# Patient Record
Sex: Female | Born: 1945 | Race: White | Hispanic: No | Smoking: Never smoker
Health system: Southern US, Community
[De-identification: ages and names within clinical notes are randomized; demographics above are authoritative.]

## PROBLEM LIST (undated history)

## (undated) DIAGNOSIS — I4891 Unspecified atrial fibrillation: Principal | ICD-10-CM

## (undated) DIAGNOSIS — G43909 Migraine, unspecified, not intractable, without status migrainosus: Secondary | ICD-10-CM

## (undated) DIAGNOSIS — E785 Hyperlipidemia, unspecified: Secondary | ICD-10-CM

## (undated) DIAGNOSIS — G40909 Epilepsy, unspecified, not intractable, without status epilepticus: Secondary | ICD-10-CM

## (undated) DIAGNOSIS — Z853 Personal history of malignant neoplasm of breast: Secondary | ICD-10-CM

## (undated) DIAGNOSIS — E039 Hypothyroidism, unspecified: Secondary | ICD-10-CM

## (undated) DIAGNOSIS — R32 Unspecified urinary incontinence: Secondary | ICD-10-CM

## (undated) DIAGNOSIS — E876 Hypokalemia: Secondary | ICD-10-CM

## (undated) DIAGNOSIS — G4700 Insomnia, unspecified: Secondary | ICD-10-CM

## (undated) DIAGNOSIS — R000 Tachycardia, unspecified: Secondary | ICD-10-CM

## (undated) DIAGNOSIS — F411 Generalized anxiety disorder: Secondary | ICD-10-CM

## (undated) DIAGNOSIS — C50919 Malignant neoplasm of unspecified site of unspecified female breast: Secondary | ICD-10-CM

## (undated) DIAGNOSIS — C73 Malignant neoplasm of thyroid gland: Secondary | ICD-10-CM

## (undated) DIAGNOSIS — C55 Malignant neoplasm of uterus, part unspecified: Secondary | ICD-10-CM

## (undated) DIAGNOSIS — K219 Gastro-esophageal reflux disease without esophagitis: Secondary | ICD-10-CM

## (undated) DIAGNOSIS — E209 Hypoparathyroidism, unspecified: Secondary | ICD-10-CM

## (undated) DIAGNOSIS — F0150 Vascular dementia without behavioral disturbance: Secondary | ICD-10-CM

## (undated) DIAGNOSIS — I340 Nonrheumatic mitral (valve) insufficiency: Secondary | ICD-10-CM

## (undated) DIAGNOSIS — I456 Pre-excitation syndrome: Secondary | ICD-10-CM

## (undated) HISTORY — DX: Hyperlipidemia, unspecified: E78.5

## (undated) HISTORY — DX: Personal history of malignant neoplasm of breast: Z85.3

## (undated) HISTORY — DX: Epilepsy, unspecified, not intractable, without status epilepticus: G40.909

## (undated) HISTORY — DX: Hypothyroidism, unspecified: E03.9

## (undated) HISTORY — DX: Vascular dementia, unspecified severity, without behavioral disturbance, psychotic disturbance, mood disturbance, and anxiety: F01.50

## (undated) HISTORY — DX: Pre-excitation syndrome: I45.6

## (undated) HISTORY — DX: Gastro-esophageal reflux disease without esophagitis: K21.9

## (undated) HISTORY — DX: Migraine, unspecified, not intractable, without status migrainosus: G43.909

## (undated) HISTORY — DX: Hypoparathyroidism, unspecified: E20.9

## (undated) HISTORY — PX: NECK SURGERY: SHX720

## (undated) HISTORY — DX: Generalized anxiety disorder: F41.1

## (undated) HISTORY — DX: Malignant neoplasm of thyroid gland: C73

## (undated) HISTORY — DX: Insomnia, unspecified: G47.00

## (undated) HISTORY — DX: Tachycardia, unspecified: R00.0

## (undated) HISTORY — DX: Unspecified urinary incontinence: R32

## (undated) HISTORY — DX: Nonrheumatic mitral (valve) insufficiency: I34.0

## (undated) HISTORY — PX: ABDOMINAL HYSTERECTOMY: SHX81

## (undated) HISTORY — DX: Hypokalemia: E87.6

---

## 2011-10-26 DIAGNOSIS — Z853 Personal history of malignant neoplasm of breast: Secondary | ICD-10-CM | POA: Diagnosis not present

## 2012-02-08 DIAGNOSIS — E209 Hypoparathyroidism, unspecified: Secondary | ICD-10-CM | POA: Diagnosis not present

## 2012-02-08 DIAGNOSIS — R7989 Other specified abnormal findings of blood chemistry: Secondary | ICD-10-CM | POA: Diagnosis not present

## 2012-02-08 DIAGNOSIS — D649 Anemia, unspecified: Secondary | ICD-10-CM | POA: Diagnosis not present

## 2012-02-09 DIAGNOSIS — D649 Anemia, unspecified: Secondary | ICD-10-CM | POA: Diagnosis not present

## 2012-03-19 DIAGNOSIS — M1710 Unilateral primary osteoarthritis, unspecified knee: Secondary | ICD-10-CM | POA: Diagnosis not present

## 2012-04-05 DIAGNOSIS — M1710 Unilateral primary osteoarthritis, unspecified knee: Secondary | ICD-10-CM | POA: Diagnosis not present

## 2012-04-15 DIAGNOSIS — R5383 Other fatigue: Secondary | ICD-10-CM | POA: Diagnosis not present

## 2012-04-15 DIAGNOSIS — R32 Unspecified urinary incontinence: Secondary | ICD-10-CM | POA: Diagnosis not present

## 2012-04-15 DIAGNOSIS — R5381 Other malaise: Secondary | ICD-10-CM | POA: Diagnosis not present

## 2012-04-15 DIAGNOSIS — I456 Pre-excitation syndrome: Secondary | ICD-10-CM | POA: Diagnosis not present

## 2012-04-15 DIAGNOSIS — F319 Bipolar disorder, unspecified: Secondary | ICD-10-CM | POA: Diagnosis not present

## 2012-04-15 DIAGNOSIS — I10 Essential (primary) hypertension: Secondary | ICD-10-CM | POA: Diagnosis not present

## 2012-04-18 DIAGNOSIS — M1710 Unilateral primary osteoarthritis, unspecified knee: Secondary | ICD-10-CM | POA: Diagnosis not present

## 2012-04-25 DIAGNOSIS — E039 Hypothyroidism, unspecified: Secondary | ICD-10-CM | POA: Diagnosis not present

## 2012-04-25 DIAGNOSIS — I10 Essential (primary) hypertension: Secondary | ICD-10-CM | POA: Diagnosis not present

## 2012-04-25 DIAGNOSIS — Z01810 Encounter for preprocedural cardiovascular examination: Secondary | ICD-10-CM | POA: Diagnosis not present

## 2012-05-12 DIAGNOSIS — T50901A Poisoning by unspecified drugs, medicaments and biological substances, accidental (unintentional), initial encounter: Secondary | ICD-10-CM | POA: Diagnosis not present

## 2012-05-12 DIAGNOSIS — R4182 Altered mental status, unspecified: Secondary | ICD-10-CM | POA: Diagnosis not present

## 2012-05-12 DIAGNOSIS — Z8719 Personal history of other diseases of the digestive system: Secondary | ICD-10-CM | POA: Diagnosis not present

## 2012-05-12 DIAGNOSIS — E039 Hypothyroidism, unspecified: Secondary | ICD-10-CM | POA: Diagnosis not present

## 2012-05-12 DIAGNOSIS — F329 Major depressive disorder, single episode, unspecified: Secondary | ICD-10-CM | POA: Diagnosis not present

## 2012-05-12 DIAGNOSIS — I10 Essential (primary) hypertension: Secondary | ICD-10-CM | POA: Diagnosis not present

## 2012-05-12 DIAGNOSIS — K219 Gastro-esophageal reflux disease without esophagitis: Secondary | ICD-10-CM | POA: Diagnosis not present

## 2012-05-12 DIAGNOSIS — R079 Chest pain, unspecified: Secondary | ICD-10-CM | POA: Diagnosis not present

## 2012-05-12 DIAGNOSIS — Z87891 Personal history of nicotine dependence: Secondary | ICD-10-CM | POA: Diagnosis not present

## 2012-05-12 DIAGNOSIS — F410 Panic disorder [episodic paroxysmal anxiety] without agoraphobia: Secondary | ICD-10-CM | POA: Diagnosis not present

## 2012-05-12 DIAGNOSIS — F29 Unspecified psychosis not due to a substance or known physiological condition: Secondary | ICD-10-CM | POA: Diagnosis not present

## 2012-05-25 DIAGNOSIS — R5383 Other fatigue: Secondary | ICD-10-CM | POA: Diagnosis not present

## 2012-05-25 DIAGNOSIS — Z8719 Personal history of other diseases of the digestive system: Secondary | ICD-10-CM | POA: Diagnosis not present

## 2012-05-25 DIAGNOSIS — F329 Major depressive disorder, single episode, unspecified: Secondary | ICD-10-CM | POA: Diagnosis not present

## 2012-05-25 DIAGNOSIS — R5381 Other malaise: Secondary | ICD-10-CM | POA: Diagnosis not present

## 2012-05-25 DIAGNOSIS — F410 Panic disorder [episodic paroxysmal anxiety] without agoraphobia: Secondary | ICD-10-CM | POA: Diagnosis not present

## 2012-05-25 DIAGNOSIS — I10 Essential (primary) hypertension: Secondary | ICD-10-CM | POA: Diagnosis not present

## 2012-05-25 DIAGNOSIS — E039 Hypothyroidism, unspecified: Secondary | ICD-10-CM | POA: Diagnosis not present

## 2012-05-25 DIAGNOSIS — K219 Gastro-esophageal reflux disease without esophagitis: Secondary | ICD-10-CM | POA: Diagnosis not present

## 2012-05-25 DIAGNOSIS — R569 Unspecified convulsions: Secondary | ICD-10-CM | POA: Diagnosis not present

## 2012-05-28 DIAGNOSIS — I69919 Unspecified symptoms and signs involving cognitive functions following unspecified cerebrovascular disease: Secondary | ICD-10-CM | POA: Diagnosis not present

## 2012-06-19 DIAGNOSIS — M1710 Unilateral primary osteoarthritis, unspecified knee: Secondary | ICD-10-CM | POA: Diagnosis not present

## 2012-06-19 DIAGNOSIS — F0150 Vascular dementia without behavioral disturbance: Secondary | ICD-10-CM | POA: Diagnosis not present

## 2012-06-19 DIAGNOSIS — E209 Hypoparathyroidism, unspecified: Secondary | ICD-10-CM | POA: Diagnosis not present

## 2012-06-19 DIAGNOSIS — G40309 Generalized idiopathic epilepsy and epileptic syndromes, not intractable, without status epilepticus: Secondary | ICD-10-CM | POA: Diagnosis not present

## 2012-06-19 DIAGNOSIS — Z23 Encounter for immunization: Secondary | ICD-10-CM | POA: Diagnosis not present

## 2012-06-20 DIAGNOSIS — Z23 Encounter for immunization: Secondary | ICD-10-CM | POA: Diagnosis not present

## 2012-07-02 DIAGNOSIS — E039 Hypothyroidism, unspecified: Secondary | ICD-10-CM | POA: Diagnosis not present

## 2012-07-02 DIAGNOSIS — I10 Essential (primary) hypertension: Secondary | ICD-10-CM | POA: Diagnosis not present

## 2012-07-02 DIAGNOSIS — Z87891 Personal history of nicotine dependence: Secondary | ICD-10-CM | POA: Diagnosis not present

## 2012-07-02 DIAGNOSIS — F329 Major depressive disorder, single episode, unspecified: Secondary | ICD-10-CM | POA: Diagnosis not present

## 2012-07-02 DIAGNOSIS — R569 Unspecified convulsions: Secondary | ICD-10-CM | POA: Diagnosis not present

## 2012-07-02 DIAGNOSIS — G43909 Migraine, unspecified, not intractable, without status migrainosus: Secondary | ICD-10-CM | POA: Diagnosis not present

## 2012-07-02 DIAGNOSIS — F410 Panic disorder [episodic paroxysmal anxiety] without agoraphobia: Secondary | ICD-10-CM | POA: Diagnosis not present

## 2012-07-02 DIAGNOSIS — Z8719 Personal history of other diseases of the digestive system: Secondary | ICD-10-CM | POA: Diagnosis not present

## 2012-07-02 DIAGNOSIS — K219 Gastro-esophageal reflux disease without esophagitis: Secondary | ICD-10-CM | POA: Diagnosis not present

## 2012-08-15 DIAGNOSIS — K219 Gastro-esophageal reflux disease without esophagitis: Secondary | ICD-10-CM | POA: Diagnosis not present

## 2012-08-15 DIAGNOSIS — R000 Tachycardia, unspecified: Secondary | ICD-10-CM | POA: Diagnosis not present

## 2012-08-15 DIAGNOSIS — E209 Hypoparathyroidism, unspecified: Secondary | ICD-10-CM | POA: Diagnosis not present

## 2012-08-15 DIAGNOSIS — Z853 Personal history of malignant neoplasm of breast: Secondary | ICD-10-CM | POA: Diagnosis not present

## 2012-08-15 DIAGNOSIS — E039 Hypothyroidism, unspecified: Secondary | ICD-10-CM | POA: Diagnosis not present

## 2012-08-15 DIAGNOSIS — C73 Malignant neoplasm of thyroid gland: Secondary | ICD-10-CM | POA: Diagnosis not present

## 2012-08-15 DIAGNOSIS — G40909 Epilepsy, unspecified, not intractable, without status epilepticus: Secondary | ICD-10-CM | POA: Diagnosis not present

## 2012-08-15 DIAGNOSIS — F0150 Vascular dementia without behavioral disturbance: Secondary | ICD-10-CM | POA: Diagnosis not present

## 2012-09-04 ENCOUNTER — Encounter: Payer: Self-pay | Admitting: Cardiology

## 2012-09-09 DIAGNOSIS — G40909 Epilepsy, unspecified, not intractable, without status epilepticus: Secondary | ICD-10-CM | POA: Diagnosis not present

## 2012-09-09 DIAGNOSIS — E559 Vitamin D deficiency, unspecified: Secondary | ICD-10-CM | POA: Diagnosis not present

## 2012-09-09 DIAGNOSIS — E039 Hypothyroidism, unspecified: Secondary | ICD-10-CM | POA: Diagnosis not present

## 2012-09-09 DIAGNOSIS — C73 Malignant neoplasm of thyroid gland: Secondary | ICD-10-CM | POA: Diagnosis not present

## 2012-09-09 DIAGNOSIS — E876 Hypokalemia: Secondary | ICD-10-CM | POA: Diagnosis not present

## 2012-09-09 DIAGNOSIS — E538 Deficiency of other specified B group vitamins: Secondary | ICD-10-CM | POA: Diagnosis not present

## 2012-09-09 DIAGNOSIS — F0150 Vascular dementia without behavioral disturbance: Secondary | ICD-10-CM | POA: Diagnosis not present

## 2012-09-09 DIAGNOSIS — R000 Tachycardia, unspecified: Secondary | ICD-10-CM | POA: Diagnosis not present

## 2012-09-16 DIAGNOSIS — F0150 Vascular dementia without behavioral disturbance: Secondary | ICD-10-CM | POA: Diagnosis not present

## 2012-09-16 DIAGNOSIS — E209 Hypoparathyroidism, unspecified: Secondary | ICD-10-CM | POA: Diagnosis not present

## 2012-09-16 DIAGNOSIS — G40909 Epilepsy, unspecified, not intractable, without status epilepticus: Secondary | ICD-10-CM | POA: Diagnosis not present

## 2012-09-16 DIAGNOSIS — K219 Gastro-esophageal reflux disease without esophagitis: Secondary | ICD-10-CM | POA: Diagnosis not present

## 2012-09-16 DIAGNOSIS — Z853 Personal history of malignant neoplasm of breast: Secondary | ICD-10-CM | POA: Diagnosis not present

## 2012-09-16 DIAGNOSIS — E039 Hypothyroidism, unspecified: Secondary | ICD-10-CM | POA: Diagnosis not present

## 2012-09-16 DIAGNOSIS — R000 Tachycardia, unspecified: Secondary | ICD-10-CM | POA: Diagnosis not present

## 2012-09-16 DIAGNOSIS — C73 Malignant neoplasm of thyroid gland: Secondary | ICD-10-CM | POA: Diagnosis not present

## 2012-09-19 ENCOUNTER — Encounter: Payer: Self-pay | Admitting: Cardiology

## 2012-09-19 ENCOUNTER — Encounter: Payer: Self-pay | Admitting: *Deleted

## 2012-09-19 ENCOUNTER — Ambulatory Visit (INDEPENDENT_AMBULATORY_CARE_PROVIDER_SITE_OTHER): Payer: Medicare Other | Admitting: Cardiology

## 2012-09-19 VITALS — BP 134/74 | HR 87 | Ht 61.25 in | Wt 170.4 lb

## 2012-09-19 DIAGNOSIS — R0989 Other specified symptoms and signs involving the circulatory and respiratory systems: Secondary | ICD-10-CM | POA: Diagnosis not present

## 2012-09-19 DIAGNOSIS — R0609 Other forms of dyspnea: Secondary | ICD-10-CM

## 2012-09-19 DIAGNOSIS — R079 Chest pain, unspecified: Secondary | ICD-10-CM | POA: Insufficient documentation

## 2012-09-19 DIAGNOSIS — R0600 Dyspnea, unspecified: Secondary | ICD-10-CM

## 2012-09-19 DIAGNOSIS — I456 Pre-excitation syndrome: Secondary | ICD-10-CM

## 2012-09-19 NOTE — Patient Instructions (Addendum)
Your physician recommends that you schedule a follow-up appointment in: 3 months. Your physician has recommended you make the following change in your medication: Start aspirin 81 mg daily. All other medications will remain the same. Your physician has requested that you have an echocardiogram. Echocardiography is a painless test that uses sound waves to create images of your heart. It provides your doctor with information about the size and shape of your heart and how well your heart's chambers and valves are working. This procedure takes approximately one hour. There are no restrictions for this procedure. Your physician has requested that you have a lexiscan myoview at the [facility]. office. For further information please visit [email or web address] Please follow instruction sheet, as given.

## 2012-09-19 NOTE — Progress Notes (Signed)
Patient ID: Samantha Odonnell, female   DOB: [birth year], 66 y.o.   MRN: [id number] PCP: Dr. Leandrew Koyanagi  66 yo with history of depression and chest pain presents for cardiology evaluation.  Patient has a questionable cardiac history.  She used to live near [place], [place] and saw a cardiologist there.  She says that she has had 4 "cardiac arrests."  These actually sound like seizures and her daughter says they were seizures.  She has moved to [place] to be near her daughter and is living at [facility]. Main cardiac complaint today is chest pressure.  She gets chest pressure periodically, 1-2 times a week.  It is central and will last for a few minutes until she takes NTG.  Chest pain resolves with NTG.  There is no trigger for the chest pain, it is not exertional.  She is short of breath walking up steps or walking a "long way" on flat ground.  She may need to have her right knee replaced due to OA.  She thinks she has had a cath in the past, sounds like several years ago.  She thinks it was normal.   ECG: NSR, normal  Labs (8/13): K 4.4, creatinine 0.74, HCT 29.6  PMH: 1. Depression 2. Panic disorder 3. GERD 4. Thyroid cancer s/p thyroidectomy, now hypothyroid.  5. H/o pancreatitis 6. Seizure disorder 7. TAH 8. Laminectomy 9. Anemia 10. Osteoarthritis, especially affecting knees.   SH: Lives in [place] at [facility].  Daughter lives in [place] as well.  Nonsmoker.    FH: Father with CABG at 22  ROS: All systems reviewed and negative except as per HPI.   Current Outpatient Prescriptions  Medication Sig Dispense Refill  . acetaminophen (TYLENOL) 500 MG tablet Take 500 mg by mouth 3 (three) times daily as needed.      Marland Kitchen aspirin EC 81 MG tablet Take 81 mg by mouth daily.      . calcium-vitamin D (OSCAL WITH D) 500-200 MG-UNIT per tablet Take 1 tablet by mouth daily.      . clonazePAM (KLONOPIN) 0.5 MG tablet Take 0.5 mg by mouth 3 (three) times daily.      . DULoxetine (CYMBALTA) 60 MG capsule Take 60  mg by mouth 2 (two) times daily.      . ferrous sulfate 325 (65 FE) MG tablet Take 325 mg by mouth 2 (two) times daily.      Marland Kitchen levothyroxine (SYNTHROID, LEVOTHROID) 150 MCG tablet Take 150 mcg by mouth daily.      Marland Kitchen MAGNESIUM CHLORIDE ER PO Take by mouth.      . nitroGLYCERIN (NITROSTAT) 0.4 MG SL tablet Place 0.4 mg under the tongue every 5 (five) minutes as needed.      . nitroGLYCERIN 2.5 MG CR capsule Take 2.5 mg by mouth 2 (two) times daily.      Marland Kitchen PHENobarbital (LUMINAL) 32.4 MG tablet Take 32.4 mg by mouth 3 (three) times daily.      . potassium chloride (K-DUR) 10 MEQ tablet Take 10 mEq by mouth daily.      . promethazine (PHENERGAN) 25 MG tablet Take 25 mg by mouth every 6 (six) hours as needed.      . risperiDONE (RISPERDAL) 2 MG tablet Take 2 mg by mouth 2 (two) times daily.      . SUMAtriptan (IMITREX) 100 MG tablet Take 100 mg by mouth every 2 (two) hours as needed.      . tolterodine (DETROL LA) 4 MG  24 hr capsule Take 4 mg by mouth daily.      Marland Kitchen zolpidem (AMBIEN CR) 6.25 MG CR tablet Take 6.25 mg by mouth at bedtime.       . Vitamin D, Ergocalciferol, (DRISDOL) 50000 UNITS CAPS Take 50,000 Units by mouth.        BP 134/74  Pulse 87  Ht 5' 1.25" (1.556 m)  Wt 170 lb 6.4 oz (77.293 kg)  BMI 31.93 kg/m2 General: NAD Neck: No JVD, no thyromegaly or thyroid nodule.  Lungs: Clear to auscultation bilaterally with normal respiratory effort. CV: Nondisplaced PMI.  Heart regular S1/S2, no S3/S4, no murmur.  No peripheral edema.  No carotid bruit.  Normal pedal pulses.  Abdomen: Soft, nontender, no hepatosplenomegaly, no distention.  Skin: Intact without lesions or rashes.  Neurologic: Alert and oriented x 3.  Psych: Normal affect. Extremities: No clubbing or cyanosis.  HEENT: Normal.   Assessment/Plan: Chest pain: Atypical but relieved by NTG.  It sounds like she has had some form of cardiac workup several years ago, maybe even with a cath.  She is on no cardiac meds.  She  insists she has had 4 cardiac arrests but her daughter says they were seizures.  She is on phenobarbital.  - Echo given dyspnea and history of ?cardiac arrest.  - Lexiscan myoview to assess for ischemia given chest pain relieved by NTG.  - She should take ASA 81 mg daily.   Marca Ancona 09/19/2012

## 2012-09-20 ENCOUNTER — Other Ambulatory Visit: Payer: Self-pay | Admitting: Cardiology

## 2012-09-20 MED ORDER — ASPIRIN EC 81 MG PO TBEC: 81.0000 mg | DELAYED_RELEASE_TABLET | Freq: Every day | ORAL | Status: DC

## 2012-09-25 ENCOUNTER — Other Ambulatory Visit (INDEPENDENT_AMBULATORY_CARE_PROVIDER_SITE_OTHER): Payer: Medicare Other

## 2012-09-25 ENCOUNTER — Other Ambulatory Visit: Payer: Self-pay

## 2012-09-25 DIAGNOSIS — R072 Precordial pain: Secondary | ICD-10-CM | POA: Diagnosis not present

## 2012-09-25 DIAGNOSIS — R079 Chest pain, unspecified: Secondary | ICD-10-CM

## 2012-09-26 ENCOUNTER — Ambulatory Visit (HOSPITAL_COMMUNITY): Payer: Medicare Other | Attending: Cardiology | Admitting: Radiology

## 2012-09-26 VITALS — BP 134/69 | Ht 61.0 in | Wt 170.0 lb

## 2012-09-26 DIAGNOSIS — Z9221 Personal history of antineoplastic chemotherapy: Secondary | ICD-10-CM | POA: Insufficient documentation

## 2012-09-26 DIAGNOSIS — R079 Chest pain, unspecified: Secondary | ICD-10-CM | POA: Diagnosis not present

## 2012-09-26 DIAGNOSIS — R0989 Other specified symptoms and signs involving the circulatory and respiratory systems: Secondary | ICD-10-CM | POA: Insufficient documentation

## 2012-09-26 DIAGNOSIS — R0609 Other forms of dyspnea: Secondary | ICD-10-CM | POA: Insufficient documentation

## 2012-09-26 DIAGNOSIS — R0789 Other chest pain: Secondary | ICD-10-CM | POA: Diagnosis not present

## 2012-09-26 DIAGNOSIS — R0600 Dyspnea, unspecified: Secondary | ICD-10-CM

## 2012-09-26 MED ORDER — AMINOPHYLLINE 25 MG/ML IV SOLN
75.0000 mg | Freq: Once | INTRAVENOUS | Status: AC
  Administered 2012-09-26: 75 mg via INTRAVENOUS

## 2012-09-26 MED ORDER — TECHNETIUM TC 99M SESTAMIBI GENERIC - CARDIOLITE
10.0000 | Freq: Once | INTRAVENOUS | Status: AC | PRN
  Administered 2012-09-26: 10 via INTRAVENOUS

## 2012-09-26 MED ORDER — TECHNETIUM TC 99M SESTAMIBI GENERIC - CARDIOLITE
33.0000 | Freq: Once | INTRAVENOUS | Status: AC | PRN
  Administered 2012-09-26: 33 via INTRAVENOUS

## 2012-09-26 MED ORDER — REGADENOSON 0.4 MG/5ML IV SOLN
0.4000 mg | Freq: Once | INTRAVENOUS | Status: AC
  Administered 2012-09-26: 0.4 mg via INTRAVENOUS

## 2012-09-26 NOTE — Progress Notes (Addendum)
[facility] SITE 3 NUCLEAR MED [address] [place], [place] [address] [phone]    Cardiology Nuclear Med Study  Samantha Odonnell is a 66 y.o. female     MRN : [id number]     DOB: 10/08/1946  Procedure Date: 09/26/2012  Nuclear Med Background Indication for Stress Test:  Evaluation for Ischemia History:  10-15 yrs ago GXT: Nl per pt, 2006: H/O Chemo, 2012: Nl in [place] 12/13 ECHO: [facility]  Cardiac Risk Factors: Family History - CAD  Symptoms:  Chest Pain, Chest Pressure.  (last date of chest discomfort ) and DOE   Nuclear Pre-Procedure Caffeine/Decaff Intake:  None NPO After: 5:30 pm   Lungs:  clear O2 Sat: 96% on room air. IV 0.9% NS with Angio Cath:  24g  IV Site: L Wrist  IV Started by:  Doyne Keel, CNMT  Chest Size (in):  38  Cup Size: B  Height: 5\' 1"  (1.549 m)  Weight:  170 lb (77.111 kg)  BMI:  Body mass index is 32.12 kg/(m^2). Tech Comments: N/A    Nuclear Med Study 1 or 2 day study: 1 day  Stress Test Type:  Lexiscan  Reading MD: Olga Millers, MD  Order Authorizing Provider:  Marca Ancona, MD  Resting Radionuclide: Technetium 76m Sestamibi  Resting Radionuclide Dose: 10.9 mCi   Stress Radionuclide:  Technetium 14m Sestamibi  Stress Radionuclide Dose: 33.0 mCi           Stress Protocol Rest HR: 76 Stress HR: 94  Rest BP: 134/69 Stress BP: 134/69  Exercise Time (min): n/a METS: n/a   Predicted Max HR: 152 bpm % Max HR: 61.84 bpm Rate Pressure Product: 21308    Dose of Adenosine (mg):  n/a Dose of Lexiscan: 0.4 mg  Dose of Atropine (mg): n/a Dose of Dobutamine: n/a mcg/kg/min (at max HR)  Stress Test Technologist: Milana Na, EMT-P  Nuclear Technologist:  Domenic Polite, CNMT     Rest Procedure:  Myocardial perfusion imaging was performed at rest 45 minutes following the intravenous administration of Technetium 60m Sestamibi. Rest ECG: NSR - Normal EKG  Stress Procedure:  The patient received IV Lexiscan 0.4 mg over 15-seconds.   Technetium 57m Sestamibi injected at 30-seconds.  Quantitative spect images were obtained after a 45 minute delay. This patient was very symptomatic with sob, chest heaviness, fatigue, and abdominal discomfort. Stress ECG: No significant ST segment change suggestive of ischemia.  QPS Raw Data Images:  Acquisition technically good; normal left ventricular size. Stress Images:  There is decreased uptake in the anterior wall and apex. Rest Images:  There is decreased uptake in the anterior wall, less prominent compared to the stress images. Subtraction (SDS):  These findings are consistent with soft tissue attenuation and mild ischemia. Transient Ischemic Dilatation (Normal <1.22):  1.11 Lung/Heart Ratio (Normal <0.45):  0.28  Quantitative Gated Spect Images QGS EDV:  78 ml QGS ESV:  25 ml  Impression Exercise Capacity:  Lexiscan with no exercise. BP Response:  Normal blood pressure response. Clinical Symptoms:  There is chest pain and dyspnea. ECG Impression:  No significant ST segment change suggestive of ischemia. Comparison with Prior Nuclear Study: No images to compare  Overall Impression:  Low risk stress nuclear study with a small, medium intensity, partially reversible anterior/apical defect consistent with soft tissue attenuation and mild ischemia.  LV Ejection Fraction: 68%.  LV Wall Motion:  NL LV Function; NL Wall Motion   Olga Millers  Possible mild anterior ischemia.  Needs to be seen back in the office to discuss.   Marca Ancona 09/27/2012 9:37 AM

## 2012-09-27 ENCOUNTER — Telehealth: Payer: Self-pay | Admitting: *Deleted

## 2012-09-27 NOTE — Telephone Encounter (Signed)
Notes Recorded by Lesle Chris, LPN on 16/07/9603 at 11:08 AM Daughter Victorino Dike) notified.

## 2012-09-27 NOTE — Telephone Encounter (Signed)
Message copied by Lesle Chris on Fri Sep 27, 2012 11:08 AM ------      Message from: Laurey Morale      Created: Fri Sep 27, 2012  8:30 AM       Normal EF, moderate eccentric MR, mild pulmonary hypertension

## 2012-10-15 ENCOUNTER — Ambulatory Visit: Payer: Medicare Other | Admitting: Internal Medicine

## 2012-10-21 ENCOUNTER — Encounter: Payer: Self-pay | Admitting: Internal Medicine

## 2012-10-21 ENCOUNTER — Ambulatory Visit (INDEPENDENT_AMBULATORY_CARE_PROVIDER_SITE_OTHER): Payer: Medicare Other | Admitting: Internal Medicine

## 2012-10-21 VITALS — BP 114/74 | HR 74 | Wt 177.0 lb

## 2012-10-21 DIAGNOSIS — R635 Abnormal weight gain: Secondary | ICD-10-CM

## 2012-10-21 DIAGNOSIS — E209 Hypoparathyroidism, unspecified: Secondary | ICD-10-CM | POA: Diagnosis not present

## 2012-10-21 DIAGNOSIS — C73 Malignant neoplasm of thyroid gland: Secondary | ICD-10-CM

## 2012-10-21 DIAGNOSIS — R358 Other polyuria: Secondary | ICD-10-CM

## 2012-10-21 DIAGNOSIS — E892 Postprocedural hypoparathyroidism: Secondary | ICD-10-CM

## 2012-10-21 DIAGNOSIS — S92912A Unspecified fracture of left toe(s), initial encounter for closed fracture: Secondary | ICD-10-CM

## 2012-10-21 DIAGNOSIS — Z13820 Encounter for screening for osteoporosis: Secondary | ICD-10-CM

## 2012-10-21 DIAGNOSIS — S92919A Unspecified fracture of unspecified toe(s), initial encounter for closed fracture: Secondary | ICD-10-CM

## 2012-10-21 LAB — TSH: TSH: 0.09 u[IU]/mL — ABNORMAL LOW (ref 0.35–5.50)

## 2012-10-21 LAB — BASIC METABOLIC PANEL
CO2: 21 mEq/L (ref 19–32)
Calcium: 7.5 mg/dL — ABNORMAL LOW (ref 8.4–10.5)
Creatinine, Ser: 0.9 mg/dL (ref 0.4–1.2)

## 2012-10-21 LAB — MAGNESIUM: Magnesium: 1.5 mg/dL (ref 1.5–2.5)

## 2012-10-21 NOTE — Progress Notes (Signed)
Subjective:     Patient ID: Samantha Odonnell, female   DOB: [birth year], 67 y.o.   MRN: [id number]  HPI  Samantha Odonnell is a pleasant 67 year old woman referred from her primary care physician, Dr. 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 Her daughter accompanies her today and offers part of the history.  History of thyroid cancer: Pt with diagnosed with thyroid cancer when she was in the hospital for tonsillitis, found to have a thyroid nodule. 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 She had radioactive iodine I131 treatment at that time. She was followed by Dr. Shirline Frees and remembers that her disease was well controlled, with normal thyroglobulin levels and ultrasounds. She had several WBS's after that. However, at the [facility] retired in 1986 or 87, and the patient has not been seen since. She was initially taking desiccated thyroid, then Synthroid generic. 150 - dose not being changed in a long time. She feels no lumps or bumps in neck. Lateness thyroid function tests showed a TSH of 1.86 and a free T4 of 1.41.  The patient's parathyroid glands were surgically affected or removed (I do not have the surgical report,  latest PTH level was 8 for a calcium of 9.3), and the patient was severely hypocalcemic after the surgery and she required intravenous calcium administration for a long time. She was admitted several times in the past with either hypo- or hypercalcemia. She has been recently controlled on 2000 mg by mouth calcium with the recent calcium level of 9.3, obtained in the 09/09/2012 by PCP. She mentions that the reason why her calcium became well controlled recently is the addition of vitamin D. She has been taking 5000 units 3 times a day along with calcium  plus vitamin D for some time, before a vitamin D level checked by PCP a month ago was 297. At that time she was told to discontinue any vitamin D by mouth. She believes that he has she was on 2000 units 4 times a day and worked well for her. She cannot remember why and how her dose was changed. From her records, I see that she was on 50,000 units of ergocalciferol 3 times a week in the past.  She does mention that in the last 2 days she has had none in her hands and tingling in her face similar to when she had low calcium before. She can feel her face twitching and feels "bugs under the skin" when calcium is low. She has that sensation every once in a while - also her hand and feet get clawed. She she mentions she cheese daily and drinks milk and eggnog. She eats a lettuce salad every other day, but not green leafy vegetables. She lives in [facility] in [place].   She remembers that she had a fractured wrist in 1962. She had fracture left toe about 6 years ago. She had hysterectomy at 67 y/o for "uterine problems". She had a DEXA scan long time ago, does not remember being told she has osteoporosis. She will have teeth out (mandibular), but no reconstructive surgery planned. She has hiatal hernia and chest discomfort from this. She has had steroid injections in the past in her knees and shoulder 3 years ago. Last was 8  mo ago (thumb).  The patient and her diet are concerned about the patient's weight gain, of 25 pounds in the last 3 months. She is thirsty and chains sweetened tee. She urinates more than before.   The patient also has a history of low potassium and low Mg, but now on Kdur 10 mEq. No recent hypokalemia. She has been on magnesium supplements in the past, not anymore. Her most recent potassium was 4.7 (3.5-5.2) and magnesium was 1.4 (1.6-2.6) on 09/09/2012.  I reviewed the records from Dr. Cato Mulligan office. The patient has a complex, extensive, medical history including  Wolff-Parkinson-White syndrome (for which she sees cardiology), a history of seizure disorders with last seizure in November 2013, history of migraine headaches (which apparently trigger the seizures), history of breast cancer diagnosed in 2005 status post lumpectomy and radiation therapy, anxiety and depression, hiatal hernia, GERD, iron deficiency anemia, chronic chest pain (on nitroglycerin when necessary), urinary incontinence (on Detrol LA), vascular dementia, bipolar ds., insomnia, fibromyalgia, restless leg syndrome. She has been admitted to the psychiatric unit in the past however, her daughter, she is doing much better now that she is off narcotics. Shows to have a history of laminectomy, history of kidney stones.  Review imaging test reports: CT abdomen and pelvis (05/2011) was normal except for a large hiatal hernia. Specifically there were no adrenal masses. She had several chest x-rays from that same month that were normal. 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 She also her had an x-ray of the right foot that show diffuse osteopenia. Lumbar spine CT (11/2009) showed changes from previous laminectomy but no lytic or sclerotic lesions. An MRI of the brain (01/2005) was negative for metastases. A bone scan from the same month showed a large amount of tissue activity over the right breast, and an increased focus of activity over the right knee and humerus, which was not seen on an x-ray of the right humerus one day later. However the radiologist commended that the bone scan is more sensitive. A mammogram (03/2010) with stable, benign, consistent with 2009.  Past Medical History  Diagnosis Date  . Vascular dementia, uncomplicated   . Unspecified epilepsy without mention of intractable epilepsy   . Tachycardia, unspecified   . Hypoparathyroidism   . Malignant neoplasm of thyroid gland   . Personal history of malignant neoplasm of breast   . Unspecified hypothyroidism     . Esophageal reflux   . Unspecified urinary incontinence   . Insomnia, unspecified   . Migraine   . Anxiety state, unspecified   . Hypopotassemia   . Wolff-Parkinson-White (WPW) syndrome    Past surgical history-see history of present illness  History   Social History  . Marital Status: Widowed    Spouse Name: N/A    Number of Children: 4  . Years of Education: N/A   Occupational History  . retired   Social History Main Topics  . Smoking status: Never Smoker   . Smokeless tobacco: No  . Alcohol Use: No  . Drug Use: No   Social History Narrative  . Lives in [place]   Current Outpatient Prescriptions on File Prior to Visit  Medication Sig Dispense Refill  . acetaminophen (TYLENOL) 500 MG tablet Take 500 mg by mouth 3 (three) times daily as needed.      Marland Kitchen aspirin EC 81 MG tablet Take 1 tablet (81 mg total) by mouth daily.  30 tablet  6  . calcium-vitamin  D (OSCAL WITH D) 500-200 MG-UNIT per tablet Take 1 tablet by mouth daily.      . clonazePAM (KLONOPIN) 0.5 MG tablet Take 0.5 mg by mouth 3 (three) times daily.      . DULoxetine (CYMBALTA) 60 MG capsule Take 60 mg by mouth 2 (two) times daily.      . ferrous sulfate 325 (65 FE) MG tablet Take 325 mg by mouth 2 (two) times daily.      Marland Kitchen levothyroxine (SYNTHROID, LEVOTHROID) 150 MCG tablet Take 150 mcg by mouth daily.      Marland Kitchen MAGNESIUM CHLORIDE ER PO Take by mouth.  NOT TAKING    . nitroGLYCERIN (NITROSTAT) 0.4 MG SL tablet Place 0.4 mg under the tongue every 5 (five) minutes as needed.      . nitroGLYCERIN 2.5 MG CR capsule Take 2.5 mg by mouth 2 (two) times daily.      Marland Kitchen PHENobarbital (LUMINAL) 32.4 MG tablet Take 32.4 mg by mouth 3 (three) times daily.      . potassium chloride (K-DUR) 10 MEQ tablet Take 10 mEq by mouth daily.      . promethazine (PHENERGAN) 25 MG tablet Take 25 mg by mouth every 6 (six) hours as needed.      . risperiDONE (RISPERDAL) 2 MG tablet Take 2 mg by mouth 2 (two) times daily.      .  SUMAtriptan (IMITREX) 100 MG tablet Take 100 mg by mouth every 2 (two) hours as needed.      . tolterodine (DETROL LA) 4 MG 24 hr capsule Take 4 mg by mouth daily.      . Vitamin D, Ergocalciferol, (DRISDOL) 50000 UNITS CAPS Take 50,000 Units by mouth.      . zolpidem (AMBIEN CR) 6.25 MG CR tablet Take 6.25 mg by mouth at bedtime.         Allergies  Allergen Reactions  . Adhesive (Tape)   . Cyclobenzaprine   . Imdur (Isosorbide)     Headache, and rash   . Iodine   . Pentazocine   . Propoxyphene And Methadone   . Septra (Sulfamethoxazole W-Trimethoprim)   . Sulfa Antibiotics   . Tegretol (Carbamazepine)   . Tetracyclines & Related    Family History  Problem Relation Age of Onset  . Cancer Mother   . Hyperlipidemia Father   . Cancer Father    Review of Systems Constitutional: + weight gain: 25 lbs in last 3 mo., +  Fatigue, + hot flushes, + poor sleep, + nocturia, + fever/chills Eyes: + blurry vision, no xerophthalmia ENT: + sore throat, no nodules palpated in throat, dysphagia- not new, + hoarseness Cardiovascular:  + CP/+ SOB/+ palpitations/leg swelling Respiratory: no cough/SOB Gastrointestinal: +N/+V/+D/no C Musculoskeletal: + muscle/+ joint aches Skin: no rashes, easy bruising Neurological: + tremor (mouth)/numbness/tingling/dizziness Psychiatric: + depression/+ anxiety    Objective:   Physical Exam BP 114/74  Pulse 74  Wt 177 lb (80.287 kg)  SpO2 96% Wt Readings from Last 3 Encounters:  10/21/12 177 lb (80.287 kg)  09/26/12 170 lb (77.111 kg)  09/19/12 170 lb 6.4 oz (77.293 kg)  Constitutional: overweight, in NAD. She appears forgetful but otherwise oriented and pleasant.  Eyes: PERRLA, EOMI, no exophthalmos ENT: moist mucous membranes, no thyromegaly,  no nodules palpated, no cervical lymphadenopathy. Anterior neck extensive scarring. Asymmetric aspect of neck after her surgery  Cardiovascular: RRR, No MRG Respiratory: CTA B Gastrointestinal: abdomen soft,  NT, ND, BS+ Musculoskeletal: no deformities, strength intact in all 4.  Chvostek sign negative. Skin: moist, warm, no rashes; very thin skin Neurological: no tremor with outstretched hands,  mild tremor in her upper lip. DTR normal in all 4  Assessment:     1. History of thyroid cancer - No records available  - Thyroidectomy at 14, complicated by hypoparathyroid - No Ultrasounds or thyroglobulin levels since 1980's - Thyroid tests normal   2. Postsurgical hypoparathyroidism - Calcium controlled lately on 2000 mg of calcium daily - Vitamin D recently very high at almost 300 >> patient advised to stop all vitamin D supplements approximately a month ago   3. Hypokalemia - Associated hypomagnesemia, patient not magnesium supplements - Taking potassium supplements (10 mEq daily) - CT abdomen from a year and a half ago was negative for adrenal mass   4. Weight gain    Plan:     1. History of thyroid cancer  - Diagnosed in the patient was 14, status post radical thyroidectomy then, also with reported metastases to lungs status post one I-131 treatment in the 1960's. She has been followed at [facility] and we will try to obtain the records. - For now, I would not change the dose of her levothyroxine since her thyroid function tests are normal. She is too far out from her cancer, to aim for a suppressed TSH. - Will check a thyroglobulin level along with thyroglobulin antibodies - I do not feel concerning masses in her neck, however the architecture is distorted due to her surgery. I will order an ultrasound of her neck for now  2. Hypoparathyroidism - The patient has an extensive history of hypocalcemia, with hospitalizations for both hypo-and hypercalcemia. She appears to be on a good regimen of daily calcium, however she was taking way too much vitamin D. - We'll recheck a calcium and a vitamin D today and I expect that we need to restart her on vitamin D supplementation. Depending on  level, we can probably start ergocalciferol 50,000 units weekly. - I gave her as if with dietary content of calcium and vitamin D and advised her to keep track of how much calcium and vitamin D she gets from food - She does not have a Chvostek sign today but will check her calcium level  - Due to her long history of hypocalcemia, her toe fracture 10 years ago, had decreased bone density on her most recent x-ray of the foot, her frequent steroid injections, and her history of early menopause, I suspect that she has osteoporosis and the patient and her daughter agree with the DEXA scan. I will order it.  3. Hypokalemia - If possible and if it's associated with her low magnesium and and I suggested that she start on magnesium supplements at 400 or 500 mg daily. She does mention that she usually has more runny stools, and I advised her to cut the tablet in half if she cannot tolerate them. - We decided to continue to keep an eye on her potassium, while she continues to take her potassium supplement, and intervene if her potassium drops again  4. Weight gain - Patient has a weight gain of 25 pounds in the last 3 months, along with drinking more sweet beverages, being more thirsty and urinating more. Her latest glucose was 90 last month and I would like to order a hemoglobin A1c however this is not covered by her insurance with any of the associated diagnoses that I tried. I suggested to the daughter to ask Dr. Leandrew Koyanagi to obtain one  at the next visit.  I would see the patient back in 2 months and will recheck her labs then.  Office Visit on 10/21/2012  Component Date Value Range Status  . TSH 10/21/2012 0.09* 0.35 - 5.50 uIU/mL Final  . Thyroglobulin 10/21/2012 <0.2  0.0 - 55.0 ng/mL Final   Comment:                            Thyroglobulin Autoantibodies may interfere with assays for                          Thyroglobulin and may cause underestimation of the Thyroglobulin                           level.  If Thyroglobulin is ordered alone, please interpret with                          caution.  . Magnesium 10/21/2012 1.5  1.5 - 2.5 mg/dL Final  . Sodium 16/07/9603 134* 135 - 145 mEq/L Final  . Potassium 10/21/2012 4.2  3.5 - 5.1 mEq/L Final  . Chloride 10/21/2012 103  96 - 112 mEq/L Final  . CO2 10/21/2012 21  19 - 32 mEq/L Final  . Glucose, Bld 10/21/2012 86  70 - 99 mg/dL Final  . BUN 54/06/8118 17  6 - 23 mg/dL Final  . Creatinine, Ser 10/21/2012 0.9  0.4 - 1.2 mg/dL Final  . Calcium 14/78/2956 7.5* 8.4 - 10.5 mg/dL Final  . GFR 21/30/8657 65.15  >60.00 mL/min Final  . Vit D, 25-Hydroxy 10/21/2012 93* 30 - 89 ng/mL Final   Comment: This assay accurately quantifies Vitamin D, which is the sum of the                          25-Hydroxy forms of Vitamin D2 and D3.  Studies have shown that the                          optimum concentration of 25-Hydroxy Vitamin D is 30 ng/mL or higher.                           Concentrations of Vitamin D between 20 and 29 ng/mL are considered to                          be insufficient and concentrations less than 20 ng/mL are considered                          to be deficient for Vitamin D.   Called pt and daughter and advised them to increase Calcium to 2500 mg daily and decrease Synthroid (using generic, will start brand name) from 150 to 125 mcg daily. I sent this to her pharmacy. We will start Ergocalciferol 50,000 units once weekly in 1 week. I sent this to her pharmacy, too. If calcium cont. to be low, I will consider Calcitriol, but since recently her Calcium was controlled on calcium and vit D, will try this regimen first. Thyroglobulin undetectable, which shows no ThyCa recurrence.   ATA still pending.

## 2012-10-21 NOTE — Patient Instructions (Signed)
Please return in 2 months for a visit and labs. Continue to take 2000 mg calcium daily. I will call you with the results of your labs and further plan regarding the vitamin D. Please take a 400 (or 500) mg magnesium tablet daily. We will call you with scheduling the DEXA scan and the thyroid ultrasound - if you are not called in maximum a week, please let us know.  Carlus Pavlov, MD PhD [facility] [address] [place], [place] [address] Tel. [phone] Fax [phone]    How Can I Prevent Falls? Men and women with osteoporosis need to take care not to fall down. Falls can break bones. Some reasons people fall are: 1. Poor vision  2. Poor balance  3. Certain diseases that affect how you walk  4. Some types of medicine, such as sleeping pills.  Some tips to help prevent falls outdoors are:   Use a cane or walker    Wear rubber-soled shoes so you don't slip    Walk on grass when sidewalks are slippery    In winter, put salt or kitty litter on icy sidewalks.  Some ways to help prevent falls indoors are:   Keep rooms free of clutter, especially on floors    Use plastic or carpet runners on slippery floors    Wear low-heeled shoes that provide good support    Do not walk in socks, stockings, or slippers    Be sure carpets and area rugs have skid-proof backs or are tacked to the floor    Be sure stairs are well lit and have rails on both sides    Put grab bars on bathroom walls near tub, shower, and toilet    Use a rubber bath mat in the shower or tub    Keep a flashlight next to your bed    Use a sturdy step stool with a handrail and wide steps    Add more lights in rooms (and night lights)   Buy a cordless phone to keep with you so that you don't have to rush to the phone when it rings and so that you can call for help if you fall.  (adapted from http://www.niams.HostessTraining.at)  Dietary sources of calcium and  vitamin D:  Calcium content (mg) - http://www.niams.https://www.gonzalez.org/  Fortified oatmeal, 1 packet 350  Sardines, canned in oil, with edible bones, 3 oz. 324  Cheddar cheese, 1 oz. shredded 306  Milk, nonfat, 1 cup 302  Milkshake, 1 cup 300  Yogurt, plain, low-fat, 1 cup 300  Soybeans, cooked, 1 cup 261  Tofu, firm, with calcium,  cup 204  Orange juice, fortified with calcium, 6 oz. 200-260 (varies)  Salmon, canned, with edible bones, 3 oz. 181  Pudding, instant, made with 2% milk,  cup 153  Baked beans, 1 cup 142  Cottage cheese, 1% milk fat, 1 cup 138  Spaghetti, lasagna, 1 cup 125  Frozen yogurt, vanilla, soft-serve,  cup 103  Ready-to-eat cereal, fortified with calcium, 1 cup 100-1,000 (varies)  Cheese pizza, 1 slice 100  Fortified waffles, 2 100  Turnip greens, boiled,  cup 99  Broccoli, raw, 1 cup 90  Ice cream, vanilla,  cup 85  Soy or rice milk, fortified with calcium, 1 cup 80-500 (varies)   Vitamin D content (International Units, IU) - https://www.ars.usda.gov Cod liver oil, 1 tablespoon 1,360  Swordfish, cooked, 3 oz 566  Salmon (sockeye), cooked, 3 oz 447  Tuna fish, canned in water, drained, 3 oz  154  Orange juice fortified with vitamin D, 1 cup (check product labels, as amount of added vitamin D varies) 137  Milk, nonfat, reduced fat, and whole, vitamin D-fortified, 1 cup 115-124  Yogurt, fortified with 20% of the daily value for vitamin D, 6 oz 80  Margarine, fortified, 1 tablespoon 60  Sardines, canned in oil, drained, 2 sardines 46  Liver, beef, cooked, 3 oz 42  Egg, 1 large (vitamin D is found in yolk) 41  Ready-to-eat cereal, fortified with 10% of the daily value for vitamin D, 0.75-1 cup  40  Cheese, Swiss, 1 oz 6

## 2012-10-22 LAB — THYROGLOBULIN LEVEL: Thyroglobulin: <0.2 ng/mL (ref 0.0–55.0)

## 2012-10-22 LAB — VITAMIN D 25 HYDROXY (VIT D DEFICIENCY, FRACTURES): Vit D, 25-Hydroxy: 93 ng/mL — ABNORMAL HIGH (ref 30–89)

## 2012-10-23 ENCOUNTER — Telehealth: Payer: Self-pay | Admitting: *Deleted

## 2012-10-23 NOTE — Telephone Encounter (Signed)
Patient daughter notified of incorrect name on HCPOA  Form for [facility] to release records to Dr. Elvera Lennox.  Daughter will call [facility] and talk with Heloise Ochoa there to try to get this corrected.

## 2012-10-24 ENCOUNTER — Other Ambulatory Visit: Payer: Self-pay | Admitting: Internal Medicine

## 2012-10-24 DIAGNOSIS — Z85850 Personal history of malignant neoplasm of thyroid: Secondary | ICD-10-CM | POA: Insufficient documentation

## 2012-10-24 DIAGNOSIS — E890 Postprocedural hypothyroidism: Secondary | ICD-10-CM

## 2012-10-24 DIAGNOSIS — E892 Postprocedural hypoparathyroidism: Secondary | ICD-10-CM

## 2012-10-24 MED ORDER — LEVOTHYROXINE SODIUM 125 MCG PO TABS: 125.0000 ug | ORAL_TABLET | Freq: Every day | ORAL | Status: DC

## 2012-10-24 MED ORDER — SYNTHROID 125 MCG PO TABS: 125.0000 ug | ORAL_TABLET | Freq: Every day | ORAL | Status: DC

## 2012-10-24 MED ORDER — VITAMIN D (ERGOCALCIFEROL) 1.25 MG (50000 UNIT) PO CAPS: 50000.0000 [IU] | ORAL_CAPSULE | ORAL | Status: DC

## 2012-10-28 ENCOUNTER — Encounter: Payer: Self-pay | Admitting: Internal Medicine

## 2012-10-29 ENCOUNTER — Other Ambulatory Visit: Payer: Medicare Other

## 2012-10-31 ENCOUNTER — Telehealth: Payer: Self-pay | Admitting: *Deleted

## 2012-10-31 NOTE — Telephone Encounter (Signed)
Faxed consent form to Engineer, building services at [facility]. Medical Records for release of information for medical  records for Dr. Elvera Lennox.

## 2012-11-06 ENCOUNTER — Ambulatory Visit (HOSPITAL_COMMUNITY): Payer: Medicare Other

## 2012-12-12 DIAGNOSIS — M1710 Unilateral primary osteoarthritis, unspecified knee: Secondary | ICD-10-CM | POA: Diagnosis not present

## 2012-12-15 DIAGNOSIS — R112 Nausea with vomiting, unspecified: Secondary | ICD-10-CM | POA: Diagnosis not present

## 2012-12-15 DIAGNOSIS — F0390 Unspecified dementia without behavioral disturbance: Secondary | ICD-10-CM | POA: Diagnosis not present

## 2012-12-15 DIAGNOSIS — R6889 Other general symptoms and signs: Secondary | ICD-10-CM | POA: Diagnosis not present

## 2012-12-15 DIAGNOSIS — G43909 Migraine, unspecified, not intractable, without status migrainosus: Secondary | ICD-10-CM | POA: Diagnosis not present

## 2012-12-15 DIAGNOSIS — R197 Diarrhea, unspecified: Secondary | ICD-10-CM | POA: Diagnosis not present

## 2012-12-15 DIAGNOSIS — M25569 Pain in unspecified knee: Secondary | ICD-10-CM | POA: Diagnosis not present

## 2012-12-15 DIAGNOSIS — M545 Low back pain, unspecified: Secondary | ICD-10-CM | POA: Diagnosis not present

## 2012-12-15 DIAGNOSIS — Z79899 Other long term (current) drug therapy: Secondary | ICD-10-CM | POA: Diagnosis not present

## 2012-12-16 DIAGNOSIS — M25579 Pain in unspecified ankle and joints of unspecified foot: Secondary | ICD-10-CM | POA: Diagnosis not present

## 2012-12-16 DIAGNOSIS — S99929A Unspecified injury of unspecified foot, initial encounter: Secondary | ICD-10-CM | POA: Diagnosis not present

## 2012-12-16 DIAGNOSIS — R1110 Vomiting, unspecified: Secondary | ICD-10-CM | POA: Diagnosis not present

## 2012-12-16 DIAGNOSIS — R112 Nausea with vomiting, unspecified: Secondary | ICD-10-CM | POA: Diagnosis not present

## 2012-12-16 DIAGNOSIS — S99919A Unspecified injury of unspecified ankle, initial encounter: Secondary | ICD-10-CM | POA: Diagnosis not present

## 2012-12-16 DIAGNOSIS — R197 Diarrhea, unspecified: Secondary | ICD-10-CM | POA: Diagnosis not present

## 2012-12-17 ENCOUNTER — Ambulatory Visit: Payer: Medicare Other | Admitting: Cardiology

## 2012-12-19 ENCOUNTER — Encounter: Payer: Medicare Other | Admitting: Internal Medicine

## 2012-12-30 DIAGNOSIS — F0150 Vascular dementia without behavioral disturbance: Secondary | ICD-10-CM | POA: Diagnosis not present

## 2012-12-30 DIAGNOSIS — E559 Vitamin D deficiency, unspecified: Secondary | ICD-10-CM | POA: Diagnosis not present

## 2012-12-30 DIAGNOSIS — E876 Hypokalemia: Secondary | ICD-10-CM | POA: Diagnosis not present

## 2012-12-30 DIAGNOSIS — K259 Gastric ulcer, unspecified as acute or chronic, without hemorrhage or perforation: Secondary | ICD-10-CM | POA: Diagnosis not present

## 2012-12-30 DIAGNOSIS — K920 Hematemesis: Secondary | ICD-10-CM | POA: Diagnosis not present

## 2012-12-30 DIAGNOSIS — R7309 Other abnormal glucose: Secondary | ICD-10-CM | POA: Diagnosis not present

## 2012-12-30 DIAGNOSIS — E209 Hypoparathyroidism, unspecified: Secondary | ICD-10-CM | POA: Diagnosis not present

## 2012-12-30 DIAGNOSIS — C73 Malignant neoplasm of thyroid gland: Secondary | ICD-10-CM | POA: Diagnosis not present

## 2012-12-30 DIAGNOSIS — R000 Tachycardia, unspecified: Secondary | ICD-10-CM | POA: Diagnosis not present

## 2012-12-30 DIAGNOSIS — K219 Gastro-esophageal reflux disease without esophagitis: Secondary | ICD-10-CM | POA: Diagnosis not present

## 2012-12-30 DIAGNOSIS — R32 Unspecified urinary incontinence: Secondary | ICD-10-CM | POA: Diagnosis not present

## 2012-12-30 DIAGNOSIS — E039 Hypothyroidism, unspecified: Secondary | ICD-10-CM | POA: Diagnosis not present

## 2013-01-18 DIAGNOSIS — R05 Cough: Secondary | ICD-10-CM | POA: Diagnosis not present

## 2013-01-18 DIAGNOSIS — J40 Bronchitis, not specified as acute or chronic: Secondary | ICD-10-CM | POA: Diagnosis not present

## 2013-01-31 ENCOUNTER — Ambulatory Visit: Payer: Medicare Other | Admitting: Cardiology

## 2013-02-12 DIAGNOSIS — B372 Candidiasis of skin and nail: Secondary | ICD-10-CM | POA: Diagnosis not present

## 2013-02-12 DIAGNOSIS — K920 Hematemesis: Secondary | ICD-10-CM | POA: Diagnosis not present

## 2013-02-12 DIAGNOSIS — R000 Tachycardia, unspecified: Secondary | ICD-10-CM | POA: Diagnosis not present

## 2013-02-12 DIAGNOSIS — K259 Gastric ulcer, unspecified as acute or chronic, without hemorrhage or perforation: Secondary | ICD-10-CM | POA: Diagnosis not present

## 2013-02-12 DIAGNOSIS — F0150 Vascular dementia without behavioral disturbance: Secondary | ICD-10-CM | POA: Diagnosis not present

## 2013-02-12 DIAGNOSIS — G40909 Epilepsy, unspecified, not intractable, without status epilepticus: Secondary | ICD-10-CM | POA: Diagnosis not present

## 2013-02-12 DIAGNOSIS — E209 Hypoparathyroidism, unspecified: Secondary | ICD-10-CM | POA: Diagnosis not present

## 2013-02-12 DIAGNOSIS — Z8674 Personal history of sudden cardiac arrest: Secondary | ICD-10-CM | POA: Diagnosis not present

## 2013-03-07 DIAGNOSIS — R112 Nausea with vomiting, unspecified: Secondary | ICD-10-CM | POA: Diagnosis not present

## 2013-03-07 DIAGNOSIS — R195 Other fecal abnormalities: Secondary | ICD-10-CM | POA: Diagnosis not present

## 2013-03-08 NOTE — Progress Notes (Signed)
This encounter was created in error - please disregard.

## 2013-03-11 DIAGNOSIS — Z853 Personal history of malignant neoplasm of breast: Secondary | ICD-10-CM | POA: Diagnosis not present

## 2013-03-11 DIAGNOSIS — K228 Other specified diseases of esophagus: Secondary | ICD-10-CM | POA: Diagnosis not present

## 2013-03-11 DIAGNOSIS — I672 Cerebral atherosclerosis: Secondary | ICD-10-CM | POA: Diagnosis not present

## 2013-03-11 DIAGNOSIS — G40909 Epilepsy, unspecified, not intractable, without status epilepticus: Secondary | ICD-10-CM | POA: Diagnosis not present

## 2013-03-11 DIAGNOSIS — Z85850 Personal history of malignant neoplasm of thyroid: Secondary | ICD-10-CM | POA: Diagnosis not present

## 2013-03-11 DIAGNOSIS — K2210 Ulcer of esophagus without bleeding: Secondary | ICD-10-CM | POA: Diagnosis not present

## 2013-03-11 DIAGNOSIS — Z883 Allergy status to other anti-infective agents status: Secondary | ICD-10-CM | POA: Diagnosis not present

## 2013-03-11 DIAGNOSIS — R112 Nausea with vomiting, unspecified: Secondary | ICD-10-CM | POA: Diagnosis not present

## 2013-03-11 DIAGNOSIS — Z79899 Other long term (current) drug therapy: Secondary | ICD-10-CM | POA: Diagnosis not present

## 2013-03-11 DIAGNOSIS — Z803 Family history of malignant neoplasm of breast: Secondary | ICD-10-CM | POA: Diagnosis not present

## 2013-03-11 DIAGNOSIS — Z882 Allergy status to sulfonamides status: Secondary | ICD-10-CM | POA: Diagnosis not present

## 2013-03-11 DIAGNOSIS — R51 Headache: Secondary | ICD-10-CM | POA: Diagnosis not present

## 2013-03-11 DIAGNOSIS — Z7982 Long term (current) use of aspirin: Secondary | ICD-10-CM | POA: Diagnosis not present

## 2013-03-11 DIAGNOSIS — K219 Gastro-esophageal reflux disease without esophagitis: Secondary | ICD-10-CM | POA: Diagnosis not present

## 2013-03-11 DIAGNOSIS — I456 Pre-excitation syndrome: Secondary | ICD-10-CM | POA: Diagnosis not present

## 2013-03-11 DIAGNOSIS — K449 Diaphragmatic hernia without obstruction or gangrene: Secondary | ICD-10-CM | POA: Diagnosis not present

## 2013-03-11 DIAGNOSIS — G4700 Insomnia, unspecified: Secondary | ICD-10-CM | POA: Diagnosis not present

## 2013-03-11 DIAGNOSIS — F0150 Vascular dementia without behavioral disturbance: Secondary | ICD-10-CM | POA: Diagnosis not present

## 2013-03-11 DIAGNOSIS — F411 Generalized anxiety disorder: Secondary | ICD-10-CM | POA: Diagnosis not present

## 2013-03-11 DIAGNOSIS — R195 Other fecal abnormalities: Secondary | ICD-10-CM | POA: Diagnosis not present

## 2013-03-11 DIAGNOSIS — K2970 Gastritis, unspecified, without bleeding: Secondary | ICD-10-CM | POA: Diagnosis not present

## 2013-03-11 DIAGNOSIS — Z9109 Other allergy status, other than to drugs and biological substances: Secondary | ICD-10-CM | POA: Diagnosis not present

## 2013-03-11 DIAGNOSIS — K2990 Gastroduodenitis, unspecified, without bleeding: Secondary | ICD-10-CM | POA: Diagnosis not present

## 2013-03-11 DIAGNOSIS — K2289 Other specified disease of esophagus: Secondary | ICD-10-CM | POA: Diagnosis not present

## 2013-03-11 DIAGNOSIS — Z808 Family history of malignant neoplasm of other organs or systems: Secondary | ICD-10-CM | POA: Diagnosis not present

## 2013-03-11 DIAGNOSIS — K2960 Other gastritis without bleeding: Secondary | ICD-10-CM | POA: Diagnosis not present

## 2013-03-11 DIAGNOSIS — E039 Hypothyroidism, unspecified: Secondary | ICD-10-CM | POA: Diagnosis not present

## 2013-03-11 DIAGNOSIS — K209 Esophagitis, unspecified without bleeding: Secondary | ICD-10-CM | POA: Diagnosis not present

## 2013-03-12 ENCOUNTER — Encounter: Payer: Self-pay | Admitting: Physician Assistant

## 2013-03-12 ENCOUNTER — Ambulatory Visit (INDEPENDENT_AMBULATORY_CARE_PROVIDER_SITE_OTHER): Payer: Medicare Other | Admitting: Physician Assistant

## 2013-03-12 VITALS — BP 88/59 | HR 96 | Ht 61.0 in | Wt 190.8 lb

## 2013-03-12 DIAGNOSIS — R079 Chest pain, unspecified: Secondary | ICD-10-CM | POA: Diagnosis not present

## 2013-03-12 DIAGNOSIS — E785 Hyperlipidemia, unspecified: Secondary | ICD-10-CM | POA: Diagnosis not present

## 2013-03-12 DIAGNOSIS — I340 Nonrheumatic mitral (valve) insufficiency: Secondary | ICD-10-CM | POA: Insufficient documentation

## 2013-03-12 DIAGNOSIS — I059 Rheumatic mitral valve disease, unspecified: Secondary | ICD-10-CM

## 2013-03-12 MED ORDER — NITROGLYCERIN 0.4 MG SL SUBL: 0.4000 mg | SUBLINGUAL_TABLET | SUBLINGUAL | Status: DC | PRN

## 2013-03-12 MED ORDER — ISOSORBIDE MONONITRATE ER 30 MG PO TB24: 30.0000 mg | ORAL_TABLET | Freq: Every day | ORAL | Status: DC

## 2013-03-12 NOTE — Assessment & Plan Note (Signed)
We discussed the results of the patient's recent echocardiogram and stress tests. In light of her recent diagnosis of gastric ulcers, I recommended conservative management and continued close monitoring over the next several weeks. She is to hold ASA, and any NSAIDs, as advised, until she is cleared to resume low-dose ASA, by Dr. Teena Dunk. Meanwhile, we will start her on Imdur 30 mg daily and have her return to see me in 2 weeks. I'm unable to add beta blocker, secondary to relative hypotension. Although some of her symptomatology is worrisome for ischemic heart disease, her recent stress test did not suggest high risk anatomy. Therefore, aggressive medical management is currently indicated, with close followup. Also, patient apparently needs to undergo a TKR in the near future. I advised her daughter to hold off on this for as long as possible, pending complete evaluation of her chest pain, including possible cardiac catheterization in the near future, if her symptoms are progressive and unrelieved by medical therapy, and once her gastric ulcers have healed.

## 2013-03-12 NOTE — Assessment & Plan Note (Signed)
Followed by primary M.D. Patient currently not on a lipid-lowering agent.

## 2013-03-12 NOTE — Assessment & Plan Note (Signed)
Assessed as moderate, by echocardiogram, 09/2012

## 2013-03-12 NOTE — Progress Notes (Signed)
Primary Cardiologist: Marca Ancona, MD   HPI: Patient returns in followup, last seen here in clinic in December 2013, by Dr. Shirlee Latch, for cardiac evaluation. At that time, he ordered an echocardiogram and a Lexiscan Myoview, for further evaluation of CP and DOE. She also reported history of 4 "cardiac arrests", but which were actually seizures, according to the patient's daughter.   - Echocardiogram, 09/2012: Normal EF, moderate eccentric MR, mild pulmonary hypertension   - Lexiscan Cardiolite, 09/2012: Low risk stress nuclear study with a small, medium intensity, partially reversible anterior/apical defect consistent with soft tissue attenuation and mild ischemia; LV Ejection Fraction: 68%. LV Wall Motion: NL LV Function; NL Wall Motion   Her daughter presents with her, and reports that the patient actually underwent upper endoscopy yesterday, by Dr. Leota Jacobsen, revealing multiple ulcers. It is not clear if there was any evidence of active bleeding; however, the daughter states that the patient has been experiencing frequent vomiting, with coffee-ground emesis. Patient was instructed to stop ASA, which we had added at time of last OV, following yesterday's findings.  Regarding her chest pain, she suggests that she has been having this ever since her oldest daughter was 65 years old, some 48 years ago. She essentially has chest pain several times a week, sometimes unpredictable in onset, other times precipitated by walking. She characterizes this as at times sharp, but sometimes dull with radiation to the left side of the neck, and down the full length of her left arm. She also reports significant exertional dyspnea. She takes NTG tablets by mouth, including as many as 3 on several occasions, but has not had to take any for approximately 2 months.  She also reportedly has undergone heart catheterizations on 3 previous occasions, the last approximately 3 years ago in [place]. She does not recall the  results of her last study, and the daughter is unaware of any diagnosis of significant CAD.  Allergies  Allergen Reactions  . Adhesive (Tape)   . Asa (Aspirin) Other (See Comments)    ulcers  . Cyclobenzaprine   . Imdur (Isosorbide)     Headache, and rash   . Iodine   . Pentazocine   . Propoxyphene And Methadone   . Septra (Sulfamethoxazole W-Trimethoprim)   . Sulfa Antibiotics   . Tegretol (Carbamazepine)   . Tetracyclines & Related     Current Outpatient Prescriptions  Medication Sig Dispense Refill  . acetaminophen (TYLENOL) 500 MG tablet Take 500 mg by mouth 3 (three) times daily as needed.      . butalbital-aspirin-caffeine (FIORINAL) 50-325-40 MG per tablet Take 1 tablet by mouth at bedtime.      . clonazePAM (KLONOPIN) 0.5 MG tablet Take 0.5 mg by mouth 3 (three) times daily.      . diphenoxylate-atropine (LOMOTIL) 2.5-0.025 MG per tablet Take 1 tablet by mouth as needed for diarrhea or loose stools.      . DULoxetine (CYMBALTA) 60 MG capsule Take 60 mg by mouth 2 (two) times daily.      Marland Kitchen esomeprazole (NEXIUM) 40 MG capsule Take 40 mg by mouth daily.      Marland Kitchen levothyroxine (SYNTHROID, LEVOTHROID) 125 MCG tablet Take 125 mcg by mouth daily.      Marland Kitchen MAGNESIUM CHLORIDE ER PO Take 1 tablet by mouth daily.       . nitroGLYCERIN (NITROSTAT) 0.4 MG SL tablet Place 1 tablet (0.4 mg total) under the tongue every 5 (five) minutes as needed.  25 tablet  3  .  PHENObarbital (LUMINAL) 30 MG tablet Take 30 mg by mouth 3 (three) times daily.      . potassium chloride (K-DUR) 10 MEQ tablet Take 10 mEq by mouth daily.      . promethazine (PHENERGAN) 25 MG tablet Take 25 mg by mouth every 6 (six) hours as needed.      . risperiDONE (RISPERDAL) 3 MG tablet Take 3 mg by mouth 2 (two) times daily.      Marland Kitchen rOPINIRole (REQUIP) 2 MG tablet Take 2 mg by mouth at bedtime.      . solifenacin (VESICARE) 10 MG tablet Take 10 mg by mouth daily.      . SUMAtriptan (IMITREX) 100 MG tablet Take 100 mg by  mouth every 2 (two) hours as needed.      . Vitamin D, Ergocalciferol, (DRISDOL) 50000 UNITS CAPS Take 1 capsule (50,000 Units total) by mouth every 7 (seven) days. Start on 10/31/2012.  10 capsule  1  . zolpidem (AMBIEN CR) 6.25 MG CR tablet Take 6.25 mg by mouth at bedtime.       . isosorbide mononitrate (IMDUR) 30 MG 24 hr tablet Take 1 tablet (30 mg total) by mouth daily.  30 tablet  6   No current facility-administered medications for this visit.    Past Medical History  Diagnosis Date  . Vascular dementia, uncomplicated   . Unspecified epilepsy without mention of intractable epilepsy   . Tachycardia, unspecified   . Hypoparathyroidism   . Malignant neoplasm of thyroid gland   . Personal history of malignant neoplasm of breast   . Unspecified hypothyroidism   . Esophageal reflux   . Unspecified urinary incontinence   . Insomnia, unspecified   . Migraine   . Anxiety state, unspecified   . Hypopotassemia   . Wolff-Parkinson-White (WPW) syndrome   . Mitral regurgitation     Moderate, echo, 01/2012  . HLD (hyperlipidemia)     No past surgical history on file.  History   Social History  . Marital Status: Widowed    Spouse Name: N/A    Number of Children: N/A  . Years of Education: N/A   Occupational History  . Not on file.   Social History Main Topics  . Smoking status: Never Smoker   . Smokeless tobacco: Not on file  . Alcohol Use: No  . Drug Use: No  . Sexually Active: Not on file   Other Topics Concern  . Not on file   Social History Narrative  . No narrative on file    Family History  Problem Relation Age of Onset  . Cancer Mother   . Hyperlipidemia Father   . Cancer Father     ROS: no nausea, vomiting; no fever, chills; no melena, hematochezia; no claudication  PHYSICAL EXAM: BP 88/59  Pulse 96  Ht 5\' 1"  (1.549 m)  Wt 190 lb 12.8 oz (86.546 kg)  BMI 36.07 kg/m2  SpO2 94% GENERAL: 67 year old female, morbidly obese; NAD HEENT: NCAT, PERRLA,  EOMI; sclera clear; no xanthelasma NECK: palpable bilateral carotid pulses, no bruits; no JVD; right lower neck scar LUNGS: CTA bilaterally CARDIAC: RRR (S1, S2); no significant murmurs; no rubs or gallops ABDOMEN: soft, non-tender; intact BS EXTREMETIES: trace peripheral edema SKIN: warm/dry; no obvious rash/lesions MUSCULOSKELETAL: no joint deformity NEURO: no focal deficit; NL affect   EKG:    ASSESSMENT & PLAN:  Chest pain We discussed the results of the patient's recent echocardiogram and stress tests. In light of her recent diagnosis  of gastric ulcers, I recommended conservative management and continued close monitoring over the next several weeks. She is to hold ASA, and any NSAIDs, as advised, until she is cleared to resume low-dose ASA, by Dr. Teena Dunk. Meanwhile, we will start her on Imdur 30 mg daily and have her return to see me in 2 weeks. I'm unable to add beta blocker, secondary to relative hypotension. Although some of her symptomatology is worrisome for ischemic heart disease, her recent stress test did not suggest high risk anatomy. Moreover, there were no focal WMAs, by echocardiography. Therefore, aggressive medical management is currently indicated, with close followup. Also, patient apparently needs to undergo a TKR in the near future. I advised her daughter to hold off on this for as long as possible, pending complete evaluation of her chest pain, including possible cardiac catheterization in the near future, if her symptoms are progressive and unrelieved by medical therapy, and once her gastric ulcers have healed.  Mitral regurgitation Assessed as moderate, by echocardiogram, 09/2012  HLD (hyperlipidemia) Followed by primary M.D. Patient currently not on a lipid-lowering agent.    Gene Amijah Timothy, PAC

## 2013-03-12 NOTE — Patient Instructions (Signed)
   Continue to remain off the Aspirin  Nitroglycerin - as needed for severe chest pain only  Begin Imdur 30mg  daily Continue all other current medications. Follow up in  2 weeks

## 2013-03-27 ENCOUNTER — Encounter: Payer: Self-pay | Admitting: Cardiology

## 2013-03-27 ENCOUNTER — Ambulatory Visit (INDEPENDENT_AMBULATORY_CARE_PROVIDER_SITE_OTHER): Payer: Medicare Other | Admitting: Cardiology

## 2013-03-27 VITALS — BP 132/73 | HR 96 | Ht 61.0 in | Wt 186.0 lb

## 2013-03-27 DIAGNOSIS — I340 Nonrheumatic mitral (valve) insufficiency: Secondary | ICD-10-CM

## 2013-03-27 DIAGNOSIS — R079 Chest pain, unspecified: Secondary | ICD-10-CM | POA: Diagnosis not present

## 2013-03-27 DIAGNOSIS — I059 Rheumatic mitral valve disease, unspecified: Secondary | ICD-10-CM | POA: Diagnosis not present

## 2013-03-27 NOTE — Patient Instructions (Addendum)
Your physician recommends that you schedule a follow-up appointment in: 6 months. You will receive a reminder letter in the mail in about 4 months reminding you to call and schedule your appointment. If you don't receive this letter, please contact our office. Your physician recommends that you continue on your current medications as directed. Please refer to the Current Medication list given to you today. Please take your aspirin 81 mg daily. PLEASE DO NOT TAKE ANY NON-STERODIAL ANTI-INFLAMMATORY DRUG.

## 2013-03-30 NOTE — Progress Notes (Signed)
Patient ID: Samantha Odonnell, female   DOB: 1946-05-30, 67 y.o.   MRN: [id number] PCP: Dr. Leandrew Koyanagi  67 yo with history of depression and chest pain presents for cardiology followup.  Patient has a questionable cardiac history.  She used to live near [place], [place] and saw a cardiologist there.  She says that she has had 4 "cardiac arrests."  These actually sound like seizures and her daughter says they were seizures.  She has moved to [place] to be near her daughter and is living at [facility]. She gets chest pressure periodically, 1-2 times a week.  It is central and will last for a few minutes until she takes NTG.  Chest pain resolves with NTG.  There is no trigger for the chest pain, it is not exertional.  She is short of breath walking up steps or walking a "long way" on flat ground.  She thinks she has had a cath in the past, sounds like several years ago.  She thinks it was normal.   I had her do an ETT-Myoview in 12/13.  This was a low risk study with a small, partially reversible anterior defect that may have been due to attenuation.  Echo in 12/13 showed EF 60-65% with moderate eccentric MR.  Since I last saw her, she was diagnosed with gastric ulcers, which may be a cause for her chest pain.  She additionally has had chronic nausea.  She was taking a lot of NSAIDs but has now stopped this.  She continues to be a very difficult historian.   Labs (8/13): K 4.4, creatinine 0.74, HCT 29.6 Labs (3/14): K 4.1, creatinine 0.65  PMH: 1. Depression 2. Panic disorder 3. GERD 4. Thyroid cancer s/p thyroidectomy, now hypothyroid.  5. H/o pancreatitis 6. Seizure disorder 7. TAH 8. Laminectomy 9. Anemia 10. Osteoarthritis, especially affecting knees.  11. Parathyroidectomy 12. PUD: EGD with gastric ulcers, thought to be related to heavy NSAIDs.  13. Memory difficulty 14. Chest pain: ETT-Myoview in 12/13 with low risk study showing a small, partially reversible anterior defect that may have been  attenuation.  Echo in 12/13 showed EF 60-65%, moderate eccentric MR, PA systolic pressure 41 mmhg.   SH: Lives in [place] at [place].  Daughter lives in [place] as well.  Nonsmoker.    FH: Father with CABG at 61  ROS: All systems reviewed and negative except as per HPI.   Current Outpatient Prescriptions  Medication Sig Dispense Refill  . acetaminophen (TYLENOL) 500 MG tablet Take 500 mg by mouth 3 (three) times daily as needed.      Marland Kitchen aspirin 81 MG tablet Take 81 mg by mouth daily.      . butalbital-aspirin-caffeine (FIORINAL) 50-325-40 MG per tablet Take 1 tablet by mouth at bedtime.      . cetirizine (ZYRTEC) 10 MG tablet Take 10 mg by mouth daily.      . clonazePAM (KLONOPIN) 0.5 MG tablet Take 0.5 mg by mouth 3 (three) times daily.      . diphenoxylate-atropine (LOMOTIL) 2.5-0.025 MG per tablet Take 1 tablet by mouth as needed for diarrhea or loose stools.      . DULoxetine (CYMBALTA) 60 MG capsule Take 60 mg by mouth 2 (two) times daily.      Marland Kitchen esomeprazole (NEXIUM) 40 MG capsule Take 40 mg by mouth daily.      . isosorbide mononitrate (IMDUR) 30 MG 24 hr tablet Take 1 tablet (30 mg total) by mouth daily.  30  tablet  6  . levothyroxine (SYNTHROID, LEVOTHROID) 125 MCG tablet Take 125 mcg by mouth daily.      Marland Kitchen MAGNESIUM CHLORIDE ER PO Take 1 tablet by mouth daily.       . nitroGLYCERIN (NITROSTAT) 0.4 MG SL tablet Place 1 tablet (0.4 mg total) under the tongue every 5 (five) minutes as needed.  25 tablet  3  . PHENObarbital (LUMINAL) 30 MG tablet Take 30 mg by mouth 3 (three) times daily.      . potassium chloride (K-DUR) 10 MEQ tablet Take 10 mEq by mouth daily.      . promethazine (PHENERGAN) 25 MG tablet Take 25 mg by mouth every 6 (six) hours as needed.      . risperiDONE (RISPERDAL) 3 MG tablet Take 3 mg by mouth 2 (two) times daily.      Marland Kitchen rOPINIRole (REQUIP) 2 MG tablet Take 2 mg by mouth at bedtime.      . solifenacin (VESICARE) 10 MG tablet Take 10 mg by mouth daily.      .  SUMAtriptan (IMITREX) 100 MG tablet Take 100 mg by mouth every 2 (two) hours as needed.      . Vitamin D, Ergocalciferol, (DRISDOL) 50000 UNITS CAPS Take 1 capsule (50,000 Units total) by mouth every 7 (seven) days. Start on 10/31/2012.  10 capsule  1  . zolpidem (AMBIEN CR) 6.25 MG CR tablet Take 6.25 mg by mouth at bedtime.        No current facility-administered medications for this visit.    BP 132/73  Pulse 96  Ht 5\' 1"  (1.549 m)  Wt 186 lb (84.369 kg)  BMI 35.16 kg/m2  SpO2 97% General: NAD Neck: No JVD, no thyromegaly or thyroid nodule.  Lungs: Clear to auscultation bilaterally with normal respiratory effort. CV: Nondisplaced PMI.  Heart regular S1/S2, no S3/S4, no murmur.  No peripheral edema.  No carotid bruit.  Normal pedal pulses.  Abdomen: Soft, nontender, no hepatosplenomegaly, no distention.  Neurologic: Alert and oriented x 3.  Psych: Normal affect. Extremities: No clubbing or cyanosis.   Assessment/Plan: 1. Chest pain: Atypical but seems to be relieved at times by NTG.  Low risk Myoview and Echo with preserved EF.  She insists she has had 4 cardiac arrests but her daughter says they were seizures.  - - No further workup at this time.  - She can resume ASA 81 mg daily (stopped after bleed from PUD) but no NSAIDs.  2. Mitral regurgitation: Moderate on echo but I do not hear much of a murmur.  Follow over time.    Marca Ancona 03/30/2013

## 2013-04-22 DIAGNOSIS — J40 Bronchitis, not specified as acute or chronic: Secondary | ICD-10-CM | POA: Diagnosis not present

## 2013-04-22 DIAGNOSIS — R21 Rash and other nonspecific skin eruption: Secondary | ICD-10-CM | POA: Diagnosis not present

## 2013-05-27 ENCOUNTER — Other Ambulatory Visit: Payer: Self-pay | Admitting: *Deleted

## 2013-05-27 ENCOUNTER — Other Ambulatory Visit: Payer: Self-pay | Admitting: Internal Medicine

## 2013-05-30 ENCOUNTER — Other Ambulatory Visit: Payer: Self-pay | Admitting: Internal Medicine

## 2013-05-30 ENCOUNTER — Other Ambulatory Visit: Payer: Self-pay | Admitting: *Deleted

## 2013-05-30 NOTE — Telephone Encounter (Signed)
Opened encounter in error  

## 2013-06-30 DIAGNOSIS — D485 Neoplasm of uncertain behavior of skin: Secondary | ICD-10-CM | POA: Diagnosis not present

## 2013-06-30 DIAGNOSIS — L259 Unspecified contact dermatitis, unspecified cause: Secondary | ICD-10-CM | POA: Diagnosis not present

## 2013-06-30 DIAGNOSIS — L821 Other seborrheic keratosis: Secondary | ICD-10-CM | POA: Diagnosis not present

## 2013-08-06 ENCOUNTER — Encounter: Payer: Self-pay | Admitting: Cardiology

## 2013-08-06 DIAGNOSIS — I491 Atrial premature depolarization: Secondary | ICD-10-CM | POA: Diagnosis not present

## 2013-08-06 DIAGNOSIS — E890 Postprocedural hypothyroidism: Secondary | ICD-10-CM | POA: Diagnosis not present

## 2013-08-06 DIAGNOSIS — R5381 Other malaise: Secondary | ICD-10-CM | POA: Diagnosis not present

## 2013-08-06 DIAGNOSIS — R000 Tachycardia, unspecified: Secondary | ICD-10-CM | POA: Diagnosis not present

## 2013-08-06 DIAGNOSIS — R9431 Abnormal electrocardiogram [ECG] [EKG]: Secondary | ICD-10-CM | POA: Diagnosis not present

## 2013-08-06 DIAGNOSIS — Z96659 Presence of unspecified artificial knee joint: Secondary | ICD-10-CM | POA: Diagnosis not present

## 2013-08-06 DIAGNOSIS — Z85850 Personal history of malignant neoplasm of thyroid: Secondary | ICD-10-CM | POA: Diagnosis not present

## 2013-08-06 DIAGNOSIS — Z2821 Immunization not carried out because of patient refusal: Secondary | ICD-10-CM | POA: Diagnosis not present

## 2013-08-06 DIAGNOSIS — Z79899 Other long term (current) drug therapy: Secondary | ICD-10-CM | POA: Diagnosis not present

## 2013-08-06 DIAGNOSIS — Z883 Allergy status to other anti-infective agents status: Secondary | ICD-10-CM | POA: Diagnosis not present

## 2013-08-06 DIAGNOSIS — E878 Other disorders of electrolyte and fluid balance, not elsewhere classified: Secondary | ICD-10-CM | POA: Diagnosis not present

## 2013-08-06 DIAGNOSIS — F0390 Unspecified dementia without behavioral disturbance: Secondary | ICD-10-CM | POA: Diagnosis present

## 2013-08-06 DIAGNOSIS — K922 Gastrointestinal hemorrhage, unspecified: Secondary | ICD-10-CM | POA: Diagnosis not present

## 2013-08-06 DIAGNOSIS — E209 Hypoparathyroidism, unspecified: Secondary | ICD-10-CM | POA: Diagnosis not present

## 2013-08-06 DIAGNOSIS — Z7982 Long term (current) use of aspirin: Secondary | ICD-10-CM | POA: Diagnosis not present

## 2013-08-06 DIAGNOSIS — G43909 Migraine, unspecified, not intractable, without status migrainosus: Secondary | ICD-10-CM | POA: Diagnosis present

## 2013-08-06 DIAGNOSIS — R6889 Other general symptoms and signs: Secondary | ICD-10-CM | POA: Diagnosis not present

## 2013-08-06 DIAGNOSIS — Z853 Personal history of malignant neoplasm of breast: Secondary | ICD-10-CM | POA: Diagnosis not present

## 2013-08-06 DIAGNOSIS — Z882 Allergy status to sulfonamides status: Secondary | ICD-10-CM | POA: Diagnosis not present

## 2013-08-06 DIAGNOSIS — M47817 Spondylosis without myelopathy or radiculopathy, lumbosacral region: Secondary | ICD-10-CM | POA: Diagnosis not present

## 2013-08-06 DIAGNOSIS — Z888 Allergy status to other drugs, medicaments and biological substances status: Secondary | ICD-10-CM | POA: Diagnosis not present

## 2013-08-06 DIAGNOSIS — I4581 Long QT syndrome: Secondary | ICD-10-CM | POA: Diagnosis not present

## 2013-08-06 DIAGNOSIS — E860 Dehydration: Secondary | ICD-10-CM | POA: Diagnosis present

## 2013-08-06 DIAGNOSIS — D649 Anemia, unspecified: Secondary | ICD-10-CM | POA: Diagnosis not present

## 2013-08-06 DIAGNOSIS — I959 Hypotension, unspecified: Secondary | ICD-10-CM | POA: Diagnosis not present

## 2013-08-06 DIAGNOSIS — C73 Malignant neoplasm of thyroid gland: Secondary | ICD-10-CM | POA: Diagnosis not present

## 2013-08-06 DIAGNOSIS — I2510 Atherosclerotic heart disease of native coronary artery without angina pectoris: Secondary | ICD-10-CM | POA: Diagnosis present

## 2013-08-06 DIAGNOSIS — K2900 Acute gastritis without bleeding: Secondary | ICD-10-CM | POA: Diagnosis not present

## 2013-08-06 DIAGNOSIS — D62 Acute posthemorrhagic anemia: Secondary | ICD-10-CM | POA: Diagnosis not present

## 2013-08-06 DIAGNOSIS — M5137 Other intervertebral disc degeneration, lumbosacral region: Secondary | ICD-10-CM | POA: Diagnosis not present

## 2013-08-06 DIAGNOSIS — M48061 Spinal stenosis, lumbar region without neurogenic claudication: Secondary | ICD-10-CM | POA: Diagnosis not present

## 2013-08-06 DIAGNOSIS — Z8711 Personal history of peptic ulcer disease: Secondary | ICD-10-CM | POA: Diagnosis not present

## 2013-08-06 DIAGNOSIS — C50919 Malignant neoplasm of unspecified site of unspecified female breast: Secondary | ICD-10-CM | POA: Diagnosis not present

## 2013-08-06 DIAGNOSIS — I4891 Unspecified atrial fibrillation: Secondary | ICD-10-CM | POA: Diagnosis not present

## 2013-08-06 DIAGNOSIS — I219 Acute myocardial infarction, unspecified: Secondary | ICD-10-CM | POA: Diagnosis not present

## 2013-08-06 DIAGNOSIS — C3490 Malignant neoplasm of unspecified part of unspecified bronchus or lung: Secondary | ICD-10-CM | POA: Diagnosis not present

## 2013-08-06 DIAGNOSIS — G40909 Epilepsy, unspecified, not intractable, without status epilepticus: Secondary | ICD-10-CM | POA: Diagnosis present

## 2013-08-09 ENCOUNTER — Encounter: Payer: Self-pay | Admitting: Cardiology

## 2013-08-13 DIAGNOSIS — D649 Anemia, unspecified: Secondary | ICD-10-CM | POA: Diagnosis not present

## 2013-08-13 DIAGNOSIS — I2510 Atherosclerotic heart disease of native coronary artery without angina pectoris: Secondary | ICD-10-CM | POA: Diagnosis not present

## 2013-08-13 DIAGNOSIS — K2961 Other gastritis with bleeding: Secondary | ICD-10-CM | POA: Diagnosis not present

## 2013-08-13 DIAGNOSIS — M47817 Spondylosis without myelopathy or radiculopathy, lumbosacral region: Secondary | ICD-10-CM | POA: Diagnosis not present

## 2013-08-14 DIAGNOSIS — M47817 Spondylosis without myelopathy or radiculopathy, lumbosacral region: Secondary | ICD-10-CM | POA: Diagnosis not present

## 2013-08-14 DIAGNOSIS — K2961 Other gastritis with bleeding: Secondary | ICD-10-CM | POA: Diagnosis not present

## 2013-08-14 DIAGNOSIS — I2510 Atherosclerotic heart disease of native coronary artery without angina pectoris: Secondary | ICD-10-CM | POA: Diagnosis not present

## 2013-08-14 DIAGNOSIS — D649 Anemia, unspecified: Secondary | ICD-10-CM | POA: Diagnosis not present

## 2013-08-16 DIAGNOSIS — D649 Anemia, unspecified: Secondary | ICD-10-CM | POA: Diagnosis not present

## 2013-08-16 DIAGNOSIS — M47817 Spondylosis without myelopathy or radiculopathy, lumbosacral region: Secondary | ICD-10-CM | POA: Diagnosis not present

## 2013-08-16 DIAGNOSIS — I2510 Atherosclerotic heart disease of native coronary artery without angina pectoris: Secondary | ICD-10-CM | POA: Diagnosis not present

## 2013-08-16 DIAGNOSIS — K2961 Other gastritis with bleeding: Secondary | ICD-10-CM | POA: Diagnosis not present

## 2013-08-19 DIAGNOSIS — I2510 Atherosclerotic heart disease of native coronary artery without angina pectoris: Secondary | ICD-10-CM | POA: Diagnosis not present

## 2013-08-19 DIAGNOSIS — K2961 Other gastritis with bleeding: Secondary | ICD-10-CM | POA: Diagnosis not present

## 2013-08-19 DIAGNOSIS — D649 Anemia, unspecified: Secondary | ICD-10-CM | POA: Diagnosis not present

## 2013-08-19 DIAGNOSIS — M47817 Spondylosis without myelopathy or radiculopathy, lumbosacral region: Secondary | ICD-10-CM | POA: Diagnosis not present

## 2013-08-20 DIAGNOSIS — M47817 Spondylosis without myelopathy or radiculopathy, lumbosacral region: Secondary | ICD-10-CM | POA: Diagnosis not present

## 2013-08-20 DIAGNOSIS — K219 Gastro-esophageal reflux disease without esophagitis: Secondary | ICD-10-CM | POA: Diagnosis not present

## 2013-08-20 DIAGNOSIS — G40909 Epilepsy, unspecified, not intractable, without status epilepticus: Secondary | ICD-10-CM | POA: Diagnosis not present

## 2013-08-20 DIAGNOSIS — K2961 Other gastritis with bleeding: Secondary | ICD-10-CM | POA: Diagnosis not present

## 2013-08-20 DIAGNOSIS — K259 Gastric ulcer, unspecified as acute or chronic, without hemorrhage or perforation: Secondary | ICD-10-CM | POA: Diagnosis not present

## 2013-08-20 DIAGNOSIS — E209 Hypoparathyroidism, unspecified: Secondary | ICD-10-CM | POA: Diagnosis not present

## 2013-08-20 DIAGNOSIS — K920 Hematemesis: Secondary | ICD-10-CM | POA: Diagnosis not present

## 2013-08-20 DIAGNOSIS — R000 Tachycardia, unspecified: Secondary | ICD-10-CM | POA: Diagnosis not present

## 2013-08-20 DIAGNOSIS — D649 Anemia, unspecified: Secondary | ICD-10-CM | POA: Diagnosis not present

## 2013-08-20 DIAGNOSIS — C73 Malignant neoplasm of thyroid gland: Secondary | ICD-10-CM | POA: Diagnosis not present

## 2013-08-20 DIAGNOSIS — E039 Hypothyroidism, unspecified: Secondary | ICD-10-CM | POA: Diagnosis not present

## 2013-08-20 DIAGNOSIS — Z853 Personal history of malignant neoplasm of breast: Secondary | ICD-10-CM | POA: Diagnosis not present

## 2013-08-20 DIAGNOSIS — I2510 Atherosclerotic heart disease of native coronary artery without angina pectoris: Secondary | ICD-10-CM | POA: Diagnosis not present

## 2013-08-20 DIAGNOSIS — F0150 Vascular dementia without behavioral disturbance: Secondary | ICD-10-CM | POA: Diagnosis not present

## 2013-08-21 DIAGNOSIS — M47817 Spondylosis without myelopathy or radiculopathy, lumbosacral region: Secondary | ICD-10-CM | POA: Diagnosis not present

## 2013-08-21 DIAGNOSIS — D649 Anemia, unspecified: Secondary | ICD-10-CM | POA: Diagnosis not present

## 2013-08-21 DIAGNOSIS — I2510 Atherosclerotic heart disease of native coronary artery without angina pectoris: Secondary | ICD-10-CM | POA: Diagnosis not present

## 2013-08-21 DIAGNOSIS — K2961 Other gastritis with bleeding: Secondary | ICD-10-CM | POA: Diagnosis not present

## 2013-08-22 DIAGNOSIS — M47817 Spondylosis without myelopathy or radiculopathy, lumbosacral region: Secondary | ICD-10-CM | POA: Diagnosis not present

## 2013-08-22 DIAGNOSIS — I2510 Atherosclerotic heart disease of native coronary artery without angina pectoris: Secondary | ICD-10-CM | POA: Diagnosis not present

## 2013-08-22 DIAGNOSIS — D649 Anemia, unspecified: Secondary | ICD-10-CM | POA: Diagnosis not present

## 2013-08-22 DIAGNOSIS — K2961 Other gastritis with bleeding: Secondary | ICD-10-CM | POA: Diagnosis not present

## 2013-08-25 DIAGNOSIS — D649 Anemia, unspecified: Secondary | ICD-10-CM | POA: Diagnosis not present

## 2013-08-25 DIAGNOSIS — M47817 Spondylosis without myelopathy or radiculopathy, lumbosacral region: Secondary | ICD-10-CM | POA: Diagnosis not present

## 2013-08-25 DIAGNOSIS — K2961 Other gastritis with bleeding: Secondary | ICD-10-CM | POA: Diagnosis not present

## 2013-08-25 DIAGNOSIS — I2510 Atherosclerotic heart disease of native coronary artery without angina pectoris: Secondary | ICD-10-CM | POA: Diagnosis not present

## 2013-08-26 DIAGNOSIS — D649 Anemia, unspecified: Secondary | ICD-10-CM | POA: Diagnosis not present

## 2013-08-26 DIAGNOSIS — K2961 Other gastritis with bleeding: Secondary | ICD-10-CM | POA: Diagnosis not present

## 2013-08-26 DIAGNOSIS — M47817 Spondylosis without myelopathy or radiculopathy, lumbosacral region: Secondary | ICD-10-CM | POA: Diagnosis not present

## 2013-08-26 DIAGNOSIS — I2510 Atherosclerotic heart disease of native coronary artery without angina pectoris: Secondary | ICD-10-CM | POA: Diagnosis not present

## 2013-08-27 DIAGNOSIS — M47817 Spondylosis without myelopathy or radiculopathy, lumbosacral region: Secondary | ICD-10-CM | POA: Diagnosis not present

## 2013-08-27 DIAGNOSIS — K2961 Other gastritis with bleeding: Secondary | ICD-10-CM | POA: Diagnosis not present

## 2013-08-27 DIAGNOSIS — D649 Anemia, unspecified: Secondary | ICD-10-CM | POA: Diagnosis not present

## 2013-08-27 DIAGNOSIS — I2510 Atherosclerotic heart disease of native coronary artery without angina pectoris: Secondary | ICD-10-CM | POA: Diagnosis not present

## 2013-08-28 DIAGNOSIS — K2961 Other gastritis with bleeding: Secondary | ICD-10-CM | POA: Diagnosis not present

## 2013-08-28 DIAGNOSIS — K219 Gastro-esophageal reflux disease without esophagitis: Secondary | ICD-10-CM | POA: Diagnosis not present

## 2013-08-28 DIAGNOSIS — M47817 Spondylosis without myelopathy or radiculopathy, lumbosacral region: Secondary | ICD-10-CM | POA: Diagnosis not present

## 2013-08-28 DIAGNOSIS — D649 Anemia, unspecified: Secondary | ICD-10-CM | POA: Diagnosis not present

## 2013-08-28 DIAGNOSIS — I2510 Atherosclerotic heart disease of native coronary artery without angina pectoris: Secondary | ICD-10-CM | POA: Diagnosis not present

## 2013-08-28 DIAGNOSIS — E876 Hypokalemia: Secondary | ICD-10-CM | POA: Diagnosis not present

## 2013-09-02 DIAGNOSIS — I2510 Atherosclerotic heart disease of native coronary artery without angina pectoris: Secondary | ICD-10-CM | POA: Diagnosis not present

## 2013-09-02 DIAGNOSIS — K2961 Other gastritis with bleeding: Secondary | ICD-10-CM | POA: Diagnosis not present

## 2013-09-02 DIAGNOSIS — M47817 Spondylosis without myelopathy or radiculopathy, lumbosacral region: Secondary | ICD-10-CM | POA: Diagnosis not present

## 2013-09-02 DIAGNOSIS — D649 Anemia, unspecified: Secondary | ICD-10-CM | POA: Diagnosis not present

## 2013-09-03 DIAGNOSIS — M47817 Spondylosis without myelopathy or radiculopathy, lumbosacral region: Secondary | ICD-10-CM | POA: Diagnosis not present

## 2013-09-03 DIAGNOSIS — K2961 Other gastritis with bleeding: Secondary | ICD-10-CM | POA: Diagnosis not present

## 2013-09-03 DIAGNOSIS — I2510 Atherosclerotic heart disease of native coronary artery without angina pectoris: Secondary | ICD-10-CM | POA: Diagnosis not present

## 2013-09-03 DIAGNOSIS — D649 Anemia, unspecified: Secondary | ICD-10-CM | POA: Diagnosis not present

## 2013-09-09 DIAGNOSIS — K259 Gastric ulcer, unspecified as acute or chronic, without hemorrhage or perforation: Secondary | ICD-10-CM | POA: Diagnosis not present

## 2013-09-09 DIAGNOSIS — F0150 Vascular dementia without behavioral disturbance: Secondary | ICD-10-CM | POA: Diagnosis not present

## 2013-09-09 DIAGNOSIS — D649 Anemia, unspecified: Secondary | ICD-10-CM | POA: Diagnosis not present

## 2013-09-09 DIAGNOSIS — I2510 Atherosclerotic heart disease of native coronary artery without angina pectoris: Secondary | ICD-10-CM | POA: Diagnosis not present

## 2013-09-09 DIAGNOSIS — K920 Hematemesis: Secondary | ICD-10-CM | POA: Diagnosis not present

## 2013-09-09 DIAGNOSIS — R000 Tachycardia, unspecified: Secondary | ICD-10-CM | POA: Diagnosis not present

## 2013-09-09 DIAGNOSIS — K2961 Other gastritis with bleeding: Secondary | ICD-10-CM | POA: Diagnosis not present

## 2013-09-09 DIAGNOSIS — M47817 Spondylosis without myelopathy or radiculopathy, lumbosacral region: Secondary | ICD-10-CM | POA: Diagnosis not present

## 2013-09-09 DIAGNOSIS — Z853 Personal history of malignant neoplasm of breast: Secondary | ICD-10-CM | POA: Diagnosis not present

## 2013-09-09 DIAGNOSIS — G40909 Epilepsy, unspecified, not intractable, without status epilepticus: Secondary | ICD-10-CM | POA: Diagnosis not present

## 2013-09-09 DIAGNOSIS — E209 Hypoparathyroidism, unspecified: Secondary | ICD-10-CM | POA: Diagnosis not present

## 2013-09-09 DIAGNOSIS — C73 Malignant neoplasm of thyroid gland: Secondary | ICD-10-CM | POA: Diagnosis not present

## 2013-09-15 ENCOUNTER — Other Ambulatory Visit: Payer: Self-pay | Admitting: Physician Assistant

## 2013-09-17 DIAGNOSIS — D649 Anemia, unspecified: Secondary | ICD-10-CM | POA: Diagnosis not present

## 2013-09-17 DIAGNOSIS — M47817 Spondylosis without myelopathy or radiculopathy, lumbosacral region: Secondary | ICD-10-CM | POA: Diagnosis not present

## 2013-09-17 DIAGNOSIS — K2961 Other gastritis with bleeding: Secondary | ICD-10-CM | POA: Diagnosis not present

## 2013-09-17 DIAGNOSIS — I2510 Atherosclerotic heart disease of native coronary artery without angina pectoris: Secondary | ICD-10-CM | POA: Diagnosis not present

## 2013-09-25 ENCOUNTER — Encounter: Payer: Self-pay | Admitting: Cardiology

## 2013-09-25 ENCOUNTER — Ambulatory Visit (INDEPENDENT_AMBULATORY_CARE_PROVIDER_SITE_OTHER): Payer: Medicare Other | Admitting: Cardiology

## 2013-09-25 VITALS — BP 124/80 | HR 99 | Ht 61.0 in | Wt 177.4 lb

## 2013-09-25 DIAGNOSIS — I4891 Unspecified atrial fibrillation: Secondary | ICD-10-CM

## 2013-09-25 DIAGNOSIS — I340 Nonrheumatic mitral (valve) insufficiency: Secondary | ICD-10-CM

## 2013-09-25 DIAGNOSIS — I059 Rheumatic mitral valve disease, unspecified: Secondary | ICD-10-CM | POA: Diagnosis not present

## 2013-09-25 DIAGNOSIS — R079 Chest pain, unspecified: Secondary | ICD-10-CM | POA: Diagnosis not present

## 2013-09-25 NOTE — Patient Instructions (Signed)
Your physician recommends that you schedule a follow-up appointment in: 6 weeks with Dr. Wyline Mood. This appointment will be scheduled today before you leave.  Your physician recommends that you continue on your current medications as directed. Please refer to the Current Medication list given to you today.

## 2013-09-25 NOTE — Progress Notes (Signed)
Clinical Summary Ms. Citron is a 67 y.o.female last seen by Dr. Shirlee Latch, this is our first visit together. She was seen for the following medical problems.   1. Chest pain - noted on last visit 03/2013 with Dr Jearld Pies - low risk ETT myoview 09/2012, small partially reversible anterior defect possibly due to attenuation - echo 09/2012 LVEF 60-65%, moderate MR - new diagnosis of gastric ulcers, previously taking a lot of NSAIDs. Symptoms thought to be GI, she is being followed by GI   2. Afib - new diagnosis during recent admission to [facility] with severe anemia, hypothyroidism, and electrolyte abnormalities.. Afib is not mentioned on the discharge summary, however per patient report and review of the EKG it is evident.    - EKG from [facility] shows afib rates 150. This was found in the setting of significant thyroid and electrolyte abnormalities.  - occasional feeling of palpitations, lasts for 5-10 minutes. +fatigue, mild dizziness. Occurs few times a week. - not on any rate controlling agents, not on anticoag due to severe GI bleed    Past Medical History  Diagnosis Date  . Vascular dementia, uncomplicated   . Unspecified epilepsy without mention of intractable epilepsy   . Tachycardia, unspecified   . Hypoparathyroidism   . Malignant neoplasm of thyroid gland   . Personal history of malignant neoplasm of breast   . Unspecified hypothyroidism   . Esophageal reflux   . Unspecified urinary incontinence   . Insomnia, unspecified   . Migraine   . Anxiety state, unspecified   . Hypopotassemia   . Wolff-Parkinson-White (WPW) syndrome   . Mitral regurgitation     Moderate, echo, 01/2012  . HLD (hyperlipidemia)      Allergies  Allergen Reactions  . Adhesive [Tape]   . Asa [Aspirin] Other (See Comments)    ulcers  . Cyclobenzaprine   . Imdur [Isosorbide]     Headache, and rash   . Iodine   . Pentazocine   . Propoxyphene And Methadone   . Septra  [Sulfamethoxazole-Trimethoprim]   . Sulfa Antibiotics   . Tegretol [Carbamazepine]   . Tetracyclines & Related      Current Outpatient Prescriptions  Medication Sig Dispense Refill  . acetaminophen (TYLENOL) 500 MG tablet Take 500 mg by mouth 3 (three) times daily as needed.      Marland Kitchen aspirin 81 MG tablet Take 81 mg by mouth daily.      . butalbital-aspirin-caffeine (FIORINAL) 50-325-40 MG per tablet Take 1 tablet by mouth at bedtime.      . cetirizine (ZYRTEC) 10 MG tablet Take 10 mg by mouth daily.      . clonazePAM (KLONOPIN) 0.5 MG tablet Take 0.5 mg by mouth 3 (three) times daily.      . diphenoxylate-atropine (LOMOTIL) 2.5-0.025 MG per tablet Take 1 tablet by mouth as needed for diarrhea or loose stools.      . DULoxetine (CYMBALTA) 60 MG capsule Take 60 mg by mouth 2 (two) times daily.      Marland Kitchen esomeprazole (NEXIUM) 40 MG capsule Take 40 mg by mouth daily.      . isosorbide mononitrate (IMDUR) 30 MG 24 hr tablet TAKE ONE TABLET BY MOUTH ONCE DAILY  30 tablet  3  . levothyroxine (SYNTHROID, LEVOTHROID) 125 MCG tablet Take 125 mcg by mouth daily.      Marland Kitchen MAGNESIUM CHLORIDE ER PO Take 1 tablet by mouth daily.       . nitroGLYCERIN (NITROSTAT) 0.4 MG SL  tablet Place 1 tablet (0.4 mg total) under the tongue every 5 (five) minutes as needed.  25 tablet  3  . PHENObarbital (LUMINAL) 30 MG tablet Take 30 mg by mouth 3 (three) times daily.      . potassium chloride (K-DUR) 10 MEQ tablet Take 10 mEq by mouth daily.      . promethazine (PHENERGAN) 25 MG tablet Take 25 mg by mouth every 6 (six) hours as needed.      . risperiDONE (RISPERDAL) 3 MG tablet Take 3 mg by mouth 2 (two) times daily.      Marland Kitchen rOPINIRole (REQUIP) 2 MG tablet Take 2 mg by mouth at bedtime.      . solifenacin (VESICARE) 10 MG tablet Take 10 mg by mouth daily.      . SUMAtriptan (IMITREX) 100 MG tablet Take 100 mg by mouth every 2 (two) hours as needed.      . Vitamin D, Ergocalciferol, (DRISDOL) 50000 UNITS CAPS Take 1 capsule  (50,000 Units total) by mouth every 7 (seven) days. Start on 10/31/2012.  10 capsule  1  . zolpidem (AMBIEN CR) 6.25 MG CR tablet Take 6.25 mg by mouth at bedtime.        No current facility-administered medications for this visit.     No past surgical history on file.   Allergies  Allergen Reactions  . Adhesive [Tape]   . Asa [Aspirin] Other (See Comments)    ulcers  . Cyclobenzaprine   . Imdur [Isosorbide]     Headache, and rash   . Iodine   . Pentazocine   . Propoxyphene And Methadone   . Septra [Sulfamethoxazole-Trimethoprim]   . Sulfa Antibiotics   . Tegretol [Carbamazepine]   . Tetracyclines & Related       Family History  Problem Relation Age of Onset  . Cancer Mother   . Hyperlipidemia Father   . Cancer Father      Social History Ms. Coppens reports that she has never smoked. She does not have any smokeless tobacco history on file. Ms. Canal reports that she does not drink alcohol.   Review of Systems CONSTITUTIONAL: No weight loss, fever, chills, weakness or fatigue.  HEENT: Eyes: No visual loss, blurred vision, double vision or yellow sclerae.No hearing loss, sneezing, congestion, runny nose or sore throat.  SKIN: No rash or itching.  CARDIOVASCULAR: per HPI RESPIRATORY: No shortness of breath, cough or sputum.  GASTROINTESTINAL: No anorexia, nausea, vomiting or diarrhea. No abdominal pain or blood.  GENITOURINARY: No burning on urination, no polyuria NEUROLOGICAL: No headache, dizziness, syncope, paralysis, ataxia, numbness or tingling in the extremities. No change in bowel or bladder control.  MUSCULOSKELETAL: No muscle, back pain, joint pain or stiffness.  LYMPHATICS: No enlarged nodes. No history of splenectomy.  PSYCHIATRIC: No history of depression or anxiety.  ENDOCRINOLOGIC: No reports of sweating, cold or heat intolerance. No polyuria or polydipsia.  Marland Kitchen   Physical Examination p 99 bp Wt 177 lbs BMI 34 Gen: resting comfortably, no acute  distress HEENT: no scleral icterus, pupils equal round and reactive, no palptable cervical adenopathy,  CV: RRR, no m/r/g, no JVD, no carotid bruits Resp: Clear to auscultation bilaterally GI: abdomen is soft, non-tender, non-distended, normal bowel sounds, no hepatosplenomegaly MSK: extremities are warm, no edema.  Skin: warm, no rash Neuro:  no focal deficits Psych: appropriate affect   Diagnostic Studies     Assessment and Plan   1. Chest pain:  - atypical with low risk myoview  recently - continue risk factor modification  2. Afib - new diagnosis made during recent admission to [facility] in setting of severe anemia, GI bleed, hypothyroidism, and electrolyte abnormalities - she describes history of palpitations that suggest this may be a chronic problem, unclear if only occurred in the acute setting described above - she is not anticoag candidate given her recent severe GI bleed - in her PMH is listed a history of WPW, I am not sure the details of this. Her reviewed EKGs have PR intervals 130s-140s. She does is not aware of this history, though she is somewhat of a poor historian due to her dementia - she does report seeing previous cardiologists at [facility] and [facility] with previous caths done, as well as an unclear history of cardiac arrest vs seizures - will request records from [place] and [facility], hold off on starting any AV nodal blocking agents until her history is better understood.   3. Mitral regurgitation - will need repeat echo in future - no current symptoms.    Follow up 6 weeks   Antoine Poche, M.D., F.A.C.C.

## 2013-10-07 DIAGNOSIS — D649 Anemia, unspecified: Secondary | ICD-10-CM | POA: Diagnosis not present

## 2013-10-07 DIAGNOSIS — I2510 Atherosclerotic heart disease of native coronary artery without angina pectoris: Secondary | ICD-10-CM | POA: Diagnosis not present

## 2013-10-07 DIAGNOSIS — M47817 Spondylosis without myelopathy or radiculopathy, lumbosacral region: Secondary | ICD-10-CM | POA: Diagnosis not present

## 2013-10-07 DIAGNOSIS — K2961 Other gastritis with bleeding: Secondary | ICD-10-CM | POA: Diagnosis not present

## 2013-10-16 ENCOUNTER — Other Ambulatory Visit: Payer: Self-pay | Admitting: Cardiology

## 2013-11-04 DIAGNOSIS — C73 Malignant neoplasm of thyroid gland: Secondary | ICD-10-CM | POA: Diagnosis not present

## 2013-11-04 DIAGNOSIS — G40909 Epilepsy, unspecified, not intractable, without status epilepticus: Secondary | ICD-10-CM | POA: Diagnosis not present

## 2013-11-04 DIAGNOSIS — Z853 Personal history of malignant neoplasm of breast: Secondary | ICD-10-CM | POA: Diagnosis not present

## 2013-11-04 DIAGNOSIS — K920 Hematemesis: Secondary | ICD-10-CM | POA: Diagnosis not present

## 2013-11-04 DIAGNOSIS — K259 Gastric ulcer, unspecified as acute or chronic, without hemorrhage or perforation: Secondary | ICD-10-CM | POA: Diagnosis not present

## 2013-11-04 DIAGNOSIS — E039 Hypothyroidism, unspecified: Secondary | ICD-10-CM | POA: Diagnosis not present

## 2013-11-04 DIAGNOSIS — R000 Tachycardia, unspecified: Secondary | ICD-10-CM | POA: Diagnosis not present

## 2013-11-04 DIAGNOSIS — F0150 Vascular dementia without behavioral disturbance: Secondary | ICD-10-CM | POA: Diagnosis not present

## 2013-11-04 DIAGNOSIS — E209 Hypoparathyroidism, unspecified: Secondary | ICD-10-CM | POA: Diagnosis not present

## 2013-11-12 DIAGNOSIS — K920 Hematemesis: Secondary | ICD-10-CM | POA: Diagnosis not present

## 2013-11-12 DIAGNOSIS — D509 Iron deficiency anemia, unspecified: Secondary | ICD-10-CM | POA: Diagnosis not present

## 2013-11-12 DIAGNOSIS — K259 Gastric ulcer, unspecified as acute or chronic, without hemorrhage or perforation: Secondary | ICD-10-CM | POA: Diagnosis not present

## 2013-11-13 ENCOUNTER — Ambulatory Visit (INDEPENDENT_AMBULATORY_CARE_PROVIDER_SITE_OTHER): Payer: Medicare Other | Admitting: Cardiology

## 2013-11-13 ENCOUNTER — Encounter: Payer: Self-pay | Admitting: Cardiology

## 2013-11-13 VITALS — BP 127/82 | HR 93 | Ht 61.0 in | Wt 176.0 lb

## 2013-11-13 DIAGNOSIS — I4891 Unspecified atrial fibrillation: Secondary | ICD-10-CM | POA: Diagnosis not present

## 2013-11-13 DIAGNOSIS — I059 Rheumatic mitral valve disease, unspecified: Secondary | ICD-10-CM

## 2013-11-13 DIAGNOSIS — R079 Chest pain, unspecified: Secondary | ICD-10-CM

## 2013-11-13 DIAGNOSIS — I340 Nonrheumatic mitral (valve) insufficiency: Secondary | ICD-10-CM

## 2013-11-13 NOTE — Progress Notes (Signed)
Clinical Summary Samantha Odonnell is a 68 y.o.female last seen by Dr. Aundra Dubin, this is our first visit together. She was seen for the following medical problems.   1. Chest pain  - noted on last visit 03/2013 with Dr Marigene Ehlers  - low risk ETT myoview 09/2012, small partially reversible anterior defect possibly due to attenuation  - echo 09/2012 LVEF 60-65%, moderate MR  - new diagnosis of gastric ulcers, previously taking a lot of NSAIDs. Symptoms thought to be GI, she is being followed by GI   2. Afib  - new diagnosis during recent admission to [facility] with severe anemia, hypothyroidism, and electrolyte abnormalities.. Afib is not mentioned on the discharge summary, however per patient report and review of the EKG it is evident.  - EKG from [facility] shows afib rates 150. This was found in the setting of significant thyroid and electrolyte abnormalities.  - denies any palpitations. No significant SOB. Can have some occasional lightheadedness, mainly with standing quickly.  - occasional feeling of palpitations, lasts for 5-10 minutes. - not on any rate controlling agents, not on anticoag due to severe GI bleed   Past Medical History  Diagnosis Date  . Vascular dementia, uncomplicated   . Unspecified epilepsy without mention of intractable epilepsy   . Tachycardia, unspecified   . Hypoparathyroidism   . Malignant neoplasm of thyroid gland   . Personal history of malignant neoplasm of breast   . Unspecified hypothyroidism   . Esophageal reflux   . Unspecified urinary incontinence   . Insomnia, unspecified   . Migraine   . Anxiety state, unspecified   . Hypopotassemia   . Wolff-Parkinson-White (WPW) syndrome   . Mitral regurgitation     Moderate, echo, 01/2012  . HLD (hyperlipidemia)      Allergies  Allergen Reactions  . Adhesive [Tape]   . Asa [Aspirin] Other (See Comments)    ulcers  . Cyclobenzaprine   . Imdur [Isosorbide]     Headache, and rash   . Iodine   . Pentazocine    . Propoxyphene And Methadone   . Septra [Sulfamethoxazole-Trimethoprim]   . Sulfa Antibiotics   . Tegretol [Carbamazepine]   . Tetracyclines & Related      Current Outpatient Prescriptions  Medication Sig Dispense Refill  . acetaminophen (TYLENOL) 500 MG tablet Take 500 mg by mouth 3 (three) times daily as needed.      Marland Kitchen aspirin 81 MG tablet Take 81 mg by mouth daily.      . butalbital-aspirin-caffeine (FIORINAL) 50-325-40 MG per tablet Take 1 tablet by mouth at bedtime as needed.       . cetirizine (ZYRTEC) 10 MG tablet Take 10 mg by mouth daily.      . clonazePAM (KLONOPIN) 0.5 MG tablet Take 0.5 mg by mouth 3 (three) times daily.      . diphenoxylate-atropine (LOMOTIL) 2.5-0.025 MG per tablet Take 1 tablet by mouth as needed for diarrhea or loose stools.      . DULoxetine (CYMBALTA) 60 MG capsule Take 60 mg by mouth 2 (two) times daily.      Marland Kitchen esomeprazole (NEXIUM) 40 MG capsule Take 40 mg by mouth daily.      . isosorbide mononitrate (IMDUR) 30 MG 24 hr tablet TAKE ONE TABLET BY MOUTH ONCE DAILY  30 tablet  3  . levothyroxine (SYNTHROID, LEVOTHROID) 150 MCG tablet Take 150 mcg by mouth daily before breakfast.      . MAGNESIUM CHLORIDE ER PO Take 1  tablet by mouth daily.       . nitroGLYCERIN (NITROSTAT) 0.4 MG SL tablet Place 1 tablet (0.4 mg total) under the tongue every 5 (five) minutes as needed.  25 tablet  3  . PHENObarbital (LUMINAL) 30 MG tablet Take 30 mg by mouth 3 (three) times daily.      . potassium chloride (K-DUR) 10 MEQ tablet Take 10 mEq by mouth daily.      . promethazine (PHENERGAN) 25 MG tablet Take 25 mg by mouth every 4 (four) hours as needed.       . QC LO-DOSE ASPIRIN 81 MG EC tablet TAKE ONE TABLET BY MOUTH ONCE DAILY  30 tablet  0  . rOPINIRole (REQUIP) 2 MG tablet Take 2 mg by mouth at bedtime.      . solifenacin (VESICARE) 10 MG tablet Take 10 mg by mouth daily.      . SUMAtriptan (IMITREX) 100 MG tablet Take 100 mg by mouth every 2 (two) hours as needed.       Marland Kitchen tiZANidine (ZANAFLEX) 4 MG tablet Take 4 mg by mouth every 8 (eight) hours as needed for muscle spasms.      . Vitamin D, Ergocalciferol, (DRISDOL) 50000 UNITS CAPS Take 1 capsule (50,000 Units total) by mouth every 7 (seven) days. Start on 10/31/2012.  10 capsule  1  . zolpidem (AMBIEN CR) 6.25 MG CR tablet Take 6.25 mg by mouth at bedtime.        No current facility-administered medications for this visit.     No past surgical history on file.   Allergies  Allergen Reactions  . Adhesive [Tape]   . Asa [Aspirin] Other (See Comments)    ulcers  . Cyclobenzaprine   . Imdur [Isosorbide]     Headache, and rash   . Iodine   . Pentazocine   . Propoxyphene And Methadone   . Septra [Sulfamethoxazole-Trimethoprim]   . Sulfa Antibiotics   . Tegretol [Carbamazepine]   . Tetracyclines & Related       Family History  Problem Relation Age of Onset  . Cancer Mother   . Hyperlipidemia Father   . Cancer Father      Social History Samantha Odonnell reports that she has never smoked. She does not have any smokeless tobacco history on file. Samantha Odonnell reports that she does not drink alcohol.   Review of Systems CONSTITUTIONAL: No weight loss, fever, chills, weakness or fatigue.  HEENT: Eyes: No visual loss, blurred vision, double vision or yellow sclerae.No hearing loss, sneezing, congestion, runny nose or sore throat.  SKIN: No rash or itching.  CARDIOVASCULAR: per HPI  RESPIRATORY: No shortness of breath, cough or sputum.  GASTROINTESTINAL: No anorexia, nausea, vomiting or diarrhea. No abdominal pain or blood.  GENITOURINARY: No burning on urination, no polyuria NEUROLOGICAL: No headache, dizziness, syncope, paralysis, ataxia, numbness or tingling in the extremities. No change in bowel or bladder control.  MUSCULOSKELETAL: No muscle, back pain, joint pain or stiffness.  LYMPHATICS: No enlarged nodes. No history of splenectomy.  PSYCHIATRIC: No history of depression or anxiety.    ENDOCRINOLOGIC: No reports of sweating, cold or heat intolerance. No polyuria or polydipsia.  Marland Kitchen   Physical Examination p 90 bp 127/82 Wt176 lbs BMI 33 Gen: resting comfortably, no acute distress HEENT: no scleral icterus, pupils equal round and reactive, no palptable cervical adenopathy,  CV: RRR, no m/r/g, no JVD, no carotid bruits Resp: Clear to auscultation bilaterally GI: abdomen is soft, non-tender, non-distended, normal bowel  sounds, no hepatosplenomegaly MSK: extremities are warm, no edema.  Skin: warm, no rash Neuro:  no focal deficits Psych: appropriate affect   Diagnostic Studies     Assessment and Plan   1. Chest pain:  - atypical with low risk myoview recently  - continue risk factor modification  - stop imdur as there is no indication  2. Afib  - new diagnosis made during recent admission to [facility] in setting of severe anemia, GI bleed, hypothyroidism, and electrolyte abnormalities  - she is not anticoag candidate given her recent severe GI bleed - once bleeding issues resolved, will obtain monitor to evaluate for recurrence of afib and consider anticoag at that time. - in her PMH is listed a history of WPW, I am not sure the details of this. Her reviewed EKGs have PR intervals 130s-140s. She does is not aware of this history, though she is somewhat of a poor historian due to her dementia  - she does report seeing previous cardiologists at [facility] and [facility] with previous caths done, as well as an unclear history of cardiac arrest vs seizures  - will request records from [place] and [place]  3. Mitral regurgitation  - will need repeat echo in future  - no current symptoms.       Arnoldo Lenis, M.D., F.A.C.C.

## 2013-11-13 NOTE — Patient Instructions (Signed)
Your physician recommends that you schedule a follow-up appointment in: 7 months. You will receive a reminder letter in the mail in about 4 months reminding you to call and schedule your appointment. If you don't receive this letter, please contact our office. Your physician has recommended you make the following change in your medication: STOP ISOSORBIDE MONONITRATE. All other medications will remain the same.

## 2013-11-28 DIAGNOSIS — K259 Gastric ulcer, unspecified as acute or chronic, without hemorrhage or perforation: Secondary | ICD-10-CM | POA: Diagnosis not present

## 2013-11-28 DIAGNOSIS — M1990 Unspecified osteoarthritis, unspecified site: Secondary | ICD-10-CM | POA: Diagnosis not present

## 2013-11-28 DIAGNOSIS — D509 Iron deficiency anemia, unspecified: Secondary | ICD-10-CM | POA: Diagnosis not present

## 2013-12-11 ENCOUNTER — Other Ambulatory Visit: Payer: Self-pay | Admitting: Cardiology

## 2014-02-06 DIAGNOSIS — R112 Nausea with vomiting, unspecified: Secondary | ICD-10-CM | POA: Diagnosis not present

## 2014-02-06 DIAGNOSIS — D649 Anemia, unspecified: Secondary | ICD-10-CM | POA: Diagnosis not present

## 2014-02-12 ENCOUNTER — Ambulatory Visit (INDEPENDENT_AMBULATORY_CARE_PROVIDER_SITE_OTHER): Payer: Medicare Other | Admitting: Cardiology

## 2014-02-12 ENCOUNTER — Encounter: Payer: Self-pay | Admitting: Cardiology

## 2014-02-12 VITALS — BP 150/84 | HR 92 | Ht 61.0 in | Wt 168.0 lb

## 2014-02-12 DIAGNOSIS — I4891 Unspecified atrial fibrillation: Secondary | ICD-10-CM | POA: Diagnosis not present

## 2014-02-12 DIAGNOSIS — K219 Gastro-esophageal reflux disease without esophagitis: Secondary | ICD-10-CM | POA: Diagnosis not present

## 2014-02-12 DIAGNOSIS — F411 Generalized anxiety disorder: Secondary | ICD-10-CM | POA: Diagnosis not present

## 2014-02-12 DIAGNOSIS — R7309 Other abnormal glucose: Secondary | ICD-10-CM | POA: Diagnosis not present

## 2014-02-12 DIAGNOSIS — E039 Hypothyroidism, unspecified: Secondary | ICD-10-CM | POA: Diagnosis not present

## 2014-02-12 DIAGNOSIS — R002 Palpitations: Secondary | ICD-10-CM | POA: Diagnosis not present

## 2014-02-12 DIAGNOSIS — R079 Chest pain, unspecified: Secondary | ICD-10-CM

## 2014-02-12 DIAGNOSIS — E559 Vitamin D deficiency, unspecified: Secondary | ICD-10-CM | POA: Diagnosis not present

## 2014-02-12 DIAGNOSIS — E876 Hypokalemia: Secondary | ICD-10-CM | POA: Diagnosis not present

## 2014-02-12 NOTE — Patient Instructions (Signed)
Your physician has recommended that you wear a 14 day event monitor. Event monitors are medical devices that record the heart's electrical activity. Doctors most often [place] these monitors to diagnose arrhythmias. Arrhythmias are problems with the speed or rhythm of the heartbeat. The monitor is a small, portable device. You can wear one while you do your normal daily activities. This is usually used to diagnose what is causing palpitations/syncope (passing out). Office will contact with results via phone or letter.   Continue all current medications. Follow up in  3 months

## 2014-02-12 NOTE — Progress Notes (Signed)
Clinical Summary Samantha Odonnell is a 68 y.o.female seen today for follow up of the following medical problems.   1. Chest pain  - long history of chest pain according to notes - obtained some notes from [facility] in 2010, reference chest pain at that time, a cath in 2008 with normal coronaries in [place] - low risk ETT myoview 09/2012, small partially reversible anterior defect possibly due to attenuation  - echo 09/2012 LVEF 60-65%, moderate MR  - new diagnosis of gastric ulcers, previously taking a lot of NSAIDs. Symptoms thought to be GI, she is being followed by GI    2. Afib  - new diagnosis during recent admission to [facility] with severe anemia, hypothyroidism, and electrolyte abnormalities.. Afib is not mentioned on the discharge summary, however per patient report and review of the EKG it is evident.  - EKG from [facility] shows afib rates 150. This was found in the setting of significant thyroid and electrolyte abnormalities.  - denies any palpitations. No significant SOB. - occasional feeling of palpitations, lasts for 5-10 minutes.  - not on any rate controlling agents, not on anticoag due to history of severe GI bleed   Past Medical History  Diagnosis Date  . Vascular dementia, uncomplicated   . Unspecified epilepsy without mention of intractable epilepsy   . Tachycardia, unspecified   . Hypoparathyroidism   . Malignant neoplasm of thyroid gland   . Personal history of malignant neoplasm of breast   . Unspecified hypothyroidism   . Esophageal reflux   . Unspecified urinary incontinence   . Insomnia, unspecified   . Migraine   . Anxiety state, unspecified   . Hypopotassemia   . Wolff-Parkinson-White (WPW) syndrome   . Mitral regurgitation     Moderate, echo, 01/2012  . HLD (hyperlipidemia)      Allergies  Allergen Reactions  . Adhesive [Tape]   . Asa [Aspirin] Other (See Comments)    ulcers  . Cyclobenzaprine   . Imdur [Isosorbide]    Headache, and rash   . Iodine   . Pentazocine   . Propoxyphene And Methadone   . Septra [Sulfamethoxazole-Trimethoprim]   . Sulfa Antibiotics   . Tegretol [Carbamazepine]   . Tetracyclines & Related      Current Outpatient Prescriptions  Medication Sig Dispense Refill  . acetaminophen (TYLENOL) 500 MG tablet Take 500 mg by mouth 3 (three) times daily as needed.      Marland Kitchen aspirin 81 MG tablet Take 81 mg by mouth daily.      . butalbital-aspirin-caffeine (FIORINAL) 50-325-40 MG per tablet Take 1 tablet by mouth at bedtime as needed.       . cetirizine (ZYRTEC) 10 MG tablet Take 10 mg by mouth daily.      . clonazePAM (KLONOPIN) 0.5 MG tablet Take 0.5 mg by mouth 3 (three) times daily.      . diphenoxylate-atropine (LOMOTIL) 2.5-0.025 MG per tablet Take 1 tablet by mouth as needed for diarrhea or loose stools.      . DULoxetine (CYMBALTA) 60 MG capsule Take 60 mg by mouth 2 (two) times daily.      Marland Kitchen esomeprazole (NEXIUM) 40 MG capsule Take 40 mg by mouth daily.      Marland Kitchen levothyroxine (SYNTHROID, LEVOTHROID) 150 MCG tablet Take 150 mcg by mouth daily before breakfast.      . MAGNESIUM CHLORIDE ER PO Take 1 tablet by mouth daily.       . nitroGLYCERIN (NITROSTAT) 0.4  MG SL tablet Place 1 tablet (0.4 mg total) under the tongue every 5 (five) minutes as needed.  25 tablet  3  . PHENObarbital (LUMINAL) 30 MG tablet Take 30 mg by mouth 3 (three) times daily.      . potassium chloride (K-DUR) 10 MEQ tablet Take 10 mEq by mouth daily.      . promethazine (PHENERGAN) 25 MG tablet Take 25 mg by mouth every 4 (four) hours as needed.       . QC LO-DOSE ASPIRIN 81 MG EC tablet TAKE ONE TABLET BY MOUTH ONCE DAILY  30 tablet  6  . rOPINIRole (REQUIP) 2 MG tablet Take 2 mg by mouth 2 (two) times daily.       . solifenacin (VESICARE) 10 MG tablet Take 10 mg by mouth daily.      . SUMAtriptan (IMITREX) 25 MG tablet Take 1 tablet by mouth every 2 (two) hours as needed.      . SUMAtriptan 6 MG/0.5ML SOAJ Inject  6 mg into the skin as needed.      Marland Kitchen tiZANidine (ZANAFLEX) 4 MG tablet Take 4 mg by mouth every 8 (eight) hours as needed for muscle spasms.      . Vitamin D, Ergocalciferol, (DRISDOL) 50000 UNITS CAPS Take 1 capsule (50,000 Units total) by mouth every 7 (seven) days. Start on 10/31/2012.  10 capsule  1  . zolpidem (AMBIEN CR) 6.25 MG CR tablet Take 6.25 mg by mouth at bedtime.        No current facility-administered medications for this visit.     No past surgical history on file.   Allergies  Allergen Reactions  . Adhesive [Tape]   . Asa [Aspirin] Other (See Comments)    ulcers  . Cyclobenzaprine   . Imdur [Isosorbide]     Headache, and rash   . Iodine   . Pentazocine   . Propoxyphene And Methadone   . Septra [Sulfamethoxazole-Trimethoprim]   . Sulfa Antibiotics   . Tegretol [Carbamazepine]   . Tetracyclines & Related       Family History  Problem Relation Age of Onset  . Cancer Mother   . Hyperlipidemia Father   . Cancer Father      Social History [name] reports that she has never smoked. She has never used smokeless tobacco. [name] reports that she does not drink alcohol.   Review of Systems CONSTITUTIONAL: No weight loss, fever, chills, weakness or fatigue.  HEENT: Eyes: No visual loss, blurred vision, double vision or yellow sclerae.No hearing loss, sneezing, congestion, runny nose or sore throat.  SKIN: No rash or itching.  CARDIOVASCULAR: per HPI RESPIRATORY: No shortness of breath, cough or sputum.  GASTROINTESTINAL:+ N/V.  GENITOURINARY: No burning on urination, no polyuria NEUROLOGICAL: No headache, dizziness, syncope, paralysis, ataxia, numbness or tingling in the extremities. No change in bowel or bladder control.  MUSCULOSKELETAL: No muscle, back pain, joint pain or stiffness.  LYMPHATICS: No enlarged nodes. No history of splenectomy.  PSYCHIATRIC: No history of depression or anxiety.  ENDOCRINOLOGIC: No reports of sweating, cold or heat  intolerance. No polyuria or polydipsia.  Marland Kitchen   Physical Examination p92 bp 150/84 Wt 168 lbs BMI 32 Gen: resting comfortably, no acute distress HEENT: no scleral icterus, pupils equal round and reactive, no palptable cervical adenopathy,  CV: RRR, no m/r/g, no JVD, no carotid bruits Resp: Clear to auscultation bilaterally GI: abdomen is soft, non-tender, non-distended, normal bowel sounds, no hepatosplenomegaly MSK: extremities are warm, no edema.  Skin: warm, no rash Neuro:  no focal deficits Psych: appropriate affect   Diagnostic Studies 09/2012 MPI Overall Impression: Low risk stress nuclear study with a small, medium intensity, partially reversible anterior/apical defect consistent with soft tissue attenuation and mild ischemia.  LV Ejection Fraction: 68%. LV Wall Motion: NL LV Function; NL Wall Motion   09/2012 Echo - Left ventricle: The cavity size was normal. Wall thickness was increased in a pattern of mild LVH. Systolic function was normal. The estimated ejection fraction was in the range of 60% to 65%. Wall motion was normal; there were no regional wall motion abnormalities. Findings consistent with left ventricular diastolic dysfunction. - Aortic valve: Trileaflet. Trivial regurgitation. Mean gradient: 61mm Hg (S). - Mitral valve: Calcified annulus. Moderate regurgitation directed eccentrically - best seen in the apical views. Otherwise appears mild. Regurgitant volume: 90ml (PISA). - Left atrium: The atrium was mildly to moderately dilated. - Tricuspid valve: Mild regurgitation. - Pulmonary arteries: PA peak pressure: 33mm Hg (S). - Pericardium, extracardiac: A prominent pericardial fat pad was present.  Cath 2008 [facility] Normal coronaies (see media section of epic)  Assessment and Plan   1. Chest pain:  - atypical with low risk myoview recently  - continue risk factor modification  - will requestion her note from GI  2. Afib  - new diagnosis made  during recent admission to [facility] in setting of severe anemia, GI bleed, hypothyroidism, and electrolyte abnormalities  - she is not anticoag candidate given her recent severe GI bleed  - describes palpitations at times, will obtain 14 day monitor to see if any afib recurrence.   - in her PMH is listed a history of WPW, I am not sure the details of this. Her reviewed EKGs have PR intervals 130s-140s. She does is not aware of this history, though she is somewhat of a poor historian due to her dementia   3. Mitral regurgitation  - will need repeat echo in future  - no current symptoms.    F/u 3 months   Arnoldo Lenis, M.D., F.A.C.C.

## 2014-02-19 ENCOUNTER — Other Ambulatory Visit: Payer: Self-pay | Admitting: *Deleted

## 2014-02-19 DIAGNOSIS — E209 Hypoparathyroidism, unspecified: Secondary | ICD-10-CM | POA: Diagnosis not present

## 2014-02-19 DIAGNOSIS — G40909 Epilepsy, unspecified, not intractable, without status epilepticus: Secondary | ICD-10-CM | POA: Diagnosis not present

## 2014-02-19 DIAGNOSIS — D509 Iron deficiency anemia, unspecified: Secondary | ICD-10-CM | POA: Diagnosis not present

## 2014-02-19 DIAGNOSIS — F0150 Vascular dementia without behavioral disturbance: Secondary | ICD-10-CM | POA: Diagnosis not present

## 2014-02-19 DIAGNOSIS — M1990 Unspecified osteoarthritis, unspecified site: Secondary | ICD-10-CM | POA: Diagnosis not present

## 2014-02-19 DIAGNOSIS — R000 Tachycardia, unspecified: Secondary | ICD-10-CM | POA: Diagnosis not present

## 2014-02-19 DIAGNOSIS — K259 Gastric ulcer, unspecified as acute or chronic, without hemorrhage or perforation: Secondary | ICD-10-CM | POA: Diagnosis not present

## 2014-02-19 DIAGNOSIS — R002 Palpitations: Secondary | ICD-10-CM

## 2014-02-19 DIAGNOSIS — C73 Malignant neoplasm of thyroid gland: Secondary | ICD-10-CM | POA: Diagnosis not present

## 2014-02-25 DIAGNOSIS — D485 Neoplasm of uncertain behavior of skin: Secondary | ICD-10-CM | POA: Diagnosis not present

## 2014-02-25 DIAGNOSIS — L259 Unspecified contact dermatitis, unspecified cause: Secondary | ICD-10-CM | POA: Diagnosis not present

## 2014-03-09 ENCOUNTER — Telehealth: Payer: Self-pay | Admitting: Cardiology

## 2014-03-09 NOTE — Telephone Encounter (Signed)
Patient is being treated for scabies (Dr. Tarri Glenn) daughter is concerned about her wearing the heart monitor. Please call Samantha Odonnell (daughter)  [phone].

## 2014-03-09 NOTE — Telephone Encounter (Signed)
Left message to return call 

## 2014-03-10 NOTE — Telephone Encounter (Signed)
Discussed issue below with daughter Anderson Malta).  States patient lives at [facility] and has been treated x 1, but her room-mate now has scabies too.  They both are being treated currently.  Stated that the monitor itself had not been on her body yet, but had been on her bed.  Advised her to call e-cardio for advice as I am not sure what type of cleaning methods are used on those monitors.  Informed her that I definitely agree to hold on the monitor until she gets the "all clear" notification from her treating MD.  Her monitor is for 14 days & her follow up is not till August.  Timing should be okay to still get this done before her follow up visit.   Daughter verbalized understanding.

## 2014-03-19 DIAGNOSIS — R002 Palpitations: Secondary | ICD-10-CM | POA: Diagnosis not present

## 2014-03-24 DIAGNOSIS — L259 Unspecified contact dermatitis, unspecified cause: Secondary | ICD-10-CM | POA: Diagnosis not present

## 2014-04-24 DIAGNOSIS — D500 Iron deficiency anemia secondary to blood loss (chronic): Secondary | ICD-10-CM | POA: Diagnosis not present

## 2014-04-24 DIAGNOSIS — D649 Anemia, unspecified: Secondary | ICD-10-CM | POA: Diagnosis not present

## 2014-04-24 DIAGNOSIS — I4891 Unspecified atrial fibrillation: Secondary | ICD-10-CM | POA: Diagnosis present

## 2014-04-24 DIAGNOSIS — I079 Rheumatic tricuspid valve disease, unspecified: Secondary | ICD-10-CM | POA: Diagnosis not present

## 2014-04-24 DIAGNOSIS — I10 Essential (primary) hypertension: Secondary | ICD-10-CM | POA: Diagnosis not present

## 2014-04-24 DIAGNOSIS — Z853 Personal history of malignant neoplasm of breast: Secondary | ICD-10-CM | POA: Diagnosis not present

## 2014-04-24 DIAGNOSIS — F0150 Vascular dementia without behavioral disturbance: Secondary | ICD-10-CM | POA: Diagnosis present

## 2014-04-24 DIAGNOSIS — G40909 Epilepsy, unspecified, not intractable, without status epilepticus: Secondary | ICD-10-CM | POA: Diagnosis present

## 2014-04-24 DIAGNOSIS — Z882 Allergy status to sulfonamides status: Secondary | ICD-10-CM | POA: Diagnosis not present

## 2014-04-24 DIAGNOSIS — I517 Cardiomegaly: Secondary | ICD-10-CM | POA: Diagnosis not present

## 2014-04-24 DIAGNOSIS — E209 Hypoparathyroidism, unspecified: Secondary | ICD-10-CM | POA: Diagnosis present

## 2014-04-24 DIAGNOSIS — K922 Gastrointestinal hemorrhage, unspecified: Secondary | ICD-10-CM | POA: Diagnosis not present

## 2014-04-24 DIAGNOSIS — Z79899 Other long term (current) drug therapy: Secondary | ICD-10-CM | POA: Diagnosis not present

## 2014-04-24 DIAGNOSIS — I672 Cerebral atherosclerosis: Secondary | ICD-10-CM | POA: Diagnosis present

## 2014-04-24 DIAGNOSIS — E039 Hypothyroidism, unspecified: Secondary | ICD-10-CM | POA: Diagnosis not present

## 2014-04-24 DIAGNOSIS — I959 Hypotension, unspecified: Secondary | ICD-10-CM | POA: Diagnosis not present

## 2014-04-24 DIAGNOSIS — A419 Sepsis, unspecified organism: Secondary | ICD-10-CM | POA: Diagnosis not present

## 2014-04-24 DIAGNOSIS — Z888 Allergy status to other drugs, medicaments and biological substances status: Secondary | ICD-10-CM | POA: Diagnosis not present

## 2014-04-24 DIAGNOSIS — E861 Hypovolemia: Secondary | ICD-10-CM | POA: Diagnosis not present

## 2014-04-24 DIAGNOSIS — Z85850 Personal history of malignant neoplasm of thyroid: Secondary | ICD-10-CM | POA: Diagnosis not present

## 2014-04-24 DIAGNOSIS — S298XXA Other specified injuries of thorax, initial encounter: Secondary | ICD-10-CM | POA: Diagnosis not present

## 2014-04-24 DIAGNOSIS — E871 Hypo-osmolality and hyponatremia: Secondary | ICD-10-CM | POA: Diagnosis not present

## 2014-04-24 DIAGNOSIS — Z7982 Long term (current) use of aspirin: Secondary | ICD-10-CM | POA: Diagnosis not present

## 2014-05-01 ENCOUNTER — Other Ambulatory Visit: Payer: Self-pay | Admitting: Physician Assistant

## 2014-05-01 DIAGNOSIS — K279 Peptic ulcer, site unspecified, unspecified as acute or chronic, without hemorrhage or perforation: Secondary | ICD-10-CM | POA: Diagnosis not present

## 2014-05-01 DIAGNOSIS — Z6834 Body mass index (BMI) 34.0-34.9, adult: Secondary | ICD-10-CM | POA: Diagnosis not present

## 2014-05-01 DIAGNOSIS — Z136 Encounter for screening for cardiovascular disorders: Secondary | ICD-10-CM | POA: Diagnosis not present

## 2014-05-01 DIAGNOSIS — Z885 Allergy status to narcotic agent status: Secondary | ICD-10-CM | POA: Diagnosis not present

## 2014-05-01 DIAGNOSIS — E875 Hyperkalemia: Secondary | ICD-10-CM | POA: Diagnosis not present

## 2014-05-01 DIAGNOSIS — Z882 Allergy status to sulfonamides status: Secondary | ICD-10-CM | POA: Diagnosis not present

## 2014-05-01 DIAGNOSIS — Z853 Personal history of malignant neoplasm of breast: Secondary | ICD-10-CM | POA: Diagnosis not present

## 2014-05-01 DIAGNOSIS — E871 Hypo-osmolality and hyponatremia: Secondary | ICD-10-CM | POA: Diagnosis not present

## 2014-05-01 DIAGNOSIS — G4700 Insomnia, unspecified: Secondary | ICD-10-CM | POA: Diagnosis present

## 2014-05-01 DIAGNOSIS — Z8711 Personal history of peptic ulcer disease: Secondary | ICD-10-CM | POA: Diagnosis not present

## 2014-05-01 DIAGNOSIS — F411 Generalized anxiety disorder: Secondary | ICD-10-CM | POA: Diagnosis present

## 2014-05-01 DIAGNOSIS — G40909 Epilepsy, unspecified, not intractable, without status epilepticus: Secondary | ICD-10-CM | POA: Diagnosis not present

## 2014-05-01 DIAGNOSIS — J189 Pneumonia, unspecified organism: Secondary | ICD-10-CM | POA: Diagnosis not present

## 2014-05-01 DIAGNOSIS — I999 Unspecified disorder of circulatory system: Secondary | ICD-10-CM | POA: Diagnosis not present

## 2014-05-01 DIAGNOSIS — F6812 Factitious disorder with predominantly physical signs and symptoms: Secondary | ICD-10-CM | POA: Diagnosis not present

## 2014-05-01 DIAGNOSIS — I2589 Other forms of chronic ischemic heart disease: Secondary | ICD-10-CM | POA: Diagnosis present

## 2014-05-01 DIAGNOSIS — Z452 Encounter for adjustment and management of vascular access device: Secondary | ICD-10-CM | POA: Diagnosis not present

## 2014-05-01 DIAGNOSIS — R030 Elevated blood-pressure reading, without diagnosis of hypertension: Secondary | ICD-10-CM | POA: Diagnosis not present

## 2014-05-01 DIAGNOSIS — Z85850 Personal history of malignant neoplasm of thyroid: Secondary | ICD-10-CM | POA: Diagnosis not present

## 2014-05-01 DIAGNOSIS — J988 Other specified respiratory disorders: Secondary | ICD-10-CM | POA: Diagnosis not present

## 2014-05-01 DIAGNOSIS — F0390 Unspecified dementia without behavioral disturbance: Secondary | ICD-10-CM | POA: Diagnosis not present

## 2014-05-01 DIAGNOSIS — K219 Gastro-esophageal reflux disease without esophagitis: Secondary | ICD-10-CM | POA: Diagnosis not present

## 2014-05-01 DIAGNOSIS — I2789 Other specified pulmonary heart diseases: Secondary | ICD-10-CM | POA: Diagnosis present

## 2014-05-01 DIAGNOSIS — E209 Hypoparathyroidism, unspecified: Secondary | ICD-10-CM | POA: Diagnosis not present

## 2014-05-01 DIAGNOSIS — J90 Pleural effusion, not elsewhere classified: Secondary | ICD-10-CM | POA: Diagnosis not present

## 2014-05-01 DIAGNOSIS — I4891 Unspecified atrial fibrillation: Secondary | ICD-10-CM | POA: Diagnosis not present

## 2014-05-01 DIAGNOSIS — R0602 Shortness of breath: Secondary | ICD-10-CM | POA: Diagnosis not present

## 2014-05-01 DIAGNOSIS — Q279 Congenital malformation of peripheral vascular system, unspecified: Secondary | ICD-10-CM | POA: Diagnosis not present

## 2014-05-01 DIAGNOSIS — I059 Rheumatic mitral valve disease, unspecified: Secondary | ICD-10-CM | POA: Diagnosis present

## 2014-05-01 DIAGNOSIS — D509 Iron deficiency anemia, unspecified: Secondary | ICD-10-CM | POA: Diagnosis not present

## 2014-05-01 DIAGNOSIS — I959 Hypotension, unspecified: Secondary | ICD-10-CM | POA: Diagnosis not present

## 2014-05-01 DIAGNOSIS — D649 Anemia, unspecified: Secondary | ICD-10-CM | POA: Diagnosis not present

## 2014-05-01 DIAGNOSIS — I998 Other disorder of circulatory system: Secondary | ICD-10-CM | POA: Diagnosis not present

## 2014-05-01 DIAGNOSIS — J15212 Pneumonia due to Methicillin resistant Staphylococcus aureus: Secondary | ICD-10-CM | POA: Diagnosis not present

## 2014-05-01 DIAGNOSIS — Z886 Allergy status to analgesic agent status: Secondary | ICD-10-CM | POA: Diagnosis not present

## 2014-05-01 DIAGNOSIS — E876 Hypokalemia: Secondary | ICD-10-CM | POA: Diagnosis not present

## 2014-05-01 DIAGNOSIS — Z883 Allergy status to other anti-infective agents status: Secondary | ICD-10-CM | POA: Diagnosis not present

## 2014-05-01 DIAGNOSIS — Z888 Allergy status to other drugs, medicaments and biological substances status: Secondary | ICD-10-CM | POA: Diagnosis not present

## 2014-05-01 DIAGNOSIS — J984 Other disorders of lung: Secondary | ICD-10-CM | POA: Diagnosis not present

## 2014-05-08 DIAGNOSIS — Z883 Allergy status to other anti-infective agents status: Secondary | ICD-10-CM | POA: Diagnosis not present

## 2014-05-08 DIAGNOSIS — D509 Iron deficiency anemia, unspecified: Secondary | ICD-10-CM | POA: Diagnosis not present

## 2014-05-08 DIAGNOSIS — G4700 Insomnia, unspecified: Secondary | ICD-10-CM | POA: Diagnosis present

## 2014-05-08 DIAGNOSIS — G8929 Other chronic pain: Secondary | ICD-10-CM | POA: Diagnosis present

## 2014-05-08 DIAGNOSIS — M545 Low back pain, unspecified: Secondary | ICD-10-CM | POA: Diagnosis present

## 2014-05-08 DIAGNOSIS — G40309 Generalized idiopathic epilepsy and epileptic syndromes, not intractable, without status epilepticus: Secondary | ICD-10-CM | POA: Diagnosis present

## 2014-05-08 DIAGNOSIS — E876 Hypokalemia: Secondary | ICD-10-CM | POA: Diagnosis present

## 2014-05-08 DIAGNOSIS — E871 Hypo-osmolality and hyponatremia: Secondary | ICD-10-CM | POA: Diagnosis not present

## 2014-05-08 DIAGNOSIS — G2581 Restless legs syndrome: Secondary | ICD-10-CM | POA: Diagnosis present

## 2014-05-08 DIAGNOSIS — R293 Abnormal posture: Secondary | ICD-10-CM | POA: Diagnosis not present

## 2014-05-08 DIAGNOSIS — Z5189 Encounter for other specified aftercare: Secondary | ICD-10-CM | POA: Diagnosis not present

## 2014-05-08 DIAGNOSIS — R079 Chest pain, unspecified: Secondary | ICD-10-CM | POA: Diagnosis not present

## 2014-05-08 DIAGNOSIS — I2589 Other forms of chronic ischemic heart disease: Secondary | ICD-10-CM | POA: Diagnosis not present

## 2014-05-08 DIAGNOSIS — E209 Hypoparathyroidism, unspecified: Secondary | ICD-10-CM | POA: Diagnosis present

## 2014-05-08 DIAGNOSIS — R279 Unspecified lack of coordination: Secondary | ICD-10-CM | POA: Diagnosis not present

## 2014-05-08 DIAGNOSIS — Z885 Allergy status to narcotic agent status: Secondary | ICD-10-CM | POA: Diagnosis not present

## 2014-05-08 DIAGNOSIS — Z7982 Long term (current) use of aspirin: Secondary | ICD-10-CM | POA: Diagnosis not present

## 2014-05-08 DIAGNOSIS — M6281 Muscle weakness (generalized): Secondary | ICD-10-CM | POA: Diagnosis not present

## 2014-05-08 DIAGNOSIS — R269 Unspecified abnormalities of gait and mobility: Secondary | ICD-10-CM | POA: Diagnosis not present

## 2014-05-08 DIAGNOSIS — J15212 Pneumonia due to Methicillin resistant Staphylococcus aureus: Secondary | ICD-10-CM | POA: Diagnosis not present

## 2014-05-08 DIAGNOSIS — I4891 Unspecified atrial fibrillation: Secondary | ICD-10-CM | POA: Diagnosis not present

## 2014-05-08 DIAGNOSIS — Z79899 Other long term (current) drug therapy: Secondary | ICD-10-CM | POA: Diagnosis not present

## 2014-05-08 DIAGNOSIS — J189 Pneumonia, unspecified organism: Secondary | ICD-10-CM | POA: Diagnosis not present

## 2014-05-08 DIAGNOSIS — F329 Major depressive disorder, single episode, unspecified: Secondary | ICD-10-CM | POA: Diagnosis present

## 2014-05-08 DIAGNOSIS — Z882 Allergy status to sulfonamides status: Secondary | ICD-10-CM | POA: Diagnosis not present

## 2014-05-08 DIAGNOSIS — K219 Gastro-esophageal reflux disease without esophagitis: Secondary | ICD-10-CM | POA: Diagnosis present

## 2014-05-08 DIAGNOSIS — E039 Hypothyroidism, unspecified: Secondary | ICD-10-CM | POA: Diagnosis present

## 2014-05-08 DIAGNOSIS — D649 Anemia, unspecified: Secondary | ICD-10-CM | POA: Diagnosis not present

## 2014-05-08 DIAGNOSIS — F3289 Other specified depressive episodes: Secondary | ICD-10-CM | POA: Diagnosis present

## 2014-05-08 DIAGNOSIS — I509 Heart failure, unspecified: Secondary | ICD-10-CM | POA: Diagnosis present

## 2014-05-08 DIAGNOSIS — I5033 Acute on chronic diastolic (congestive) heart failure: Secondary | ICD-10-CM | POA: Diagnosis present

## 2014-05-08 DIAGNOSIS — F411 Generalized anxiety disorder: Secondary | ICD-10-CM | POA: Diagnosis present

## 2014-05-08 DIAGNOSIS — Z888 Allergy status to other drugs, medicaments and biological substances status: Secondary | ICD-10-CM | POA: Diagnosis not present

## 2014-05-08 DIAGNOSIS — E236 Other disorders of pituitary gland: Secondary | ICD-10-CM | POA: Diagnosis present

## 2014-05-12 ENCOUNTER — Telehealth: Payer: Self-pay | Admitting: Cardiology

## 2014-05-12 NOTE — Telephone Encounter (Signed)
Mrs. Karg daughter Anderson Malta called to cancel upcoming appointment with Dr. Harl Bowie. States that she is at the [facility] at this time. Will call The office back when she is better.

## 2014-05-14 DIAGNOSIS — Z79899 Other long term (current) drug therapy: Secondary | ICD-10-CM | POA: Diagnosis not present

## 2014-05-14 DIAGNOSIS — F411 Generalized anxiety disorder: Secondary | ICD-10-CM | POA: Diagnosis present

## 2014-05-14 DIAGNOSIS — E236 Other disorders of pituitary gland: Secondary | ICD-10-CM | POA: Diagnosis not present

## 2014-05-14 DIAGNOSIS — K219 Gastro-esophageal reflux disease without esophagitis: Secondary | ICD-10-CM | POA: Diagnosis present

## 2014-05-14 DIAGNOSIS — E039 Hypothyroidism, unspecified: Secondary | ICD-10-CM | POA: Diagnosis present

## 2014-05-14 DIAGNOSIS — R079 Chest pain, unspecified: Secondary | ICD-10-CM | POA: Diagnosis not present

## 2014-05-14 DIAGNOSIS — Z882 Allergy status to sulfonamides status: Secondary | ICD-10-CM | POA: Diagnosis not present

## 2014-05-14 DIAGNOSIS — G2581 Restless legs syndrome: Secondary | ICD-10-CM | POA: Diagnosis present

## 2014-05-14 DIAGNOSIS — I5033 Acute on chronic diastolic (congestive) heart failure: Secondary | ICD-10-CM | POA: Diagnosis not present

## 2014-05-14 DIAGNOSIS — M545 Low back pain, unspecified: Secondary | ICD-10-CM | POA: Diagnosis present

## 2014-05-14 DIAGNOSIS — M6281 Muscle weakness (generalized): Secondary | ICD-10-CM | POA: Diagnosis not present

## 2014-05-14 DIAGNOSIS — F3289 Other specified depressive episodes: Secondary | ICD-10-CM | POA: Diagnosis present

## 2014-05-14 DIAGNOSIS — G4700 Insomnia, unspecified: Secondary | ICD-10-CM | POA: Diagnosis present

## 2014-05-14 DIAGNOSIS — R269 Unspecified abnormalities of gait and mobility: Secondary | ICD-10-CM | POA: Diagnosis not present

## 2014-05-14 DIAGNOSIS — G40309 Generalized idiopathic epilepsy and epileptic syndromes, not intractable, without status epilepticus: Secondary | ICD-10-CM | POA: Diagnosis present

## 2014-05-14 DIAGNOSIS — D509 Iron deficiency anemia, unspecified: Secondary | ICD-10-CM | POA: Diagnosis not present

## 2014-05-14 DIAGNOSIS — Z883 Allergy status to other anti-infective agents status: Secondary | ICD-10-CM | POA: Diagnosis not present

## 2014-05-14 DIAGNOSIS — I509 Heart failure, unspecified: Secondary | ICD-10-CM | POA: Diagnosis not present

## 2014-05-14 DIAGNOSIS — I2589 Other forms of chronic ischemic heart disease: Secondary | ICD-10-CM | POA: Diagnosis not present

## 2014-05-14 DIAGNOSIS — I10 Essential (primary) hypertension: Secondary | ICD-10-CM | POA: Diagnosis not present

## 2014-05-14 DIAGNOSIS — Z888 Allergy status to other drugs, medicaments and biological substances status: Secondary | ICD-10-CM | POA: Diagnosis not present

## 2014-05-14 DIAGNOSIS — J15212 Pneumonia due to Methicillin resistant Staphylococcus aureus: Secondary | ICD-10-CM | POA: Diagnosis not present

## 2014-05-14 DIAGNOSIS — G8929 Other chronic pain: Secondary | ICD-10-CM | POA: Diagnosis present

## 2014-05-14 DIAGNOSIS — R293 Abnormal posture: Secondary | ICD-10-CM | POA: Diagnosis not present

## 2014-05-14 DIAGNOSIS — I5032 Chronic diastolic (congestive) heart failure: Secondary | ICD-10-CM | POA: Diagnosis not present

## 2014-05-14 DIAGNOSIS — Z5189 Encounter for other specified aftercare: Secondary | ICD-10-CM | POA: Diagnosis not present

## 2014-05-14 DIAGNOSIS — Z7982 Long term (current) use of aspirin: Secondary | ICD-10-CM | POA: Diagnosis not present

## 2014-05-14 DIAGNOSIS — E876 Hypokalemia: Secondary | ICD-10-CM | POA: Diagnosis present

## 2014-05-14 DIAGNOSIS — E871 Hypo-osmolality and hyponatremia: Secondary | ICD-10-CM | POA: Diagnosis not present

## 2014-05-14 DIAGNOSIS — I4891 Unspecified atrial fibrillation: Secondary | ICD-10-CM | POA: Diagnosis not present

## 2014-05-14 DIAGNOSIS — Z885 Allergy status to narcotic agent status: Secondary | ICD-10-CM | POA: Diagnosis not present

## 2014-05-14 DIAGNOSIS — J189 Pneumonia, unspecified organism: Secondary | ICD-10-CM | POA: Diagnosis not present

## 2014-05-14 DIAGNOSIS — F329 Major depressive disorder, single episode, unspecified: Secondary | ICD-10-CM | POA: Diagnosis present

## 2014-05-14 DIAGNOSIS — E209 Hypoparathyroidism, unspecified: Secondary | ICD-10-CM | POA: Diagnosis not present

## 2014-05-14 DIAGNOSIS — R279 Unspecified lack of coordination: Secondary | ICD-10-CM | POA: Diagnosis not present

## 2014-05-15 ENCOUNTER — Ambulatory Visit: Payer: Medicare Other | Admitting: Cardiology

## 2014-05-16 DIAGNOSIS — I2589 Other forms of chronic ischemic heart disease: Secondary | ICD-10-CM | POA: Diagnosis not present

## 2014-05-16 DIAGNOSIS — R279 Unspecified lack of coordination: Secondary | ICD-10-CM | POA: Diagnosis not present

## 2014-05-16 DIAGNOSIS — I5032 Chronic diastolic (congestive) heart failure: Secondary | ICD-10-CM | POA: Diagnosis not present

## 2014-05-16 DIAGNOSIS — M545 Low back pain, unspecified: Secondary | ICD-10-CM | POA: Diagnosis not present

## 2014-05-16 DIAGNOSIS — I4891 Unspecified atrial fibrillation: Secondary | ICD-10-CM | POA: Diagnosis not present

## 2014-05-16 DIAGNOSIS — R269 Unspecified abnormalities of gait and mobility: Secondary | ICD-10-CM | POA: Diagnosis not present

## 2014-05-16 DIAGNOSIS — G8929 Other chronic pain: Secondary | ICD-10-CM | POA: Diagnosis not present

## 2014-05-16 DIAGNOSIS — R079 Chest pain, unspecified: Secondary | ICD-10-CM | POA: Diagnosis not present

## 2014-05-16 DIAGNOSIS — I509 Heart failure, unspecified: Secondary | ICD-10-CM | POA: Diagnosis not present

## 2014-05-16 DIAGNOSIS — Z5189 Encounter for other specified aftercare: Secondary | ICD-10-CM | POA: Diagnosis not present

## 2014-05-16 DIAGNOSIS — G40309 Generalized idiopathic epilepsy and epileptic syndromes, not intractable, without status epilepticus: Secondary | ICD-10-CM | POA: Diagnosis not present

## 2014-05-16 DIAGNOSIS — R293 Abnormal posture: Secondary | ICD-10-CM | POA: Diagnosis not present

## 2014-05-16 DIAGNOSIS — K219 Gastro-esophageal reflux disease without esophagitis: Secondary | ICD-10-CM | POA: Diagnosis not present

## 2014-05-16 DIAGNOSIS — I10 Essential (primary) hypertension: Secondary | ICD-10-CM | POA: Diagnosis not present

## 2014-05-16 DIAGNOSIS — M6281 Muscle weakness (generalized): Secondary | ICD-10-CM | POA: Diagnosis not present

## 2014-05-16 DIAGNOSIS — E876 Hypokalemia: Secondary | ICD-10-CM | POA: Diagnosis not present

## 2014-05-30 DIAGNOSIS — I4891 Unspecified atrial fibrillation: Secondary | ICD-10-CM | POA: Diagnosis not present

## 2014-06-02 DIAGNOSIS — I959 Hypotension, unspecified: Secondary | ICD-10-CM | POA: Diagnosis not present

## 2014-06-02 DIAGNOSIS — I4891 Unspecified atrial fibrillation: Secondary | ICD-10-CM | POA: Diagnosis not present

## 2014-06-02 DIAGNOSIS — K279 Peptic ulcer, site unspecified, unspecified as acute or chronic, without hemorrhage or perforation: Secondary | ICD-10-CM | POA: Diagnosis not present

## 2014-06-02 DIAGNOSIS — D649 Anemia, unspecified: Secondary | ICD-10-CM | POA: Diagnosis not present

## 2014-06-06 ENCOUNTER — Emergency Department (HOSPITAL_COMMUNITY): Payer: Medicare Other

## 2014-06-06 ENCOUNTER — Encounter (HOSPITAL_COMMUNITY): Payer: Self-pay | Admitting: Emergency Medicine

## 2014-06-06 ENCOUNTER — Inpatient Hospital Stay (HOSPITAL_COMMUNITY)
Admission: EM | Admit: 2014-06-06 | Discharge: 2014-06-09 | DRG: 310 | Disposition: A | Discharge status: Home / Self Care | Payer: Medicare Other | Attending: Internal Medicine | Admitting: Internal Medicine

## 2014-06-06 DIAGNOSIS — I10 Essential (primary) hypertension: Secondary | ICD-10-CM | POA: Diagnosis present

## 2014-06-06 DIAGNOSIS — K219 Gastro-esophageal reflux disease without esophagitis: Secondary | ICD-10-CM | POA: Diagnosis present

## 2014-06-06 DIAGNOSIS — F0150 Vascular dementia without behavioral disturbance: Secondary | ICD-10-CM | POA: Diagnosis present

## 2014-06-06 DIAGNOSIS — E209 Hypoparathyroidism, unspecified: Secondary | ICD-10-CM | POA: Diagnosis present

## 2014-06-06 DIAGNOSIS — Z853 Personal history of malignant neoplasm of breast: Secondary | ICD-10-CM

## 2014-06-06 DIAGNOSIS — E890 Postprocedural hypothyroidism: Secondary | ICD-10-CM | POA: Diagnosis present

## 2014-06-06 DIAGNOSIS — Z8542 Personal history of malignant neoplasm of other parts of uterus: Secondary | ICD-10-CM | POA: Diagnosis not present

## 2014-06-06 DIAGNOSIS — R079 Chest pain, unspecified: Secondary | ICD-10-CM | POA: Diagnosis present

## 2014-06-06 DIAGNOSIS — E785 Hyperlipidemia, unspecified: Secondary | ICD-10-CM | POA: Diagnosis present

## 2014-06-06 DIAGNOSIS — R0789 Other chest pain: Secondary | ICD-10-CM | POA: Diagnosis not present

## 2014-06-06 DIAGNOSIS — E892 Postprocedural hypoparathyroidism: Secondary | ICD-10-CM

## 2014-06-06 DIAGNOSIS — J449 Chronic obstructive pulmonary disease, unspecified: Secondary | ICD-10-CM | POA: Diagnosis not present

## 2014-06-06 DIAGNOSIS — G40909 Epilepsy, unspecified, not intractable, without status epilepticus: Secondary | ICD-10-CM | POA: Diagnosis present

## 2014-06-06 DIAGNOSIS — I4891 Unspecified atrial fibrillation: Secondary | ICD-10-CM | POA: Diagnosis present

## 2014-06-06 DIAGNOSIS — Z85850 Personal history of malignant neoplasm of thyroid: Secondary | ICD-10-CM | POA: Diagnosis not present

## 2014-06-06 DIAGNOSIS — I340 Nonrheumatic mitral (valve) insufficiency: Secondary | ICD-10-CM | POA: Diagnosis present

## 2014-06-06 DIAGNOSIS — I456 Pre-excitation syndrome: Secondary | ICD-10-CM | POA: Diagnosis present

## 2014-06-06 DIAGNOSIS — J9819 Other pulmonary collapse: Secondary | ICD-10-CM | POA: Diagnosis not present

## 2014-06-06 HISTORY — DX: Malignant neoplasm of uterus, part unspecified: C55

## 2014-06-06 HISTORY — DX: Unspecified atrial fibrillation: I48.91

## 2014-06-06 HISTORY — DX: Malignant neoplasm of thyroid gland: C73

## 2014-06-06 HISTORY — DX: Malignant neoplasm of unspecified site of unspecified female breast: C50.919

## 2014-06-06 LAB — BASIC METABOLIC PANEL
Anion gap: 15 (ref 5–15)
BUN: 16 mg/dL (ref 6–23)
CHLORIDE: 97 meq/L (ref 96–112)
CO2: 23 mEq/L (ref 19–32)
Calcium: 7.6 mg/dL — ABNORMAL LOW (ref 8.4–10.5)
Creatinine, Ser: 0.68 mg/dL (ref 0.50–1.10)
GFR calc non Af Amer: 87 mL/min — ABNORMAL LOW (ref >90)
Glucose, Bld: 102 mg/dL — ABNORMAL HIGH (ref 70–99)
Potassium: 4.3 mEq/L (ref 3.7–5.3)
Sodium: 135 mEq/L — ABNORMAL LOW (ref 137–147)

## 2014-06-06 LAB — CBC WITH DIFFERENTIAL/PLATELET
BASOS ABS: 0 10*3/uL (ref 0.0–0.1)
BASOS PCT: 0 % (ref 0–1)
EOS ABS: 0.1 10*3/uL (ref 0.0–0.7)
Eosinophils Relative: 3 % (ref 0–5)
HCT: 33 % — ABNORMAL LOW (ref 36.0–46.0)
Hemoglobin: 10.9 g/dL — ABNORMAL LOW (ref 12.0–15.0)
Lymphocytes Relative: 33 % (ref 12–46)
Lymphs Abs: 1.5 10*3/uL (ref 0.7–4.0)
MCH: 26.5 pg (ref 26.0–34.0)
MCHC: 33 g/dL (ref 30.0–36.0)
MCV: 80.3 fL (ref 78.0–100.0)
MONOS PCT: 9 % (ref 3–12)
Monocytes Absolute: 0.4 10*3/uL (ref 0.1–1.0)
NEUTROS PCT: 54 % (ref 43–77)
Neutro Abs: 2.5 10*3/uL (ref 1.7–7.7)
Platelets: 161 10*3/uL (ref 150–400)
RBC: 4.11 MIL/uL (ref 3.87–5.11)
WBC: 4.6 10*3/uL (ref 4.0–10.5)

## 2014-06-06 LAB — TROPONIN I
Troponin I: <0.3 ng/mL (ref <0.30)
Troponin I: <0.3 ng/mL (ref <0.30)

## 2014-06-06 MED ORDER — ALUM & MAG HYDROXIDE-SIMETH 200-200-20 MG/5ML PO SUSP
30.0000 mL | Freq: Four times a day (QID) | ORAL | Status: DC | PRN
Start: 2014-06-06 — End: 2014-06-09
  Administered 2014-06-07: 30 mL via ORAL
  Filled 2014-06-06 (×2): qty 30

## 2014-06-06 MED ORDER — DIVALPROEX SODIUM ER 500 MG PO TB24
500.0000 mg | ORAL_TABLET | Freq: Every day | ORAL | Status: DC
  Administered 2014-06-06 – 2014-06-08 (×3): 500 mg via ORAL
  Filled 2014-06-06 (×3): qty 1

## 2014-06-06 MED ORDER — DILTIAZEM HCL ER BEADS 240 MG PO CP24: 360.0000 mg | ORAL_CAPSULE | Freq: Every day | ORAL | Status: DC

## 2014-06-06 MED ORDER — DILTIAZEM HCL ER COATED BEADS 180 MG PO CP24
360.0000 mg | ORAL_CAPSULE | Freq: Every day | ORAL | Status: DC
  Administered 2014-06-07 – 2014-06-09 (×3): 360 mg via ORAL
  Filled 2014-06-06 (×4): qty 2

## 2014-06-06 MED ORDER — METOPROLOL SUCCINATE ER 50 MG PO TB24
100.0000 mg | ORAL_TABLET | Freq: Every day | ORAL | Status: DC
  Administered 2014-06-07: 100 mg via ORAL
  Filled 2014-06-06 (×2): qty 2

## 2014-06-06 MED ORDER — DULOXETINE HCL 60 MG PO CPEP
60.0000 mg | ORAL_CAPSULE | Freq: Two times a day (BID) | ORAL | Status: DC
  Administered 2014-06-06 – 2014-06-09 (×6): 60 mg via ORAL
  Filled 2014-06-06 (×6): qty 1

## 2014-06-06 MED ORDER — ACETAMINOPHEN 325 MG PO TABS
650.0000 mg | ORAL_TABLET | Freq: Four times a day (QID) | ORAL | Status: DC | PRN
  Filled 2014-06-06 (×2): qty 2

## 2014-06-06 MED ORDER — BUTALBITAL-APAP-CAFFEINE 50-325-40 MG PO TABS
1.0000 | ORAL_TABLET | ORAL | Status: DC | PRN
Start: 2014-06-06 — End: 2014-06-09
  Administered 2014-06-06 – 2014-06-09 (×8): 1 via ORAL
  Filled 2014-06-06 (×8): qty 1

## 2014-06-06 MED ORDER — SODIUM CHLORIDE 0.9 % IJ SOLN
3.0000 mL | Freq: Two times a day (BID) | INTRAMUSCULAR | Status: DC
  Administered 2014-06-06 – 2014-06-08 (×4): 3 mL via INTRAVENOUS

## 2014-06-06 MED ORDER — ENOXAPARIN SODIUM 40 MG/0.4ML ~~LOC~~ SOLN
40.0000 mg | SUBCUTANEOUS | Status: DC
  Administered 2014-06-07 – 2014-06-08 (×2): 40 mg via SUBCUTANEOUS
  Filled 2014-06-06 (×3): qty 0.4

## 2014-06-06 MED ORDER — MORPHINE SULFATE 2 MG/ML IJ SOLN: 2.0000 mg | INTRAMUSCULAR | Status: DC | PRN

## 2014-06-06 MED ORDER — ACETAMINOPHEN 650 MG RE SUPP: 650.0000 mg | Freq: Four times a day (QID) | RECTAL | Status: DC | PRN

## 2014-06-06 MED ORDER — ROPINIROLE HCL 1 MG PO TABS
2.0000 mg | ORAL_TABLET | Freq: Every day | ORAL | Status: DC
  Administered 2014-06-06 – 2014-06-08 (×3): 2 mg via ORAL
  Filled 2014-06-06 (×4): qty 2

## 2014-06-06 MED ORDER — PANTOPRAZOLE SODIUM 40 MG PO TBEC
40.0000 mg | DELAYED_RELEASE_TABLET | Freq: Every day | ORAL | Status: DC
  Administered 2014-06-07 – 2014-06-09 (×3): 40 mg via ORAL
  Filled 2014-06-06 (×3): qty 1

## 2014-06-06 MED ORDER — POTASSIUM CHLORIDE CRYS ER 20 MEQ PO TBCR
40.0000 meq | EXTENDED_RELEASE_TABLET | Freq: Two times a day (BID) | ORAL | Status: DC
  Administered 2014-06-06 – 2014-06-09 (×6): 40 meq via ORAL
  Filled 2014-06-06 (×6): qty 2

## 2014-06-06 MED ORDER — ROPINIROLE HCL 1 MG PO TABS
ORAL_TABLET | ORAL | Status: AC
  Filled 2014-06-06: qty 2

## 2014-06-06 MED ORDER — DOCUSATE SODIUM 100 MG PO CAPS: 100.0000 mg | ORAL_CAPSULE | Freq: Every day | ORAL | Status: DC | PRN

## 2014-06-06 MED ORDER — LORAZEPAM 2 MG/ML IJ SOLN
0.5000 mg | Freq: Four times a day (QID) | INTRAMUSCULAR | Status: DC | PRN
  Administered 2014-06-06 – 2014-06-08 (×3): 0.5 mg via INTRAVENOUS
  Filled 2014-06-06 (×3): qty 1

## 2014-06-06 MED ORDER — NITROGLYCERIN 0.4 MG SL SUBL: 0.4000 mg | SUBLINGUAL_TABLET | SUBLINGUAL | Status: DC | PRN

## 2014-06-06 MED ORDER — TIZANIDINE HCL 4 MG PO TABS
4.0000 mg | ORAL_TABLET | Freq: Four times a day (QID) | ORAL | Status: DC | PRN
  Administered 2014-06-06 – 2014-06-09 (×4): 4 mg via ORAL
  Filled 2014-06-06 (×4): qty 1

## 2014-06-06 MED ORDER — LEVOTHYROXINE SODIUM 75 MCG PO TABS
150.0000 ug | ORAL_TABLET | Freq: Every day | ORAL | Status: DC
  Administered 2014-06-07 – 2014-06-09 (×3): 150 ug via ORAL
  Filled 2014-06-06 (×3): qty 2

## 2014-06-06 NOTE — ED Provider Notes (Signed)
CSN: [id number]     Arrival date & time 06/06/14  1410 History   First MD Initiated Contact with Patient 06/06/14 1428     Chief Complaint  Patient presents with  . Chest Pain     HPI Pt was seen at 1445.  Per EMS and pt report, c/o gradual onset and resolution of one episode of chest discomfort that occurred today approximately 1300 PTA. Pt states she had finished eating and walked back to her room when she developed her symptoms. Symptoms included "heavy" left sided chest pain which radiated to her jaw and left arm, diaphoresis, SOB, and nausea. Pt describes her symptoms as "my heart pain." Pt states she took her own SL ntg x2 with complete resolution of her symptoms. EMS gave ASA while en route to the ED. Pt continues symptom-free on arrival to the ED. Denies palpations, no cough, no back pain, no abd pain, no vomiting/diarrhea.    Past Medical History  Diagnosis Date  . Vascular dementia, uncomplicated   . Unspecified epilepsy without mention of intractable epilepsy   . Tachycardia, unspecified   . Hypoparathyroidism   . Malignant neoplasm of thyroid gland   . Personal history of malignant neoplasm of breast   . Unspecified hypothyroidism   . Esophageal reflux   . Unspecified urinary incontinence   . Insomnia, unspecified   . Migraine   . Anxiety state, unspecified   . Hypopotassemia   . Wolff-Parkinson-White (WPW) syndrome   . Mitral regurgitation     Moderate, echo, 01/2012  . HLD (hyperlipidemia)   . Uterine cancer   . Breast cancer   . Thyroid cancer   . Atrial fibrillation     no anticoagulation due to hx GI bleeding   Past Surgical History  Procedure Laterality Date  . Abdominal hysterectomy    . Neck surgery     Family History  Problem Relation Age of Onset  . Cancer Mother   . Hyperlipidemia Father   . Cancer Father    History  Substance Use Topics  . Smoking status: Never Smoker   . Smokeless tobacco: Never Used  . Alcohol Use: No    Review of  Systems ROS: Statement: All systems negative except as marked or noted in the HPI; Constitutional: Negative for fever and chills. ; ; Eyes: Negative for eye pain, redness and discharge. ; ; ENMT: Negative for ear pain, hoarseness, nasal congestion, sinus pressure and sore throat. ; ; Cardiovascular: +CP, SOB, diaphoresis. Negative for palpitations, and peripheral edema. ; ; Respiratory: Negative for cough, wheezing and stridor. ; ; Gastrointestinal: +nausea. Negative for vomiting, diarrhea, abdominal pain, blood in stool, hematemesis, jaundice and rectal bleeding. . ; ; Genitourinary: Negative for dysuria, flank pain and hematuria. ; ; Musculoskeletal: Negative for back pain and neck pain. Negative for swelling and trauma.; ; Skin: Negative for pruritus, rash, abrasions, blisters, bruising and skin lesion.; ; Neuro: Negative for headache, lightheadedness and neck stiffness. Negative for weakness, altered level of consciousness , altered mental status, extremity weakness, paresthesias, involuntary movement, seizure and syncope.      Allergies  Adhesive; Asa; Cyclobenzaprine; Imdur; Iodine; Pentazocine; Propoxyphene and methadone; Septra; Sulfa antibiotics; Tegretol; and Tetracyclines & related  Home Medications   Prior to Admission medications   Medication Sig Start Date End Date Taking? Authorizing Provider  acetaminophen (TYLENOL) 325 MG tablet Take 650 mg by mouth every 6 (six) hours as needed. pain   Yes Historical Provider, MD  calcitRIOL (ROCALTROL) 0.25 MCG capsule Take  0.25 mcg by mouth 2 (two) times daily.   Yes Historical Provider, MD  calcium carbonate (TUMS - DOSED IN MG ELEMENTAL CALCIUM) 500 MG chewable tablet Chew 1 tablet by mouth 3 (three) times daily.   Yes Historical Provider, MD  Cholecalciferol (VITAMIN D) 2000 UNITS CAPS Take 1 capsule by mouth daily.   Yes Historical Provider, MD  diltiazem (TIAZAC) 360 MG 24 hr capsule Take 360 mg by mouth daily.   Yes Historical Provider, MD   divalproex (DEPAKOTE ER) 500 MG 24 hr tablet Take 500 mg by mouth at bedtime.   Yes Historical Provider, MD  DULoxetine (CYMBALTA) 60 MG capsule Take 60 mg by mouth 2 (two) times daily.   Yes Historical Provider, MD  esomeprazole (NEXIUM) 40 MG capsule Take 40 mg by mouth daily.   Yes Historical Provider, MD  levothyroxine (SYNTHROID, LEVOTHROID) 150 MCG tablet Take 150 mcg by mouth daily before breakfast.   Yes Historical Provider, MD  metoprolol succinate (TOPROL-XL) 100 MG 24 hr tablet Take 100 mg by mouth daily. Take with or immediately following a meal.   Yes Historical Provider, MD  Multiple Vitamin (MULTIVITAMIN) tablet Take 1 tablet by mouth daily.   Yes Historical Provider, MD  nitroGLYCERIN (NITROSTAT) 0.4 MG SL tablet Place 0.4 mg under the tongue every 5 (five) minutes as needed for chest pain.   Yes Historical Provider, MD  potassium chloride SA (K-DUR,KLOR-CON) 20 MEQ tablet Take 40 mEq by mouth 2 (two) times daily.   Yes Historical Provider, MD  rOPINIRole (REQUIP) 2 MG tablet Take 2 mg by mouth at bedtime.    Yes Historical Provider, MD  tiZANidine (ZANAFLEX) 4 MG tablet Take 4 mg by mouth every 6 (six) hours as needed for muscle spasms.   Yes Historical Provider, MD  tiZANidine (ZANAFLEX) 4 MG tablet Take 4 mg by mouth every 6 (six) hours as needed for muscle spasms.     Historical Provider, MD   BP 120/62  Temp(Src) 98.5 F (36.9 C) (Oral)  Resp 32  Ht 5' 1.25" (1.556 m)  SpO2 96% Physical Exam 1450: Physical examination:  Nursing notes reviewed; Vital signs and O2 SAT reviewed;  Constitutional: Well developed, Well nourished, Well hydrated, In no acute distress; Head:  Normocephalic, atraumatic; Eyes: EOMI, PERRL, No scleral icterus; ENMT: Mouth and pharynx normal, Mucous membranes moist; Neck: Supple, Full range of motion, No lymphadenopathy; Cardiovascular: Irregular irregular rate and rhythm, No gallop; Respiratory: Breath sounds clear & equal bilaterally, No wheezes.   Speaking full sentences with ease, Normal respiratory effort/excursion; Chest: Nontender, Movement normal; Abdomen: Soft, Nontender, Nondistended, Normal bowel sounds; Genitourinary: No CVA tenderness; Extremities: Pulses normal, No tenderness, +1 pedal edema bilat without  calf asymmetry.; Neuro: AA&Ox3, Major CN grossly intact.  Speech clear. No gross focal motor or sensory deficits in extremities.; Skin: Color normal, Warm, Dry.   ED Course  Procedures     EKG Interpretation   Date/Time:  Saturday June 06 2014 14:19:58 EDT Ventricular Rate:  76 PR Interval:    QRS Duration: 92 QT Interval:  410 QTC Calculation: 461 R Axis:   84 Text Interpretation:  Atrial fibrillation Anteroseptal infarct, old When  compared with ECG of 09/26/2012 Atrial fibrillation has replaced Normal  sinus rhythm Confirmed by [facility]  MD, Nunzio Cory [id number]) on 06/06/2014  3:56:05 PM      MDM  MDM Reviewed: previous chart, nursing note and vitals Reviewed previous: labs and ECG Interpretation: labs, ECG and x-ray     Results for  orders placed during the hospital encounter of 06/06/14  CBC WITH DIFFERENTIAL      Result Value Ref Range   WBC 4.6  4.0 - 10.5 K/uL   RBC 4.11  3.87 - 5.11 MIL/uL   Hemoglobin 10.9 (*) 12.0 - 15.0 g/dL   HCT 33.0 (*) 36.0 - 46.0 %   MCV 80.3  78.0 - 100.0 fL   MCH 26.5  26.0 - 34.0 pg   MCHC 33.0  30.0 - 36.0 g/dL   Platelets 161  150 - 400 K/uL   Neutrophils Relative % 54  43 - 77 %   Neutro Abs 2.5  1.7 - 7.7 K/uL   Lymphocytes Relative 33  12 - 46 %   Lymphs Abs 1.5  0.7 - 4.0 K/uL   Monocytes Relative 9  3 - 12 %   Monocytes Absolute 0.4  0.1 - 1.0 K/uL   Eosinophils Relative 3  0 - 5 %   Eosinophils Absolute 0.1  0.0 - 0.7 K/uL   Basophils Relative 0  0 - 1 %   Basophils Absolute 0.0  0.0 - 0.1 K/uL   RBC Morphology MIXED RBC POPULATION     Smear Review LARGE PLATELETS PRESENT    TROPONIN I      Result Value Ref Range   Troponin I <0.30  <0.30 ng/mL   BASIC METABOLIC PANEL      Result Value Ref Range   Sodium 135 (*) 137 - 147 mEq/L   Potassium 4.3  3.7 - 5.3 mEq/L   Chloride 97  96 - 112 mEq/L   CO2 23  19 - 32 mEq/L   Glucose, Bld 102 (*) 70 - 99 mg/dL   BUN 16  6 - 23 mg/dL   Creatinine, Ser 0.68  0.50 - 1.10 mg/dL   Calcium 7.6 (*) 8.4 - 10.5 mg/dL   GFR calc non Af Amer 87 (*) >90 mL/min   GFR calc Af Amer >90  >90 mL/min   Anion gap 15  5 - 15   Dg Chest Port 1 View 06/06/2014   CLINICAL DATA:  Chest pain.  EXAM: PORTABLE CHEST - 1 VIEW  COMPARISON:  None.  FINDINGS: There is hyperinflation of the lungs compatible with COPD. Heart is borderline in size. Left central line tip is at the cavoatrial junction. Suspect hiatal hernia. Bibasilar densities likely reflect atelectasis. No effusions. No acute bony abnormality.  IMPRESSION: COPD.  Bibasilar atelectasis.   Electronically Signed   By: Rolm Baptise M.D.   On: 06/06/2014 14:53    1655:   Pt denies symptoms while in the ED. Pt has significant cardiac risk factors, will admit. Dx and testing d/w pt and family.  Questions answered.  Verb understanding, agreeable to admit. T/C to [facility]. Nehemiah Settle, case discussed, including:  HPI, pertinent PM/SHx, VS/PE, dx testing, ED course and treatment:  Agreeable to admit, requests to write temporary orders, obtain observation tele bed to team 1.   Francine Graven, DO 06/08/14 1635

## 2014-06-06 NOTE — H&P (Signed)
History and Physical  Samantha Odonnell JJO:[id number] DOB: [birth year] DOA: 06/06/2014  Referring physician: Dr Thurnell Garbe, ED physician PCP: Curlene Labrum, MD   Chief Complaint: Chest pain  HPI: Samantha Odonnell is a 68 y.o. female  With postsurgical hypothyroidism, history of thyroid cancer, surgical hypoparathyroidism, hypertension, atrial fibrillation on rate control, not a candidate for anticoagulation due to history of GI bleed.  Patient brought to the hospital by EMS due to chest pain that felt like she was punched in the chest that started after lunch. Pain radiated up into her neck and left arm. She was nauseated, vomited and was diaphoretic. Patient took 2 nitroglycerin about 15 minutes apart. EMS came, evaluated her, and brought her to the hospital. The nitroglycerin improved her pain somewhat. 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  She does not currently see a cardiologist and has no history of heart attack.   Review of Systems:   Pt complains of headache, mild abdominal pain.  Pt denies any fevers, chills, shortness of breath, palpitations, blurred vision, dysuria.  Review of systems are otherwise negative  Past Medical History  Diagnosis Date  . Vascular dementia, uncomplicated   . Unspecified epilepsy without mention of intractable epilepsy   . Tachycardia, unspecified   . Hypoparathyroidism   . Malignant neoplasm of thyroid gland   . Personal history of malignant neoplasm of breast   . Unspecified hypothyroidism   . Esophageal reflux   . Unspecified urinary incontinence   . Insomnia, unspecified   . Migraine   . Anxiety state, unspecified   . Hypopotassemia   . Wolff-Parkinson-White (WPW) syndrome   . Mitral regurgitation     Moderate, echo, 01/2012  . HLD (hyperlipidemia)   . Uterine cancer   . Breast cancer   . Thyroid cancer   . Atrial fibrillation     no anticoagulation due to hx GI bleeding   Past  Surgical History  Procedure Laterality Date  . Abdominal hysterectomy    . Neck surgery     Social History:  reports that she has never smoked. She has never used smokeless tobacco. She reports that she does not drink alcohol or use illicit drugs. Patient lives at [facility] & is able to participate in activities of daily living with assistance  Allergies  Allergen Reactions  . Adhesive [Tape]   . Asa [Aspirin] Other (See Comments)    ulcers  . Cyclobenzaprine   . Imdur [Isosorbide]     Headache, and rash   . Iodine   . Pentazocine   . Propoxyphene And Methadone   . Septra [Sulfamethoxazole-Trimethoprim]   . Sulfa Antibiotics   . Tegretol [Carbamazepine]   . Tetracyclines & Related     Family History  Problem Relation Age of Onset  . Cancer Mother   . Hyperlipidemia Father   . Cancer Father      Prior to Admission medications   Medication Sig Start Date End Date Taking? Authorizing Provider  acetaminophen (TYLENOL) 325 MG tablet Take 650 mg by mouth every 6 (six) hours as needed. pain   Yes Historical Provider, MD  calcitRIOL (ROCALTROL) 0.25 MCG capsule Take 0.25 mcg by mouth 2 (two) times daily.   Yes Historical Provider, MD  calcium carbonate (TUMS - DOSED IN MG ELEMENTAL CALCIUM) 500 MG chewable tablet Chew 1 tablet by mouth 3 (three) times daily.   Yes Historical Provider, MD  Cholecalciferol (VITAMIN D)  2000 UNITS CAPS Take 1 capsule by mouth daily.   Yes Historical Provider, MD  diltiazem (TIAZAC) 360 MG 24 hr capsule Take 360 mg by mouth daily.   Yes Historical Provider, MD  divalproex (DEPAKOTE ER) 500 MG 24 hr tablet Take 500 mg by mouth at bedtime.   Yes Historical Provider, MD  DULoxetine (CYMBALTA) 60 MG capsule Take 60 mg by mouth 2 (two) times daily.   Yes Historical Provider, MD  esomeprazole (NEXIUM) 40 MG capsule Take 40 mg by mouth 2 (two) times daily before a meal.    Yes Historical Provider, MD  levothyroxine (SYNTHROID, LEVOTHROID)  150 MCG tablet Take 150 mcg by mouth daily before breakfast.   Yes Historical Provider, MD  metoprolol succinate (TOPROL-XL) 100 MG 24 hr tablet Take 100 mg by mouth daily. Take with or immediately following a meal.   Yes Historical Provider, MD  Multiple Vitamin (MULTIVITAMIN) tablet Take 1 tablet by mouth daily.   Yes Historical Provider, MD  nitroGLYCERIN (NITROSTAT) 0.4 MG SL tablet Place 0.4 mg under the tongue every 5 (five) minutes as needed for chest pain.   Yes Historical Provider, MD  potassium chloride SA (K-DUR,KLOR-CON) 20 MEQ tablet Take 40 mEq by mouth 2 (two) times daily.   Yes Historical Provider, MD  rOPINIRole (REQUIP) 2 MG tablet Take 2 mg by mouth at bedtime.    Yes Historical Provider, MD  tiZANidine (ZANAFLEX) 4 MG tablet Take 4 mg by mouth every 6 (six) hours as needed for muscle spasms.     Historical Provider, MD    Physical Exam: BP 154/85  Pulse 91  Temp(Src) 98.5 F (36.9 C) (Oral)  Resp 18  Ht 5' 1.25" (1.556 m)  SpO2 97%  General: Elderly Caucasian female. Awake and alert and oriented x3. No acute cardiopulmonary distress.  Eyes: Pupils equal, round, reactive to light. Extraocular muscles are intact. Sclerae anicteric and noninjected.  ENT: External auditory canals are patent and tympanic membranes reflect a good cone of light. Moist mucosal membranes. No mucosal lesions. Teeth in moderate repair  Neck: Neck supple without lymphadenopathy. No carotid bruits. No masses palpated.  Cardiovascular: Irregularly irregular. No murmurs, rubs, gallops auscultated. No JVD.  Respiratory: Good respiratory effort with no wheezes, rales, rhonchi. Lungs clear to auscultation bilaterally.  Abdomen: Soft, mild tenderness in the lower quadrants, nondistended. Active bowel sounds. No masses or hepatosplenomegaly  Skin: Dry, warm to touch. 2+ dorsalis pedis and radial pulses. Musculoskeletal: No calf or leg pain. All major joints not erythematous nontender.  Psychiatric: Intact  judgment and insight.  Neurologic: No focal neurological deficits. Cranial nerves II through XII are grossly intact.           Labs on Admission:  Basic Metabolic Panel:  Recent Labs Lab 06/06/14 1429  NA 135*  K 4.3  CL 97  CO2 23  GLUCOSE 102*  BUN 16  CREATININE 0.68  CALCIUM 7.6*   Liver Function Tests: No results found for this basename: AST, ALT, ALKPHOS, BILITOT, PROT, ALBUMIN,  in the last 168 hours No results found for this basename: LIPASE, AMYLASE,  in the last 168 hours No results found for this basename: AMMONIA,  in the last 168 hours CBC:  Recent Labs Lab 06/06/14 1426  WBC 4.6  NEUTROABS 2.5  HGB 10.9*  HCT 33.0*  MCV 80.3  PLT 161   Cardiac Enzymes:  Recent Labs Lab 06/06/14 1426  TROPONINI <0.30    BNP (last 3 results) No results found for  this basename: PROBNP,  in the last 8760 hours CBG: No results found for this basename: GLUCAP,  in the last 168 hours  Radiological Exams on Admission: Dg Chest Port 1 View  06/06/2014   CLINICAL DATA:  Chest pain.  EXAM: PORTABLE CHEST - 1 VIEW  COMPARISON:  None.  FINDINGS: There is hyperinflation of the lungs compatible with COPD. Heart is borderline in size. Left central line tip is at the cavoatrial junction. Suspect hiatal hernia. Bibasilar densities likely reflect atelectasis. No effusions. No acute bony abnormality.  IMPRESSION: COPD.  Bibasilar atelectasis.   Electronically Signed   By: Rolm Baptise M.D.   On: 06/06/2014 14:53    EKG: Independently reviewed. Atrial fibrillation with ventricular rate in the 90s. Normal QRS interval. No ST changes.  Assessment/Plan Present on Admission:  . Chest pain  #1 chest pain Admit for observation and continue troponins series.  Continue on telemetry.  #2 hypothyroidism Continue levothyroxin  #3 atrial fibrillation Not candidate for anticoagulation due to history of GI bleed. Continue rate control with Cardizem  #4 hypertension Continue  Toprol  DVT prophylaxis: Lovenox  Consultants: None  Code Status: Full  Family Communication: None   Disposition Plan: Return to assisted living facility  Time spent: 50 minutes  [place], [place] [facility] Pager [phone]  **Disclaimer: This note may have been dictated with voice recognition software. Similar sounding words can inadvertently be transcribed and this note may contain transcription errors which may not have been corrected upon publication of note.**

## 2014-06-06 NOTE — ED Notes (Addendum)
Pt was eating lunch and vomited. She then felt stabbing pain in her left chest. The pain radiated to her left arm and left neck area. She is a cancer patient in remission so has implanted port in left IJ. Pain was 10/10 at time of EMS call but is now 4/10. She took 2 nitroglycerin tabs 15 minutes before EMS arrived. NAD noted.

## 2014-06-06 NOTE — ED Notes (Signed)
Chest pain began at ~1300. Twisting, stabbing pain that radiated to jaw and left arm. Pt took NTG x 2 and dropped her BP to 88/58 on scene. Pt also had 324 of asa en route. Pt states pain rated at 10 initially when pain began, now rates pain at 2. Pt states hx of cardiac arrest x 5 in the past. Pt states she vomited x 1 at 12:25 before pain began.

## 2014-06-07 DIAGNOSIS — E785 Hyperlipidemia, unspecified: Secondary | ICD-10-CM

## 2014-06-07 DIAGNOSIS — I059 Rheumatic mitral valve disease, unspecified: Secondary | ICD-10-CM | POA: Diagnosis not present

## 2014-06-07 DIAGNOSIS — I4891 Unspecified atrial fibrillation: Secondary | ICD-10-CM | POA: Diagnosis present

## 2014-06-07 DIAGNOSIS — F0150 Vascular dementia without behavioral disturbance: Secondary | ICD-10-CM | POA: Diagnosis not present

## 2014-06-07 DIAGNOSIS — R079 Chest pain, unspecified: Secondary | ICD-10-CM | POA: Diagnosis not present

## 2014-06-07 DIAGNOSIS — G40909 Epilepsy, unspecified, not intractable, without status epilepticus: Secondary | ICD-10-CM | POA: Diagnosis not present

## 2014-06-07 LAB — BASIC METABOLIC PANEL
ANION GAP: 14 (ref 5–15)
BUN: 12 mg/dL (ref 6–23)
CALCIUM: 7.2 mg/dL — AB (ref 8.4–10.5)
CO2: 25 mEq/L (ref 19–32)
CREATININE: 0.6 mg/dL (ref 0.50–1.10)
Chloride: 94 mEq/L — ABNORMAL LOW (ref 96–112)
GFR calc non Af Amer: >90 mL/min (ref >90)
Glucose, Bld: 99 mg/dL (ref 70–99)
Potassium: 3.9 mEq/L (ref 3.7–5.3)
SODIUM: 133 meq/L — AB (ref 137–147)

## 2014-06-07 LAB — TROPONIN I: Troponin I: <0.3 ng/mL (ref <0.30)

## 2014-06-07 LAB — CBC
HEMATOCRIT: 32.4 % — AB (ref 36.0–46.0)
Hemoglobin: 10.9 g/dL — ABNORMAL LOW (ref 12.0–15.0)
MCH: 27 pg (ref 26.0–34.0)
MCHC: 33.6 g/dL (ref 30.0–36.0)
MCV: 80.4 fL (ref 78.0–100.0)
PLATELETS: 166 10*3/uL (ref 150–400)
RBC: 4.03 MIL/uL (ref 3.87–5.11)
WBC: 4.6 10*3/uL (ref 4.0–10.5)

## 2014-06-07 LAB — TSH: TSH: 3.62 u[IU]/mL (ref 0.350–4.500)

## 2014-06-07 MED ORDER — METOPROLOL TARTRATE 1 MG/ML IV SOLN
5.0000 mg | Freq: Once | INTRAVENOUS | Status: AC
  Administered 2014-06-07: 5 mg via INTRAVENOUS
  Filled 2014-06-07: qty 5

## 2014-06-07 MED ORDER — DILTIAZEM HCL 25 MG/5ML IV SOLN
10.0000 mg | Freq: Once | INTRAVENOUS | Status: AC
  Administered 2014-06-07: 10 mg via INTRAVENOUS
  Filled 2014-06-07: qty 5

## 2014-06-07 MED ORDER — ONDANSETRON HCL 4 MG/2ML IJ SOLN
4.0000 mg | Freq: Four times a day (QID) | INTRAMUSCULAR | Status: DC | PRN
  Administered 2014-06-08 (×2): 4 mg via INTRAVENOUS
  Filled 2014-06-07 (×4): qty 2

## 2014-06-07 NOTE — Progress Notes (Signed)
Echocardiogram 2D Echocardiogram has been performed.  Samantha Odonnell 06/07/2014, 4:01 PM

## 2014-06-07 NOTE — Progress Notes (Signed)
EKG results in EPIC.  Abnormal EKG.  Dr. Tana Coast notified via text page.

## 2014-06-07 NOTE — Progress Notes (Signed)
Patient requesting something for her "stomach".  Asked about Zofran. States she "tried Maalox last night, but it did not help."  Dr. Tana Coast notified via text page.

## 2014-06-07 NOTE — Progress Notes (Signed)
Patient's BP 142/112.  Heart rate afib 105-130s on tele.  Patient report no chest pain or sob.  Resting in bed.  Complains of headache.  Patient states she has history of headaches.  Dr. Tana Coast notified via text page.

## 2014-06-07 NOTE — Progress Notes (Signed)
UR completed 

## 2014-06-07 NOTE — Progress Notes (Signed)
Patient's BP 138/67 and heart rate 100-120 afib on tele.  Patient reports no dizziness, chest pain or shortness of breath.  Dr. Tana Coast notified.  Gave order for Lopressor 5 mg IV for one dose.

## 2014-06-07 NOTE — Progress Notes (Signed)
Patient ID: Samantha Odonnell  female  GEX:[id number]    DOB: 04/24/1946    DOA: 06/06/2014  PCP: Curlene Labrum, MD  Assessment/Plan: Principal Problem:   Chest pain: Currently improved - a cath in 2008 with normal coronaries in [place]  - low risk ETT myoview 09/2012, small partially reversible anterior defect possibly due to attenuation  - echo 09/2012 LVEF 60-65%, moderate MR  - Troponins x3 negative, EKG showed afib with RVR - Obtain 2-D echocardiogram for further workup, cardiology consulted  Active Problems:   Atrial fibrillation with RVR - Will give Cardizem 10 mg IV x1, continue her Cardizem and Toprol at outpatient dose - Patient is not a candidate for anticoagulation due to prior history of GI bleed and peptic ulcers    Postsurgical hypothyroidism - Will check TSH, continue Synthroid    Mitral regurgitation - echo 09/2012 LVEF 60-65%, moderate MR, repeat 2-D echo     Hypertension - Restart Cardizem and Toprol  DVT Prophylaxis: Lovenox Code Status: Full code  Family Communication:  Disposition:  Consultants:  Cardiology  Procedures:  None  Antibiotics:  None    Subjective: Patient seen and examined, having Afib with RVR, no significant chest pain, dizziness, shortness of breath  Objective: Weight change:  No intake or output data in the 24 hours ending 06/07/14 0927 Blood pressure 127/49, pulse 104, temperature 98.5 F (36.9 C), temperature source Oral, resp. rate 20, height 5' 1.25" (1.556 m), weight 67.586 kg (149 lb), SpO2 97.00%.  Physical Exam: General: Alert and awake, oriented x3, not in any acute distress. CVS: Irregularly irregular, tachycardia Chest: clear to auscultation bilaterally, no wheezing, rales or rhonchi Abdomen: soft nontender, nondistended, normal bowel sounds  Extremities: no cyanosis, clubbing or edema noted bilaterally Neuro: Cranial nerves II-XII intact, no focal neurological deficits  Lab Results: Basic  Metabolic Panel:  Recent Labs Lab 06/06/14 1429 06/07/14 0236  NA 135* 133*  K 4.3 3.9  CL 97 94*  CO2 23 25  GLUCOSE 102* 99  BUN 16 12  CREATININE 0.68 0.60  CALCIUM 7.6* 7.2*   Liver Function Tests: No results found for this basename: AST, ALT, ALKPHOS, BILITOT, PROT, ALBUMIN,  in the last 168 hours No results found for this basename: LIPASE, AMYLASE,  in the last 168 hours No results found for this basename: AMMONIA,  in the last 168 hours CBC:  Recent Labs Lab 06/06/14 1426 06/07/14 0236  WBC 4.6 4.6  NEUTROABS 2.5  --   HGB 10.9* 10.9*  HCT 33.0* 32.4*  MCV 80.3 80.4  PLT 161 166   Cardiac Enzymes:  Recent Labs Lab 06/06/14 1426 06/06/14 2024 06/07/14 0236  TROPONINI <0.30 <0.30 <0.30   BNP: No components found with this basename: POCBNP,  CBG: No results found for this basename: GLUCAP,  in the last 168 hours   Micro Results: No results found for this or any previous visit (from the past 240 hour(s)).  Studies/Results: Dg Chest Port 1 View  06/06/2014   CLINICAL DATA:  Chest pain.  EXAM: PORTABLE CHEST - 1 VIEW  COMPARISON:  None.  FINDINGS: There is hyperinflation of the lungs compatible with COPD. Heart is borderline in size. Left central line tip is at the cavoatrial junction. Suspect hiatal hernia. Bibasilar densities likely reflect atelectasis. No effusions. No acute bony abnormality.  IMPRESSION: COPD.  Bibasilar atelectasis.   Electronically Signed   By: Rolm Baptise M.D.   On: 06/06/2014 14:53    Medications: Scheduled Meds: .  diltiazem  360 mg Oral Daily  . divalproex  500 mg Oral QHS  . DULoxetine  60 mg Oral BID  . enoxaparin (LOVENOX) injection  40 mg Subcutaneous Q24H  . levothyroxine  150 mcg Oral QAC breakfast  . metoprolol succinate  100 mg Oral Daily  . pantoprazole  40 mg Oral Daily  . potassium chloride SA  40 mEq Oral BID  . rOPINIRole  2 mg Oral QHS  . sodium chloride  3 mL Intravenous Q12H      LOS: 1 day    Arnez Stoneking M.D. [facility] 06/07/2014, 9:27 AM Pager: [phone]  If 7PM-7AM, please contact night-coverage [email or web address] Password [email or web address]  **Disclaimer: This note was dictated with voice recognition software. Similar sounding words can inadvertently be transcribed and this note may contain transcription errors which may not have been corrected upon publication of note.**

## 2014-06-08 DIAGNOSIS — R079 Chest pain, unspecified: Secondary | ICD-10-CM

## 2014-06-08 DIAGNOSIS — Z853 Personal history of malignant neoplasm of breast: Secondary | ICD-10-CM | POA: Diagnosis not present

## 2014-06-08 DIAGNOSIS — I10 Essential (primary) hypertension: Secondary | ICD-10-CM | POA: Diagnosis not present

## 2014-06-08 DIAGNOSIS — K219 Gastro-esophageal reflux disease without esophagitis: Secondary | ICD-10-CM | POA: Diagnosis present

## 2014-06-08 DIAGNOSIS — E209 Hypoparathyroidism, unspecified: Secondary | ICD-10-CM | POA: Diagnosis present

## 2014-06-08 DIAGNOSIS — I4891 Unspecified atrial fibrillation: Principal | ICD-10-CM

## 2014-06-08 DIAGNOSIS — E890 Postprocedural hypothyroidism: Secondary | ICD-10-CM | POA: Diagnosis present

## 2014-06-08 DIAGNOSIS — I456 Pre-excitation syndrome: Secondary | ICD-10-CM | POA: Diagnosis present

## 2014-06-08 DIAGNOSIS — F0150 Vascular dementia without behavioral disturbance: Secondary | ICD-10-CM | POA: Diagnosis present

## 2014-06-08 DIAGNOSIS — Z8542 Personal history of malignant neoplasm of other parts of uterus: Secondary | ICD-10-CM | POA: Diagnosis not present

## 2014-06-08 DIAGNOSIS — E785 Hyperlipidemia, unspecified: Secondary | ICD-10-CM | POA: Diagnosis not present

## 2014-06-08 DIAGNOSIS — G40909 Epilepsy, unspecified, not intractable, without status epilepticus: Secondary | ICD-10-CM | POA: Diagnosis present

## 2014-06-08 DIAGNOSIS — Z85850 Personal history of malignant neoplasm of thyroid: Secondary | ICD-10-CM | POA: Diagnosis not present

## 2014-06-08 LAB — BASIC METABOLIC PANEL
ANION GAP: 15 (ref 5–15)
BUN: 10 mg/dL (ref 6–23)
CO2: 27 mEq/L (ref 19–32)
Calcium: 7 mg/dL — ABNORMAL LOW (ref 8.4–10.5)
Chloride: 94 mEq/L — ABNORMAL LOW (ref 96–112)
Creatinine, Ser: 0.56 mg/dL (ref 0.50–1.10)
GFR calc Af Amer: >90 mL/min (ref >90)
Glucose, Bld: 101 mg/dL — ABNORMAL HIGH (ref 70–99)
Potassium: 4 mEq/L (ref 3.7–5.3)
SODIUM: 136 meq/L — AB (ref 137–147)

## 2014-06-08 LAB — CBC
HCT: 37.8 % (ref 36.0–46.0)
Hemoglobin: 12.7 g/dL (ref 12.0–15.0)
MCH: 27 pg (ref 26.0–34.0)
MCHC: 33.6 g/dL (ref 30.0–36.0)
MCV: 80.4 fL (ref 78.0–100.0)
Platelets: 204 K/uL (ref 150–400)
RBC: 4.7 MIL/uL (ref 3.87–5.11)
WBC: 4.1 K/uL (ref 4.0–10.5)

## 2014-06-08 MED ORDER — METOPROLOL TARTRATE 1 MG/ML IV SOLN
5.0000 mg | Freq: Once | INTRAVENOUS | Status: AC
  Administered 2014-06-08: 5 mg via INTRAVENOUS
  Filled 2014-06-08: qty 5

## 2014-06-08 MED ORDER — METOPROLOL SUCCINATE ER 50 MG PO TB24
150.0000 mg | ORAL_TABLET | Freq: Every day | ORAL | Status: DC
  Administered 2014-06-09: 150 mg via ORAL
  Filled 2014-06-08: qty 3

## 2014-06-08 MED ORDER — METOPROLOL SUCCINATE ER 50 MG PO TB24: 150.0000 mg | ORAL_TABLET | Freq: Every day | ORAL | Status: DC

## 2014-06-08 NOTE — Consult Note (Signed)
Primary Cardiologist: Dr Carlyle Dolly MD Consulting cardiologist: Dr Carlyle Dolly MD  Clinical Summary Samantha Odonnell is a 68 y.o.female hx of thyroid CA, hypothryoidism, HTN,afib not on anticoag due to hx of GI bleed admitted with chest pain. She has a history of intermittent chest pain with negative cath in 2008, most recent nuclear stress in 2013 negative. She has been followed by GI for possible GI etiology of her symptoms  Episode of chest pain at home shortly after eating. Heavy pressure left chest, with + SOB and palpitations. Lasted a few minutes. Somewhat better with position. Took NG with some relief. Reports despite history of chest pain she had actually been doing quite well and this is first episode in quite some time.   Cath 2008 in [place] negative per report 09/2012 MPI low risk, anteroapical defect likely artifact. Trop neg x 4, K 4.0, Cr 0.56, Hgb 10.9, Plt 161 CXR: COPD changes. EKG afib without specific ischemic changes Echo LVEF 55-60%, no WMAs. Restrictive diastolic function  Allergies  Allergen Reactions  . Adhesive [Tape]   . Asa [Aspirin] Other (See Comments)    ulcers  . Cyclobenzaprine   . Imdur [Isosorbide]     Headache, and rash   . Iodine   . Pentazocine   . Propoxyphene And Methadone   . Septra [Sulfamethoxazole-Trimethoprim]   . Sulfa Antibiotics   . Tegretol [Carbamazepine]   . Tetracyclines & Related     Medications Scheduled Medications: . diltiazem  360 mg Oral Daily  . divalproex  500 mg Oral QHS  . DULoxetine  60 mg Oral BID  . enoxaparin (LOVENOX) injection  40 mg Subcutaneous Q24H  . levothyroxine  150 mcg Oral QAC breakfast  . metoprolol succinate  100 mg Oral Daily  . pantoprazole  40 mg Oral Daily  . potassium chloride SA  40 mEq Oral BID  . rOPINIRole  2 mg Oral QHS  . sodium chloride  3 mL Intravenous Q12H     Infusions:     PRN Medications:  acetaminophen, acetaminophen, alum & mag hydroxide-simeth,  butalbital-acetaminophen-caffeine, docusate sodium, LORazepam, morphine injection, nitroGLYCERIN, ondansetron (ZOFRAN) IV, tiZANidine   Past Medical History  Diagnosis Date  . Vascular dementia, uncomplicated   . Unspecified epilepsy without mention of intractable epilepsy   . Tachycardia, unspecified   . Hypoparathyroidism   . Malignant neoplasm of thyroid gland   . Personal history of malignant neoplasm of breast   . Unspecified hypothyroidism   . Esophageal reflux   . Unspecified urinary incontinence   . Insomnia, unspecified   . Migraine   . Anxiety state, unspecified   . Hypopotassemia   . Wolff-Parkinson-White (WPW) syndrome   . Mitral regurgitation     Moderate, echo, 01/2012  . HLD (hyperlipidemia)   . Uterine cancer   . Breast cancer   . Thyroid cancer   . Atrial fibrillation     no anticoagulation due to hx GI bleeding    Past Surgical History  Procedure Laterality Date  . Abdominal hysterectomy    . Neck surgery      Family History  Problem Relation Age of Onset  . Cancer Mother   . Hyperlipidemia Father   . Cancer Father     Social History Samantha Odonnell reports that she has never smoked. She has never used smokeless tobacco. Samantha Odonnell reports that she does not drink alcohol.  Review of Systems CONSTITUTIONAL: No weight loss, fever, chills, weakness or fatigue.  HEENT: Eyes: No  visual loss, blurred vision, double vision or yellow sclerae. No hearing loss, sneezing, congestion, runny nose or sore throat.  SKIN: No rash or itching.  CARDIOVASCULAR: per HPI RESPIRATORY: No shortness of breath, cough or sputum.  GASTROINTESTINAL: No anorexia, nausea, vomiting or diarrhea. No abdominal pain or blood.  GENITOURINARY: no polyuria, no dysuria NEUROLOGICAL: No headache, dizziness, syncope, paralysis, ataxia, numbness or tingling in the extremities. No change in bowel or bladder control.  MUSCULOSKELETAL: No muscle, back pain, joint pain or stiffness.  HEMATOLOGIC:  No anemia, bleeding or bruising.  LYMPHATICS: No enlarged nodes. No history of splenectomy.  PSYCHIATRIC: No history of depression or anxiety.      Physical Examination Blood pressure 120/86, pulse 84, temperature 98.3 F (36.8 C), temperature source Oral, resp. rate 20, height 5' 1.25" (1.556 m), weight 158 lb 4.6 oz (71.8 kg), SpO2 99.00%.  Intake/Output Summary (Last 24 hours) at 06/08/14 0859 Last data filed at 06/07/14 1800  Gross per 24 hour  Intake    240 ml  Output      0 ml  Net    240 ml    Cardiovascular: irreg, rate 130,  No m/r/g, no jVD  Respiratory: CTAB  GI: abdomen soft, NT, ND  MSK: no LE edema  Neuro: no focal deficits  Psych: appropriate affect   Lab Results  Basic Metabolic Panel:  Recent Labs Lab 06/06/14 1429 06/07/14 0236 06/08/14 0641  NA 135* 133* 136*  K 4.3 3.9 4.0  CL 97 94* 94*  CO2 23 25 27   GLUCOSE 102* 99 101*  BUN 16 12 10   CREATININE 0.68 0.60 0.56  CALCIUM 7.6* 7.2* 7.0*    Liver Function Tests: No results found for this basename: AST, ALT, ALKPHOS, BILITOT, PROT, ALBUMIN,  in the last 168 hours  CBC:  Recent Labs Lab 06/06/14 1426 06/07/14 0236 06/08/14 0641  WBC 4.6 4.6 4.1  NEUTROABS 2.5  --   --   HGB 10.9* 10.9* 12.7  HCT 33.0* 32.4* 37.8  MCV 80.3 80.4 80.4  PLT 161 166 204    Cardiac Enzymes:  Recent Labs Lab 06/06/14 1426 06/06/14 2024 06/07/14 0236 06/07/14 0834  TROPONINI <0.30 <0.30 <0.30 <0.30    BNP: No components found with this basename: POCBNP,    ECG   Imaging 06/07/2014 Echo Study Conclusions  - Left ventricle: The cavity size was normal. Wall thickness was increased in a pattern of mild LVH. Systolic function was normal. The estimated ejection fraction was in the range of 55% to 60%. Wall motion was normal; there were no regional wall motion abnormalities. Doppler parameters are consistent with restrictive physiology, indicative of decreased left ventricular  diastolic compliance and/or increased left atrial pressure. - Aortic valve: Mildly calcified annulus. Trileaflet; mildly thickened leaflets. Valve area (VTI): 2.19 cm^2. Valve area (Vmax): 1.83 cm^2. - Mitral valve: There was mild regurgitation. The MR vena contracta is 0.2 cm. - Left atrium: The atrium was severely dilated. - Right atrium: The atrium was moderately dilated. - Technically adequate study.  09/2012 MPI Impression  Exercise Capacity: Lexiscan with no exercise.  BP Response: Normal blood pressure response.  Clinical Symptoms: There is chest pain and dyspnea.  ECG Impression: No significant ST segment change suggestive of ischemia.  Comparison with Prior Nuclear Study: No images to compare  Overall Impression: Low risk stress nuclear study with a small, medium intensity, partially reversible anterior/apical defect consistent with soft tissue attenuation and mild ischemia.  LV Ejection Fraction: 68%. LV Wall Motion:  NL LV Function; NL Wall Motion  Kirk Ruths  Possible mild anterior ischemia. Needs to be seen back in the office to discuss.   Impression/Recommendations 1. Chest pain - unclear etiology, no evidence of ACS at this time - history of chest pain with prior negative workups, last stress negative nearly 2 years ago. Had been symptom free for some time, first severe episode in some time - plan for Lexiscan MPI tomorrow, she is in afib with RVR currently and cannot stress today  2. Afib - rates elevated this AM to 130s, she is asymptomatic - will write for metoprolol 5mg  IV x1 now, then increase her Toprol XL to 150mg  daily - she has history of GI bleeds and has not been anticoagulated. Hx of ulcers on ASA previously    Carlyle Dolly, M.D., F.A.C.C.

## 2014-06-08 NOTE — Progress Notes (Signed)
UR Completed.  Samantha Odonnell Jane [phone] 06/08/2014  

## 2014-06-08 NOTE — Progress Notes (Addendum)
Patient ID: Samantha Odonnell  female  HUT:[id number]    DOB: [birth year]    DOA: 06/06/2014  PCP: Curlene Labrum, MD  Assessment/Plan: Principal Problem:   Chest pain: Currently improved - a cath in 2008 with normal coronaries in [place], [place],  low risk ETT myoview 09/2012, small partially reversible anterior defect possibly due to attenuation. Echo 09/2012 LVEF 60-65%, moderate MR  - Troponins x3 negative, EKG showed afib with RVR - Discussed with cardiology,Dr Harl Bowie, recommended a medicine stress test in a.m. 9/1 - 2-D echo 8/30 showed EF of 55-60%, normal wall motion  Active Problems:   Atrial fibrillation with RVR: Heart rate still not well controlled  - received extra Cardizem 10 mg, Lopressor 5 mg yesterday, currently on oral Cardizem and Toprol at outpatient dose - Patient is not a candidate for anticoagulation due to prior history of GI bleed and peptic ulcers - Cardiology following     Postsurgical hypothyroidism - TSH 3.6, continue Synthroid    Mitral regurgitation - 2-D echo 8/30 showed EF of 55-60%, mild mitral regurgitation    Hypertension - Continue Cardizem and Toprol  DVT Prophylaxis: Lovenox  Code Status: Full code  Family Communication:  Disposition:  Consultants:  Cardiology  Procedures:  None  Antibiotics:  None    Subjective: Still in Afib, no chest pain, shortness of breath, dizziness   Objective: Weight change: 4.214 kg (9 lb 4.6 oz)  Intake/Output Summary (Last 24 hours) at 06/08/14 0920 Last data filed at 06/07/14 1800  Gross per 24 hour  Intake    240 ml  Output      0 ml  Net    240 ml   Blood pressure 120/86, pulse 84, temperature 98.3 F (36.8 C), temperature source Oral, resp. rate 20, height 5' 1.25" (1.556 m), weight 71.8 kg (158 lb 4.6 oz), SpO2 99.00%.  Physical Exam: General: A x O x3, NAD. CVS: Irregularly irregular Chest: CTAB Abdomen: soft NT, ND, NBS  Extremities: no cyanosis, clubbing or edema noted  bilaterally   Lab Results: Basic Metabolic Panel:  Recent Labs Lab 06/07/14 0236 06/08/14 0641  NA 133* 136*  K 3.9 4.0  CL 94* 94*  CO2 25 27  GLUCOSE 99 101*  BUN 12 10  CREATININE 0.60 0.56  CALCIUM 7.2* 7.0*   Liver Function Tests: No results found for this basename: AST, ALT, ALKPHOS, BILITOT, PROT, ALBUMIN,  in the last 168 hours No results found for this basename: LIPASE, AMYLASE,  in the last 168 hours No results found for this basename: AMMONIA,  in the last 168 hours CBC:  Recent Labs Lab 06/06/14 1426 06/07/14 0236 06/08/14 0641  WBC 4.6 4.6 4.1  NEUTROABS 2.5  --   --   HGB 10.9* 10.9* 12.7  HCT 33.0* 32.4* 37.8  MCV 80.3 80.4 80.4  PLT 161 166 204   Cardiac Enzymes:  Recent Labs Lab 06/06/14 2024 06/07/14 0236 06/07/14 0834  TROPONINI <0.30 <0.30 <0.30   BNP: No components found with this basename: POCBNP,  CBG: No results found for this basename: GLUCAP,  in the last 168 hours   Micro Results: No results found for this or any previous visit (from the past 240 hour(s)).  Studies/Results: Dg Chest Port 1 View  06/06/2014   CLINICAL DATA:  Chest pain.  EXAM: PORTABLE CHEST - 1 VIEW  COMPARISON:  None.  FINDINGS: There is hyperinflation of the lungs compatible with COPD. Heart is borderline in size. Left central line tip  is at the cavoatrial junction. Suspect hiatal hernia. Bibasilar densities likely reflect atelectasis. No effusions. No acute bony abnormality.  IMPRESSION: COPD.  Bibasilar atelectasis.   Electronically Signed   By: Rolm Baptise M.D.   On: 06/06/2014 14:53    Medications: Scheduled Meds: . diltiazem  360 mg Oral Daily  . divalproex  500 mg Oral QHS  . DULoxetine  60 mg Oral BID  . enoxaparin (LOVENOX) injection  40 mg Subcutaneous Q24H  . levothyroxine  150 mcg Oral QAC breakfast  . metoprolol  5 mg Intravenous Once  . [START ON 06/09/2014] metoprolol succinate  150 mg Oral Daily  . pantoprazole  40 mg Oral Daily  .  potassium chloride SA  40 mEq Oral BID  . rOPINIRole  2 mg Oral QHS  . sodium chloride  3 mL Intravenous Q12H      LOS: 2 days   Hortencia Martire M.D. [facility] 06/08/2014, 9:20 AM Pager: [phone]  If 7PM-7AM, please contact night-coverage [email or web address] Password [email or web address]  **Disclaimer: This note was dictated with voice recognition software. Similar sounding words can inadvertently be transcribed and this note may contain transcription errors which may not have been corrected upon publication of note.**

## 2014-06-09 ENCOUNTER — Inpatient Hospital Stay (HOSPITAL_COMMUNITY): Payer: Medicare Other

## 2014-06-09 ENCOUNTER — Encounter (HOSPITAL_COMMUNITY): Payer: Self-pay

## 2014-06-09 DIAGNOSIS — R079 Chest pain, unspecified: Secondary | ICD-10-CM

## 2014-06-09 LAB — MAGNESIUM: Magnesium: 1.5 mg/dL (ref 1.5–2.5)

## 2014-06-09 MED ORDER — SODIUM CHLORIDE 0.9 % IJ SOLN
INTRAMUSCULAR | Status: AC
  Administered 2014-06-09: 10 mL via INTRAVENOUS
  Filled 2014-06-09: qty 10

## 2014-06-09 MED ORDER — TECHNETIUM TC 99M SESTAMIBI - CARDIOLITE
10.0000 | Freq: Once | INTRAVENOUS | Status: AC | PRN
  Administered 2014-06-09: 07:00:00 10 via INTRAVENOUS

## 2014-06-09 MED ORDER — MAGNESIUM SULFATE 40 MG/ML IJ SOLN
2.0000 g | Freq: Once | INTRAMUSCULAR | Status: AC
  Administered 2014-06-09: 2 g via INTRAVENOUS
  Filled 2014-06-09: qty 50

## 2014-06-09 MED ORDER — MAGNESIUM OXIDE 400 MG PO TABS: 400.0000 mg | ORAL_TABLET | Freq: Every day | ORAL | Status: DC

## 2014-06-09 MED ORDER — HEPARIN SOD (PORK) LOCK FLUSH 100 UNIT/ML IV SOLN
500.0000 [IU] | INTRAVENOUS | Status: DC | PRN
  Filled 2014-06-09: qty 5

## 2014-06-09 MED ORDER — SODIUM CHLORIDE 0.9 % IJ SOLN
INTRAMUSCULAR | Status: AC
  Filled 2014-06-09: qty 36

## 2014-06-09 MED ORDER — METOPROLOL SUCCINATE ER 50 MG PO TB24: 150.0000 mg | ORAL_TABLET | Freq: Every day | ORAL | Status: DC

## 2014-06-09 MED ORDER — MAGNESIUM SULFATE 50 % IJ SOLN: 2.0000 g | Freq: Once | INTRAVENOUS | Status: DC

## 2014-06-09 MED ORDER — REGADENOSON 0.4 MG/5ML IV SOLN
INTRAVENOUS | Status: AC
Start: 2014-06-09 — End: 2014-06-09
  Administered 2014-06-09: 0.4 mg via INTRAVENOUS
  Filled 2014-06-09: qty 5

## 2014-06-09 MED ORDER — SODIUM CHLORIDE 0.9 % IJ SOLN
10.0000 mL | INTRAMUSCULAR | Status: DC | PRN
  Administered 2014-06-09: 10 mL via INTRAVENOUS

## 2014-06-09 MED ORDER — REGADENOSON 0.4 MG/5ML IV SOLN
0.4000 mg | Freq: Once | INTRAVENOUS | Status: AC | PRN
  Administered 2014-06-09: 0.4 mg via INTRAVENOUS
  Filled 2014-06-09: qty 5

## 2014-06-09 MED ORDER — TECHNETIUM TC 99M SESTAMIBI GENERIC - CARDIOLITE
30.0000 | Freq: Once | INTRAVENOUS | Status: AC | PRN
  Administered 2014-06-09: 30 via INTRAVENOUS

## 2014-06-09 MED ORDER — ONDANSETRON HCL 4 MG/2ML IJ SOLN
4.0000 mg | Freq: Once | INTRAMUSCULAR | Status: AC
  Administered 2014-06-09: 4 mg via INTRAVENOUS

## 2014-06-09 NOTE — Clinical Documentation Improvement (Signed)
Presents with Chest Pain and AF. Previous cardiac workups appear negative.  After study, please clarify the likely etiology of the patient's Chest Pain and document findings in next Progress Note and include in Discharge Summary.  CP secondary to AF              to VT - 22 beat per telemetry seen in Nurses's Note   Chest Pain as documented; etiology unknown              Other Condition  Thank You, Zoila Shutter ,RN Clinical Documentation Specialist:  [facility] [employer]

## 2014-06-09 NOTE — Care Management Note (Signed)
    Page 1 of 1   06/09/2014     2:00:06 PM CARE MANAGEMENT NOTE 06/09/2014  Patient:  Samantha Odonnell, Samantha Odonnell   Account Number:  [id number]  Date Initiated:  06/09/2014  Documentation initiated by:  Jolene Provost  Subjective/Objective Assessment:   Pt is from [employer].     Action/Plan:   Pt plans to discharge home with self care today. No CM needs at this time.   Anticipated DC Date:  06/09/2014   Anticipated DC Plan:  [place]  CM consult      Choice offered to / List presented to:             Status of service:  Completed, signed off Medicare Important Message given?  YES (If response is "NO", the following Medicare IM given date fields will be blank) Date Medicare IM given:  06/09/2014 Medicare IM given by:  Jolene Provost Date Additional Medicare IM given:   Additional Medicare IM given by:    Discharge Disposition:  HOME/SELF CARE  Per UR Regulation:    If discussed at Long Length of Stay Meetings, dates discussed:    Comments:  06/09/2014 [place], RN, MSN, [facility]

## 2014-06-09 NOTE — Progress Notes (Signed)
Pt has had a 22 beat run of VT per telemetry. Pt is asymptomatic at this time. MD has been notified. Magnesium lab has been ordered. Will continue to monitor.

## 2014-06-09 NOTE — Discharge Summary (Signed)
Physician Discharge Summary  Patient ID: MARISELLA PUCCIO MRN: [id number] DOB/AGE: Mar 19, 1946 68 y.o.  Admit date: 06/06/2014 Discharge date: 06/09/2014  Primary Care Physician:  Curlene Labrum, MD  Discharge Diagnoses:    . Chest pain possibly from atrial fibrillation with RVR  . Atrial fibrillation with RVR . Hypertension . Mitral regurgitation . Postsurgical hypothyroidism . Atrial fibrillation . Hypomagnesemia  Consults:  Cardiology, Dr. Harl Bowie   Recommendations for Outpatient Follow-up:  Please followup BMET, magnesium level at the followup appointment  Allergies:   Allergies  Allergen Reactions  . Adhesive [Tape]   . Asa [Aspirin] Other (See Comments)    ulcers  . Cyclobenzaprine   . Imdur [Isosorbide]     Headache, and rash   . Iodine   . Pentazocine   . Propoxyphene And Methadone   . Septra [Sulfamethoxazole-Trimethoprim]   . Sulfa Antibiotics   . Tegretol [Carbamazepine]   . Tetracyclines & Related      Discharge Medications:   Medication List         acetaminophen 325 MG tablet  Commonly known as:  TYLENOL  Take 650 mg by mouth every 6 (six) hours as needed. pain     calcitRIOL 0.25 MCG capsule  Commonly known as:  ROCALTROL  Take 0.25 mcg by mouth 2 (two) times daily.     calcium carbonate 500 MG chewable tablet  Commonly known as:  TUMS - dosed in mg elemental calcium  Chew 1 tablet by mouth 3 (three) times daily.     diltiazem 360 MG 24 hr capsule  Commonly known as:  TIAZAC  Take 360 mg by mouth daily.     divalproex 500 MG 24 hr tablet  Commonly known as:  DEPAKOTE ER  Take 500 mg by mouth at bedtime.     DULoxetine 60 MG capsule  Commonly known as:  CYMBALTA  Take 60 mg by mouth 2 (two) times daily.     esomeprazole 40 MG capsule  Commonly known as:  NEXIUM  Take 40 mg by mouth 2 (two) times daily before a meal.     levothyroxine 150 MCG tablet  Commonly known as:  SYNTHROID, LEVOTHROID  Take 150 mcg by mouth daily before  breakfast.     magnesium oxide 400 MG tablet  Commonly known as:  MAG-OX  Take 1 tablet (400 mg total) by mouth daily. X 1 week     metoprolol succinate 50 MG 24 hr tablet  Commonly known as:  TOPROL-XL  Take 3 tablets (150 mg total) by mouth daily. Take with or immediately following a meal.     multivitamin tablet  Take 1 tablet by mouth daily.     nitroGLYCERIN 0.4 MG SL tablet  Commonly known as:  NITROSTAT  Place 0.4 mg under the tongue every 5 (five) minutes as needed for chest pain.     potassium chloride SA 20 MEQ tablet  Commonly known as:  K-DUR,KLOR-CON  Take 40 mEq by mouth 2 (two) times daily.     rOPINIRole 2 MG tablet  Commonly known as:  REQUIP  Take 2 mg by mouth at bedtime.     tiZANidine 4 MG tablet  Commonly known as:  ZANAFLEX  Take 4 mg by mouth every 6 (six) hours as needed for muscle spasms.     Vitamin D 2000 UNITS Caps  Take 1 capsule by mouth daily.         Brief H and P: For complete details please refer to admission  H and P, but in briefElaine P Crockett is a 68 y.o. female  With postsurgical hypothyroidism, history of thyroid cancer, surgical hypoparathyroidism, hypertension, atrial fibrillation on rate control, not a candidate for anticoagulation due to history of GI bleed. Patient brought to the hospital by EMS due to chest pain that felt like she was punched in the chest that started after lunch. Pain radiated up into her neck and left arm. She was nauseated, vomited and was diaphoretic. Patient took 2 nitroglycerin about 15 minutes apart. EMS came, evaluated her, and brought her to the hospital. The nitroglycerin improved her pain somewhat.    Hospital Course:  Chest pain likely due to atrial fibrillation with RVR: Currently improved patient was admitted for further workup. She was ruled out for acute ACS with negative troponins. EKG showed atrial fibrillation with RVR. Patient originally had a cardiac catheterization in 2008 with normal  coronaries in [place], [place], low risk ETT myoview 09/2012, small partially reversible anterior defect possibly due to attenuation. Echo 09/2012 LVEF 60-65%, moderate MR  2-D echo on 8/30 showed EF of 55-60%, normal wall motion  Nuclear medicine stress test showed no reversible ischemia or infarction, normal left ventricular wall motion, EF 45% low risk stress test findings. Patient will have followup appointment with cardiology in 2 weeks.  Atrial fibrillation with RVR: Heart rate not well controlled  - Patient is not a candidate for anticoagulation due to prior history of GI bleed and peptic ulcers  Cardiology increased Toprol XL 150 mg, continue Cardizem   Hypomagnesemia   Magnesium level I.5, patient was given magnesium IV 2 g x1 and placed on oral replacement  Postsurgical hypothyroidism  - TSH 3.6, continue Synthroid  Mitral regurgitation  - 2-D echo 8/30 showed EF of 55-60%, mild mitral regurgitation   Hypertension  - Continue Cardizem and Toprol    Day of Discharge BP 110/75  Pulse 98  Temp(Src) 98.2 F (36.8 C) (Oral)  Resp 18  Ht 5' 1.25" (1.556 m)  Wt 71.8 kg (158 lb 4.6 oz)  BMI 29.66 kg/m2  SpO2 96%  Physical Exam: General: Alert and awake oriented x3 not in any acute distress. CVS: Irregularly irregular Chest: clear to auscultation bilaterally, no wheezing rales or rhonchi Abdomen: soft nontender, nondistended, normal bowel sounds Extremities: no cyanosis, clubbing or edema noted bilaterally Neuro: Cranial nerves II-XII intact, no focal neurological deficits   The results of significant diagnostics from this hospitalization (including imaging, microbiology, ancillary and laboratory) are listed below for reference.    LAB RESULTS: Basic Metabolic Panel:  Recent Labs Lab 06/07/14 0236 06/08/14 0641 06/09/14 0606  NA 133* 136*  --   K 3.9 4.0  --   CL 94* 94*  --   CO2 25 27  --   GLUCOSE 99 101*  --   BUN 12 10  --   CREATININE 0.60 0.56  --    CALCIUM 7.2* 7.0*  --   MG  --   --  1.5   Liver Function Tests: No results found for this basename: AST, ALT, ALKPHOS, BILITOT, PROT, ALBUMIN,  in the last 168 hours No results found for this basename: LIPASE, AMYLASE,  in the last 168 hours No results found for this basename: AMMONIA,  in the last 168 hours CBC:  Recent Labs Lab 06/06/14 1426 06/07/14 0236 06/08/14 0641  WBC 4.6 4.6 4.1  NEUTROABS 2.5  --   --   HGB 10.9* 10.9* 12.7  HCT 33.0* 32.4* 37.8  MCV 80.3 80.4 80.4  PLT 161 166 204   Cardiac Enzymes:  Recent Labs Lab 06/07/14 0236 06/07/14 0834  TROPONINI <0.30 <0.30   BNP: No components found with this basename: POCBNP,  CBG: No results found for this basename: GLUCAP,  in the last 168 hours  Significant Diagnostic Studies:  Dg Chest Port 1 View  06/06/2014   CLINICAL DATA:  Chest pain.  EXAM: PORTABLE CHEST - 1 VIEW  COMPARISON:  None.  FINDINGS: There is hyperinflation of the lungs compatible with COPD. Heart is borderline in size. Left central line tip is at the cavoatrial junction. Suspect hiatal hernia. Bibasilar densities likely reflect atelectasis. No effusions. No acute bony abnormality.  IMPRESSION: COPD.  Bibasilar atelectasis.   Electronically Signed   By: Rolm Baptise M.D.   On: 06/06/2014 14:53    2D ECHO: Study Conclusions  - Left ventricle: The cavity size was normal. Wall thickness was increased in a pattern of mild LVH. Systolic function was normal. The estimated ejection fraction was in the range of 55% to 60%. Wall motion was normal; there were no regional wall motion abnormalities. Doppler parameters are consistent with restrictive physiology, indicative of decreased left ventricular diastolic compliance and/or increased left atrial pressure. - Aortic valve: Mildly calcified annulus. Trileaflet; mildly thickened leaflets. Valve area (VTI): 2.19 cm^2. Valve area (Vmax): 1.83 cm^2. - Mitral valve: There was mild regurgitation. The MR  vena contracta is 0.2 cm. - Left atrium: The atrium was severely dilated. - Right atrium: The atrium was moderately dilated. - Technically adequate study.    Disposition and Follow-up:     Discharge Instructions   Diet - low sodium heart healthy    Complete by:  As directed      Increase activity slowly    Complete by:  As directed             DISPOSITION: Home  DIET: Heart healthy     DISCHARGE FOLLOW-UP Follow-up Information   Follow up with Jory Sims, NP. Schedule an appointment as soon as possible for a visit in 2 weeks. (for hospital follow-up)    Specialty:  Nurse Practitioner   Contact information:   [place] [address] [phone]       Follow up with Curlene Labrum, MD. Schedule an appointment as soon as possible for a visit in 2 weeks. (for hospital follow-up)    Contact information:   [place] [address] [phone]       Time spent on Discharge: 35 mins  Signed:   Blake Goya M.D. [facility] 06/09/2014, 12:48 PM Pager: [phone]   **Disclaimer: This note was dictated with voice recognition software. Similar sounding words can inadvertently be transcribed and this note may contain transcription errors which may not have been corrected upon publication of note.**

## 2014-06-09 NOTE — Progress Notes (Signed)
UR review complete.  

## 2014-06-09 NOTE — Progress Notes (Signed)
Consulting cardiologist:Branch, Roderic Palau MD Primary Cardiologist: Carlyle Dolly MD  Subjective:    Complaints of generalized fatigue and nausea. Denies chest pain. (Patient seen prior to [facility] stress test in stress lab)  Objective:   Temp:  [98.2 F (36.8 C)-99 F (37.2 C)] 98.2 F (36.8 C) (09/01 0500) Pulse Rate:  [83-98] 98 (09/01 0500) Resp:  [18] 18 (09/01 0500) BP: (106-111)/(54-90) 110/75 mmHg (09/01 0500) SpO2:  [96 %-99 %] 96 % (09/01 0500) Last BM Date: 06/08/14  Filed Weights   06/06/14 1829 06/08/14 0524  Weight: 149 lb (67.586 kg) 158 lb 4.6 oz (71.8 kg)    Intake/Output Summary (Last 24 hours) at 06/09/14 0851 Last data filed at 06/09/14 0500  Gross per 24 hour  Intake   1260 ml  Output      0 ml  Net   1260 ml    Telemetry:Atrial fib with elevated HR 120 bpm.  Exam:  General: No acute distress.  HEENT: Conjunctiva and lids normal, oropharynx clear.  Lungs: Clear to auscultation, nonlabored. Port-A Cath in place to left chest.  Cardiac: No elevated JVP or bruits. IRRR, tahcycardic, no gallop or rub.   Abdomen: Normoactive bowel sounds, nontender, nondistended.  Extremities: No pitting edema, distal pulses full.  Neuropsychiatric: Alert and oriented x3, affect appropriate.   Lab Results:  Basic Metabolic Panel:  Recent Labs Lab 06/06/14 1429 06/07/14 0236 06/08/14 0641 06/09/14 0606  NA 135* 133* 136*  --   K 4.3 3.9 4.0  --   CL 97 94* 94*  --   CO2 23 25 27   --   GLUCOSE 102* 99 101*  --   BUN 16 12 10   --   CREATININE 0.68 0.60 0.56  --   CALCIUM 7.6* 7.2* 7.0*  --   MG  --   --   --  1.5    CBC:  Recent Labs Lab 06/06/14 1426 06/07/14 0236 06/08/14 0641  WBC 4.6 4.6 4.1  HGB 10.9* 10.9* 12.7  HCT 33.0* 32.4* 37.8  MCV 80.3 80.4 80.4  PLT 161 166 204    Cardiac Enzymes:  Recent Labs Lab 06/06/14 2024 06/07/14 0236 06/07/14 0834  TROPONINI <0.30 <0.30 <0.30      Medications:   Scheduled  Medications: . diltiazem  360 mg Oral Daily  . divalproex  500 mg Oral QHS  . DULoxetine  60 mg Oral BID  . enoxaparin (LOVENOX) injection  40 mg Subcutaneous Q24H  . levothyroxine  150 mcg Oral QAC breakfast  . metoprolol succinate  150 mg Oral Daily  . pantoprazole  40 mg Oral Daily  . potassium chloride SA  40 mEq Oral BID  . rOPINIRole  2 mg Oral QHS  . sodium chloride  3 mL Intravenous Q12H      PRN Medications: acetaminophen, acetaminophen, alum & mag hydroxide-simeth, butalbital-acetaminophen-caffeine, docusate sodium, LORazepam, morphine injection, nitroGLYCERIN, ondansetron (ZOFRAN) IV, sodium chloride, technetium sestamibi generic, tiZANidine   Assessment and Plan:   1. Chest Pain: Currently pain free, but has generalized fatigue. Troponin negative X 3. Stress test completed, with non-specific T-wave abnormalities. She did not have pain during stress. Awaiting scintigraphy for further recommendations.   2. Atrial fib: Heart rate is not well controlled prior to stress test. Metoprolol was held this am prior to stress test. Will adjust if necessary after she takes medication this am. No a candidate for anticoagulation secondary to history of ulcers.   Phill Myron. Lawrence NP  06/09/2014, 8:51 AM  Attending Note  Patient seen and discussed with NP Purcell Nails, agree with documentation above. She has history of atypical chest pain. Echo here shows LVEF 55-60% with no WMAs. Stress test normal without evidence of ischemia or scar. Chest pain appears to be noncardiac. No further cardiac workup planned at this time. Has had some elevated rates with her afib that are asymptomatic, increased her Toprol XL to 150mg  daily, continue current dose and can consider further titration if needed as outpatient. Noted episodes of wide complex tachycardia overnight, no sustained episodes, they were asymptomatic. On review there is some R-R interval ireggularity to suggest afib with aberrancy as opposed to  NSVT. There is no evdience of structural or ischemic heart disease, continue increased beta blocker therapy. Mg also noted to be low at 1.5, this has also been replaced. Would discharge on oral Mg supplement. F/u with NP Purcell Nails in 2 weeks. Will sign off of inpatient care.    Zandra Abts MD

## 2014-06-09 NOTE — Progress Notes (Addendum)
Patient ID: Samantha Odonnell  female  DXA:[id number]    DOB: October 21, 1945    DOA: 06/06/2014  PCP: Curlene Labrum, MD  Assessment/Plan: Principal Problem:   Chest pain: Currently improved - a cath in 2008 with normal coronaries in [place], [place],  low risk ETT myoview 09/2012, small partially reversible anterior defect possibly due to attenuation. 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, continue Synthroid    Mitral regurgitation - 2-D echo 8/30 showed EF of 55-60%, mild mitral regurgitation    Hypertension - Continue Cardizem and Toprol  DVT Prophylaxis: Lovenox  Code Status: Full code  Family Communication:  Disposition: Await stress test results  Consultants:  Cardiology  Procedures:  Nuclear medicine stress test  Antibiotics:  None    Subjective: Patient seen after the stress test, feeling nausea, states that she threw up x1 during the stress test  Objective: Weight change:   Intake/Output Summary (Last 24 hours) at 06/09/14 0936 Last data filed at 06/09/14 0500  Gross per 24 hour  Intake   1260 ml  Output      0 ml  Net   1260 ml   Blood pressure 110/75, pulse 98, temperature 98.2 F (36.8 C), temperature source Oral, resp. rate 18, height 5' 1.25" (1.556 m), weight 71.8 kg (158 lb 4.6 oz), SpO2 96.00%.  Physical Exam: General: A x O x3, NAD. CVS: Irregularly irregular Chest: CTAB Abdomen: soft NT, ND, NBS  Extremities: no c/c/e noted bilaterally   Lab  Results: Basic Metabolic Panel:  Recent Labs Lab 06/07/14 0236 06/08/14 0641 06/09/14 0606  NA 133* 136*  --   K 3.9 4.0  --   CL 94* 94*  --   CO2 25 27  --   GLUCOSE 99 101*  --   BUN 12 10  --   CREATININE 0.60 0.56  --   CALCIUM 7.2* 7.0*  --   MG  --   --  1.5   Liver Function Tests: No results found for this basename: AST, ALT, ALKPHOS, BILITOT, PROT, ALBUMIN,  in the last 168 hours No results found for this basename: LIPASE, AMYLASE,  in the last 168 hours No results found for this basename: AMMONIA,  in the last 168 hours CBC:  Recent Labs Lab 06/06/14 1426 06/07/14 0236 06/08/14 0641  WBC 4.6 4.6 4.1  NEUTROABS 2.5  --   --   HGB 10.9* 10.9* 12.7  HCT 33.0* 32.4* 37.8  MCV 80.3 80.4 80.4  PLT 161 166 204   Cardiac Enzymes:  Recent Labs Lab 06/06/14 2024 06/07/14 0236 06/07/14 0834  TROPONINI <0.30 <0.30 <0.30   BNP: No components found with this basename: POCBNP,  CBG: No results found for this basename: GLUCAP,  in the last 168 hours   Micro Results: No results found for this or any previous visit (from the past 240 hour(s)).  Studies/Results: Dg Chest Port 1 View  06/06/2014   CLINICAL DATA:  Chest  pain.  EXAM: PORTABLE CHEST - 1 VIEW  COMPARISON:  None.  FINDINGS: There is hyperinflation of the lungs compatible with COPD. Heart is borderline in size. Left central line tip is at the cavoatrial junction. Suspect hiatal hernia. Bibasilar densities likely reflect atelectasis. No effusions. No acute bony abnormality.  IMPRESSION: COPD.  Bibasilar atelectasis.   Electronically Signed   By: Rolm Baptise M.D.   On: 06/06/2014 14:53    Medications: Scheduled Meds: . diltiazem  360 mg Oral Daily  . divalproex  500 mg Oral QHS  . DULoxetine  60 mg Oral BID  . enoxaparin (LOVENOX) injection  40 mg Subcutaneous Q24H  . levothyroxine  150 mcg Oral QAC breakfast  . metoprolol succinate  150 mg Oral Daily  . pantoprazole  40 mg Oral Daily  . potassium  chloride SA  40 mEq Oral BID  . rOPINIRole  2 mg Oral QHS  . sodium chloride  3 mL Intravenous Q12H      LOS: 3 days   RAI,RIPUDEEP M.D. [facility] 06/09/2014, 9:36 AM Pager: [phone]  If 7PM-7AM, please contact night-coverage [email or web address] Password [email or web address]  **Disclaimer: This note was dictated with voice recognition software. Similar sounding words can inadvertently be transcribed and this note may contain transcription errors which may not have been corrected upon publication of note.**

## 2014-06-09 NOTE — Progress Notes (Signed)
Stress Lab Nurses Notes - [name]  Samantha Odonnell 06/09/2014 Reason for doing test: Chest Pain and AFib Type of test: Lexoscan Cardiolite/ Inpatient Rm 329 Nurse performing test: Gerrit Halls, RN Nuclear Medicine Tech: Redmond Baseman Echo Tech: Not Applicable MD performing test: Branch/K.Lawrence NP Family MD: Ripudeep Test explained and consent signed: Yes.   IV started: portacath in progress from floor Symptoms: nausea & headache Treatment/Intervention: None Reason test stopped: protocol completed After recovery IV was: portacath intack, dressing dry Patient to return to Nuc. Med at :9:00 Patient discharged: Transported back to room 329 via wc Patient's Condition upon discharge was: stable Comments: During test BP 92/70 & HR 134.  Recovery BP 104/82 & HR 118.  Symptoms resolved in recovery.  [name] T

## 2014-06-10 ENCOUNTER — Ambulatory Visit (HOSPITAL_COMMUNITY): Payer: Medicare Other

## 2014-06-10 ENCOUNTER — Encounter (HOSPITAL_COMMUNITY): Payer: Medicare Other

## 2014-06-18 DIAGNOSIS — I4891 Unspecified atrial fibrillation: Secondary | ICD-10-CM | POA: Diagnosis not present

## 2014-06-18 DIAGNOSIS — R6889 Other general symptoms and signs: Secondary | ICD-10-CM | POA: Diagnosis not present

## 2014-06-18 DIAGNOSIS — R0989 Other specified symptoms and signs involving the circulatory and respiratory systems: Secondary | ICD-10-CM | POA: Diagnosis not present

## 2014-06-18 DIAGNOSIS — R1110 Vomiting, unspecified: Secondary | ICD-10-CM | POA: Diagnosis not present

## 2014-06-18 DIAGNOSIS — E876 Hypokalemia: Secondary | ICD-10-CM | POA: Diagnosis not present

## 2014-06-18 DIAGNOSIS — D509 Iron deficiency anemia, unspecified: Secondary | ICD-10-CM | POA: Diagnosis not present

## 2014-06-18 DIAGNOSIS — G8929 Other chronic pain: Secondary | ICD-10-CM | POA: Diagnosis not present

## 2014-06-18 DIAGNOSIS — R0789 Other chest pain: Secondary | ICD-10-CM | POA: Diagnosis not present

## 2014-06-18 DIAGNOSIS — F329 Major depressive disorder, single episode, unspecified: Secondary | ICD-10-CM | POA: Diagnosis not present

## 2014-06-18 DIAGNOSIS — E039 Hypothyroidism, unspecified: Secondary | ICD-10-CM | POA: Diagnosis not present

## 2014-06-18 DIAGNOSIS — Z79899 Other long term (current) drug therapy: Secondary | ICD-10-CM | POA: Diagnosis not present

## 2014-06-18 DIAGNOSIS — Z882 Allergy status to sulfonamides status: Secondary | ICD-10-CM | POA: Diagnosis not present

## 2014-06-18 DIAGNOSIS — G2581 Restless legs syndrome: Secondary | ICD-10-CM | POA: Diagnosis not present

## 2014-06-18 DIAGNOSIS — F411 Generalized anxiety disorder: Secondary | ICD-10-CM | POA: Diagnosis not present

## 2014-06-18 DIAGNOSIS — E785 Hyperlipidemia, unspecified: Secondary | ICD-10-CM | POA: Diagnosis not present

## 2014-06-18 DIAGNOSIS — R079 Chest pain, unspecified: Secondary | ICD-10-CM | POA: Diagnosis not present

## 2014-06-18 DIAGNOSIS — I10 Essential (primary) hypertension: Secondary | ICD-10-CM | POA: Diagnosis not present

## 2014-06-18 DIAGNOSIS — M549 Dorsalgia, unspecified: Secondary | ICD-10-CM | POA: Diagnosis not present

## 2014-06-18 DIAGNOSIS — Z888 Allergy status to other drugs, medicaments and biological substances status: Secondary | ICD-10-CM | POA: Diagnosis not present

## 2014-06-18 DIAGNOSIS — G40909 Epilepsy, unspecified, not intractable, without status epilepticus: Secondary | ICD-10-CM | POA: Diagnosis not present

## 2014-06-18 DIAGNOSIS — F3289 Other specified depressive episodes: Secondary | ICD-10-CM | POA: Diagnosis not present

## 2014-06-18 DIAGNOSIS — Z881 Allergy status to other antibiotic agents status: Secondary | ICD-10-CM | POA: Diagnosis not present

## 2014-06-18 DIAGNOSIS — J9819 Other pulmonary collapse: Secondary | ICD-10-CM | POA: Diagnosis not present

## 2014-06-18 DIAGNOSIS — Z885 Allergy status to narcotic agent status: Secondary | ICD-10-CM | POA: Diagnosis not present

## 2014-06-19 DIAGNOSIS — R079 Chest pain, unspecified: Secondary | ICD-10-CM | POA: Diagnosis not present

## 2014-06-23 ENCOUNTER — Encounter: Payer: Medicare Other | Admitting: Adult Health

## 2014-06-23 NOTE — Progress Notes (Signed)
    Error, Pt cancelled appt.

## 2014-06-30 DIAGNOSIS — F329 Major depressive disorder, single episode, unspecified: Secondary | ICD-10-CM | POA: Diagnosis not present

## 2014-06-30 DIAGNOSIS — K279 Peptic ulcer, site unspecified, unspecified as acute or chronic, without hemorrhage or perforation: Secondary | ICD-10-CM | POA: Diagnosis not present

## 2014-06-30 DIAGNOSIS — I959 Hypotension, unspecified: Secondary | ICD-10-CM | POA: Diagnosis not present

## 2014-06-30 DIAGNOSIS — I4891 Unspecified atrial fibrillation: Secondary | ICD-10-CM | POA: Diagnosis not present

## 2014-06-30 DIAGNOSIS — I5032 Chronic diastolic (congestive) heart failure: Secondary | ICD-10-CM | POA: Diagnosis not present

## 2014-06-30 DIAGNOSIS — K219 Gastro-esophageal reflux disease without esophagitis: Secondary | ICD-10-CM | POA: Diagnosis not present

## 2014-06-30 DIAGNOSIS — G40319 Generalized idiopathic epilepsy and epileptic syndromes, intractable, without status epilepticus: Secondary | ICD-10-CM | POA: Diagnosis not present

## 2014-06-30 DIAGNOSIS — D649 Anemia, unspecified: Secondary | ICD-10-CM | POA: Diagnosis not present

## 2014-07-02 ENCOUNTER — Encounter (HOSPITAL_COMMUNITY): Payer: Self-pay | Admitting: Emergency Medicine

## 2014-07-02 ENCOUNTER — Emergency Department (HOSPITAL_COMMUNITY)
Admission: EM | Admit: 2014-07-02 | Discharge: 2014-07-03 | Disposition: A | Discharge status: Home / Self Care | Payer: Medicare Other | Attending: Family Medicine | Admitting: Family Medicine

## 2014-07-02 DIAGNOSIS — K219 Gastro-esophageal reflux disease without esophagitis: Secondary | ICD-10-CM | POA: Diagnosis not present

## 2014-07-02 DIAGNOSIS — Z85850 Personal history of malignant neoplasm of thyroid: Secondary | ICD-10-CM | POA: Insufficient documentation

## 2014-07-02 DIAGNOSIS — I10 Essential (primary) hypertension: Secondary | ICD-10-CM | POA: Insufficient documentation

## 2014-07-02 DIAGNOSIS — E876 Hypokalemia: Secondary | ICD-10-CM | POA: Insufficient documentation

## 2014-07-02 DIAGNOSIS — R51 Headache: Secondary | ICD-10-CM | POA: Diagnosis not present

## 2014-07-02 DIAGNOSIS — D649 Anemia, unspecified: Secondary | ICD-10-CM | POA: Diagnosis not present

## 2014-07-02 DIAGNOSIS — I5032 Chronic diastolic (congestive) heart failure: Secondary | ICD-10-CM | POA: Diagnosis not present

## 2014-07-02 DIAGNOSIS — R5381 Other malaise: Secondary | ICD-10-CM | POA: Diagnosis not present

## 2014-07-02 DIAGNOSIS — E039 Hypothyroidism, unspecified: Secondary | ICD-10-CM | POA: Insufficient documentation

## 2014-07-02 DIAGNOSIS — F411 Generalized anxiety disorder: Secondary | ICD-10-CM | POA: Insufficient documentation

## 2014-07-02 DIAGNOSIS — Z853 Personal history of malignant neoplasm of breast: Secondary | ICD-10-CM | POA: Diagnosis not present

## 2014-07-02 DIAGNOSIS — I4891 Unspecified atrial fibrillation: Secondary | ICD-10-CM | POA: Insufficient documentation

## 2014-07-02 DIAGNOSIS — F0150 Vascular dementia without behavioral disturbance: Secondary | ICD-10-CM | POA: Insufficient documentation

## 2014-07-02 DIAGNOSIS — R040 Epistaxis: Secondary | ICD-10-CM | POA: Insufficient documentation

## 2014-07-02 DIAGNOSIS — I959 Hypotension, unspecified: Secondary | ICD-10-CM

## 2014-07-02 DIAGNOSIS — H9209 Otalgia, unspecified ear: Secondary | ICD-10-CM | POA: Insufficient documentation

## 2014-07-02 DIAGNOSIS — G4700 Insomnia, unspecified: Secondary | ICD-10-CM | POA: Insufficient documentation

## 2014-07-02 DIAGNOSIS — G8929 Other chronic pain: Secondary | ICD-10-CM | POA: Diagnosis not present

## 2014-07-02 DIAGNOSIS — Z79899 Other long term (current) drug therapy: Secondary | ICD-10-CM | POA: Diagnosis not present

## 2014-07-02 DIAGNOSIS — G40909 Epilepsy, unspecified, not intractable, without status epilepticus: Secondary | ICD-10-CM | POA: Insufficient documentation

## 2014-07-02 DIAGNOSIS — Z8542 Personal history of malignant neoplasm of other parts of uterus: Secondary | ICD-10-CM | POA: Insufficient documentation

## 2014-07-02 DIAGNOSIS — R55 Syncope and collapse: Secondary | ICD-10-CM | POA: Insufficient documentation

## 2014-07-02 DIAGNOSIS — E119 Type 2 diabetes mellitus without complications: Secondary | ICD-10-CM | POA: Diagnosis not present

## 2014-07-02 DIAGNOSIS — R404 Transient alteration of awareness: Secondary | ICD-10-CM | POA: Insufficient documentation

## 2014-07-02 DIAGNOSIS — E785 Hyperlipidemia, unspecified: Secondary | ICD-10-CM | POA: Insufficient documentation

## 2014-07-02 DIAGNOSIS — E209 Hypoparathyroidism, unspecified: Secondary | ICD-10-CM | POA: Insufficient documentation

## 2014-07-02 DIAGNOSIS — K279 Peptic ulcer, site unspecified, unspecified as acute or chronic, without hemorrhage or perforation: Secondary | ICD-10-CM | POA: Diagnosis not present

## 2014-07-02 DIAGNOSIS — R5383 Other fatigue: Secondary | ICD-10-CM | POA: Diagnosis not present

## 2014-07-02 DIAGNOSIS — R112 Nausea with vomiting, unspecified: Secondary | ICD-10-CM | POA: Insufficient documentation

## 2014-07-02 DIAGNOSIS — R519 Headache, unspecified: Secondary | ICD-10-CM

## 2014-07-02 DIAGNOSIS — I509 Heart failure, unspecified: Secondary | ICD-10-CM | POA: Diagnosis not present

## 2014-07-02 LAB — CBC WITH DIFFERENTIAL/PLATELET
BASOS ABS: 0 10*3/uL (ref 0.0–0.1)
Basophils Relative: 0 % (ref 0–1)
EOS ABS: 0.5 10*3/uL (ref 0.0–0.7)
Eosinophils Relative: 7 % — ABNORMAL HIGH (ref 0–5)
HEMATOCRIT: 30.4 % — AB (ref 36.0–46.0)
Hemoglobin: 10.2 g/dL — ABNORMAL LOW (ref 12.0–15.0)
LYMPHS ABS: 1.8 10*3/uL (ref 0.7–4.0)
LYMPHS PCT: 26 % (ref 12–46)
MCH: 28.7 pg (ref 26.0–34.0)
MCHC: 33.6 g/dL (ref 30.0–36.0)
MCV: 85.6 fL (ref 78.0–100.0)
Monocytes Absolute: 0.6 10*3/uL (ref 0.1–1.0)
Monocytes Relative: 8 % (ref 3–12)
NEUTROS ABS: 4.2 10*3/uL (ref 1.7–7.7)
Neutrophils Relative %: 59 % (ref 43–77)
Platelets: 190 10*3/uL (ref 150–400)
RBC: 3.55 MIL/uL — ABNORMAL LOW (ref 3.87–5.11)
RDW: 24.2 % — AB (ref 11.5–15.5)
WBC: 7.1 10*3/uL (ref 4.0–10.5)

## 2014-07-02 LAB — COMPREHENSIVE METABOLIC PANEL
ALT: 19 U/L (ref 0–35)
ANION GAP: 11 (ref 5–15)
AST: 25 U/L (ref 0–37)
Albumin: 3.1 g/dL — ABNORMAL LOW (ref 3.5–5.2)
Alkaline Phosphatase: 67 U/L (ref 39–117)
BUN: 16 mg/dL (ref 6–23)
CALCIUM: 7.6 mg/dL — AB (ref 8.4–10.5)
CO2: 23 mEq/L (ref 19–32)
Chloride: 104 mEq/L (ref 96–112)
Creatinine, Ser: 0.78 mg/dL (ref 0.50–1.10)
GFR calc non Af Amer: 83 mL/min — ABNORMAL LOW (ref >90)
GLUCOSE: 105 mg/dL — AB (ref 70–99)
Potassium: 3.7 mEq/L (ref 3.7–5.3)
Sodium: 138 mEq/L (ref 137–147)
TOTAL PROTEIN: 6 g/dL (ref 6.0–8.3)
Total Bilirubin: <0.2 mg/dL — ABNORMAL LOW (ref 0.3–1.2)

## 2014-07-02 LAB — TROPONIN I: Troponin I: <0.3 ng/mL (ref <0.30)

## 2014-07-02 LAB — VALPROIC ACID LEVEL

## 2014-07-02 MED ORDER — DIPHENHYDRAMINE HCL 50 MG/ML IJ SOLN
25.0000 mg | Freq: Once | INTRAMUSCULAR | Status: AC
  Administered 2014-07-02: 25 mg via INTRAVENOUS
  Filled 2014-07-02: qty 1

## 2014-07-02 MED ORDER — METOCLOPRAMIDE HCL 5 MG/ML IJ SOLN
10.0000 mg | Freq: Once | INTRAMUSCULAR | Status: AC
  Administered 2014-07-02: 10 mg via INTRAVENOUS
  Filled 2014-07-02: qty 2

## 2014-07-02 MED ORDER — METHYLPREDNISOLONE SODIUM SUCC 125 MG IJ SOLR
125.0000 mg | Freq: Once | INTRAMUSCULAR | Status: AC
  Administered 2014-07-02: 125 mg via INTRAVENOUS
  Filled 2014-07-02: qty 2

## 2014-07-02 MED ORDER — SODIUM CHLORIDE 0.9 % IV SOLN
1000.0000 mL | INTRAVENOUS | Status: DC
  Administered 2014-07-02: 1000 mL via INTRAVENOUS

## 2014-07-02 MED ORDER — SODIUM CHLORIDE 0.9 % IV SOLN
1000.0000 mL | Freq: Once | INTRAVENOUS | Status: AC
  Administered 2014-07-02: 1000 mL via INTRAVENOUS

## 2014-07-02 NOTE — ED Notes (Signed)
Pt. Reports multiple nose bleeds "for several days". Pt. Reports having seizure earlier tonight witnessed by a friend in the living community. Pt. Does not appear to be post-ictal. Pt. Alert and oriented x4. Speech clear.

## 2014-07-02 NOTE — ED Provider Notes (Signed)
CSN: [id number]     Arrival date & time 07/02/14  2113 History  This chart was scribed for Janice Norrie, MD by Edison Simon, ED Scribe. This patient was seen in room APA18/APA18 and the patient's care was started at 10:20 PM.    Chief Complaint  Patient presents with  . Loss of Consciousness  . Seizures   The history is provided by the patient. No language interpreter was used.    HPI Comments: Samantha Odonnell is a 68 y.o. female with history of seizures who presents to the Emergency Department complaining of seizure and LOC earlier today, witnessed by a friend at her home. She states her friend told her she was in her bed and when she tried to get up in a sitting position  she passed out and almost fell out of her bed and was laying down when she started seizing. The patient states prior to LOC, she felt like she was going to pass out, similar to when she cannot make it from the chair to the bed which has been happgning since returning from the hospital and rehab. The patient reports she was feeling bad before that: tired and with low blood pressure measured at 69/38. She states her blood pressure usually runs around 120/69; she states her blood pressure has been low since spending 6 weeks at [facility] in July and August of which she has almost no recollection. She states she was told that she has atrial fibrillation. She states she is not on a blood thinner. She reports use of ASA prior to hospitalization but states they stopped her ASA use.   She states she is feeling itchy and foggy at this time. She reports having fatigue and nosebleeds earlier today. She reports a headache to her frontal area and right side and right ear pain with onset 3 weeks ago; she states she spoke with her PCP who sent her oral Imitrex that improved symptoms somewhat, but did not make them go away.  She states he sent a lesser amount than usual and states the pharmacist would not give her a proper amount. She also  reports associated nausea and vomiting, 6 times today, with onset some time ago. She reports using Compazine suppositories for her headache and nausea that improved the nausea but not the headache. She states she has been unable to keep liquids down today while she is usually able to drink liquids. She denies diarrhea.  She states she has lived for 2 years at [facility], an independent living facility. She states her husband lives in [place] and lives here because her daughter lives here.  PCP Dr Pleas Koch  Past Medical History  Diagnosis Date  . Vascular dementia, uncomplicated   . Unspecified epilepsy without mention of intractable epilepsy   . Tachycardia, unspecified   . Hypoparathyroidism   . Malignant neoplasm of thyroid gland   . Personal history of malignant neoplasm of breast   . Unspecified hypothyroidism   . Esophageal reflux   . Unspecified urinary incontinence   . Insomnia, unspecified   . Migraine   . Anxiety state, unspecified   . Hypopotassemia   . Wolff-Parkinson-White (WPW) syndrome   . Mitral regurgitation     Moderate, echo, 01/2012  . HLD (hyperlipidemia)   . Uterine cancer   . Breast cancer   . Thyroid cancer   . Atrial fibrillation     no anticoagulation due to hx GI bleeding   Past Surgical History  Procedure  Laterality Date  . Abdominal hysterectomy    . Neck surgery     Family History  Problem Relation Age of Onset  . Cancer Mother   . Hyperlipidemia Father   . Cancer Father    History  Substance Use Topics  . Smoking status: Never Smoker   . Smokeless tobacco: Never Used  . Alcohol Use: No   Lives in independent living  OB History   Grav Para Term Preterm Abortions TAB SAB Ect Mult Living                 Review of Systems  Constitutional: Positive for fatigue.  HENT: Positive for ear pain and nosebleeds.   Gastrointestinal: Positive for nausea and vomiting. Negative for diarrhea.  Skin:       Itchy   Neurological: Positive for  seizures, syncope and headaches.  All other systems reviewed and are negative.     Allergies  Adhesive; Asa; Cyclobenzaprine; Imdur; Iodine; Pentazocine; Propoxyphene; Septra; Sulfa antibiotics; Tegretol; and Tetracyclines & related  Home Medications   Prior to Admission medications   Medication Sig Start Date End Date Taking? Authorizing Provider  acetaminophen (TYLENOL) 500 MG tablet Take 500 mg by mouth every 6 (six) hours as needed for mild pain or moderate pain.   Yes Historical Provider, MD  calcitRIOL (ROCALTROL) 0.25 MCG capsule Take 0.25 mcg by mouth 2 (two) times daily.   Yes Historical Provider, MD  Cholecalciferol (VITAMIN D) 2000 UNITS CAPS Take 1 capsule by mouth every evening.    Yes Historical Provider, MD  diltiazem (TIAZAC) 360 MG 24 hr capsule Take 360 mg by mouth every morning. For A.FIB   Yes Historical Provider, MD  divalproex (DEPAKOTE ER) 500 MG 24 hr tablet Take 500 mg by mouth at bedtime.   Yes Historical Provider, MD  DULoxetine (CYMBALTA) 60 MG capsule Take 60 mg by mouth 2 (two) times daily.   Yes Historical Provider, MD  esomeprazole (NEXIUM) 40 MG capsule Take 40 mg by mouth every morning.    Yes Historical Provider, MD  ferrous sulfate 325 (65 FE) MG tablet Take 325 mg by mouth 2 (two) times daily.   Yes Historical Provider, MD  levothyroxine (SYNTHROID, LEVOTHROID) 150 MCG tablet Take 150 mcg by mouth daily before breakfast.   Yes Historical Provider, MD  Magnesium 250 MG TABS Take 1 tablet by mouth every morning.   Yes Historical Provider, MD  metoprolol succinate (TOPROL-XL) 50 MG 24 hr tablet Take 150 mg by mouth every morning. Take with or immediately following a meal.   Yes Historical Provider, MD  Multiple Vitamin (MULTIVITAMIN) tablet Take 1 tablet by mouth every evening.    Yes Historical Provider, MD  nitroGLYCERIN (NITROSTAT) 0.4 MG SL tablet Place 0.4 mg under the tongue every 5 (five) minutes as needed for chest pain.   Yes Historical Provider, MD   potassium chloride SA (K-DUR,KLOR-CON) 20 MEQ tablet Take 40 mEq by mouth 2 (two) times daily.   Yes Historical Provider, MD  rOPINIRole (REQUIP) 2 MG tablet Take 2 mg by mouth at bedtime.    Yes Historical Provider, MD  SUMAtriptan (IMITREX) 25 MG tablet Take 25 mg by mouth every 2 (two) hours as needed for migraine (*Wait two hours after 1st dose and take two additional tablets. If no relief take one tablet on a different day as needed for migrianes per patient).  06/27/14  Yes Historical Provider, MD  tiZANidine (ZANAFLEX) 4 MG tablet Take 4 mg by mouth every  6 (six) hours as needed for muscle spasms.    Yes Historical Provider, MD  traZODone (DESYREL) 50 MG tablet Take 50 mg by mouth at bedtime. 06/22/14  Yes Historical Provider, MD  calcium carbonate (TUMS - DOSED IN MG ELEMENTAL CALCIUM) 500 MG chewable tablet Chew 1 tablet by mouth 3 (three) times daily.    Historical Provider, MD   ED Triage Vitals  Enc Vitals Group     BP 07/02/14 2115 86/56 mmHg     Pulse Rate 07/02/14 2115 61     Resp 07/02/14 2115 16     Temp 07/02/14 2115 97.7 F (36.5 C)     Temp src 07/02/14 2115 Oral     SpO2 07/02/14 2115 93 %     Weight 07/02/14 2115 149 lb (67.586 kg)     Height 07/02/14 2115 5\' 1"  (1.549 m)     Head Cir --      Peak Flow --      Pain Score 07/02/14 2120 8     Pain Loc --      Pain Edu? --      Excl. in [place]? --      Vital signs normal except for hypotension  Physical Exam  Nursing note and vitals reviewed. Constitutional: She is oriented to person, place, and time. She appears well-developed and well-nourished.  Non-toxic appearance. She does not appear ill. No distress.  HENT:  Head: Normocephalic and atraumatic.  Right Ear: External ear normal.  Left Ear: External ear normal.  Nose: Nose normal. No mucosal edema or rhinorrhea.  Mouth/Throat: Oropharynx is clear and moist and mucous membranes are normal. No dental abscesses or uvula swelling.  Right TM not erythematous,  minimal dullness, ear canal normal,   Eyes: Conjunctivae and EOM are normal. Pupils are equal, round, and reactive to light.  Neck: Normal range of motion and full passive range of motion without pain. Neck supple.  Cardiovascular: Normal rate, regular rhythm and normal heart sounds.  Exam reveals no gallop and no friction rub.   No murmur heard. Pulmonary/Chest: Effort normal and breath sounds normal. No respiratory distress. She has no wheezes. She has no rhonchi. She has no rales. She exhibits no tenderness and no crepitus.  Abdominal: Soft. Normal appearance and bowel sounds are normal. She exhibits no distension. There is no tenderness. There is no rebound and no guarding.  Musculoskeletal: Normal range of motion. She exhibits no edema and no tenderness.  Moves all extremities well.   Neurological: She is alert and oriented to person, place, and time. She has normal strength. No cranial nerve deficit.  Skin: Skin is warm, dry and intact. No rash noted. No erythema. No pallor.  Psychiatric: She has a normal mood and affect. Her speech is normal and behavior is normal. Her mood appears not anxious.    ED Course  Procedures (including critical care time)  Medications  0.9 %  sodium chloride infusion (0 mLs Intravenous Stopped 07/03/14 0035)    Followed by  0.9 %  sodium chloride infusion (1,000 mLs Intravenous New Bag/Given 07/02/14 2259)  divalproex (DEPAKOTE) DR tablet 500 mg (not administered)  metoCLOPramide (REGLAN) injection 10 mg (10 mg Intravenous Given 07/02/14 2354)  diphenhydrAMINE (BENADRYL) injection 25 mg (25 mg Intravenous Given 07/02/14 2354)  methylPREDNISolone sodium succinate (SOLU-MEDROL) 125 mg/2 mL injection 125 mg (125 mg Intravenous Given 07/02/14 2355)   Patient was given IV fluids and her hypotension resolved. Patient had no vomiting while in the ED. She  complained of some itching and was wanting a steroid Dosepak. She was given IV steroids for her headache. She was  given IV Reglan and Benadryl for her headache and also for her nausea. At discharge patient is in no distress. Her blood pressure is 585 systolic. Patient states her headache is slightly better but still there. However patient is in no distress. This can be followed up by her PCP.  Labs Review Results for orders placed during the hospital encounter of 07/02/14  CBC WITH DIFFERENTIAL      Result Value Ref Range   WBC 7.1  4.0 - 10.5 K/uL   RBC 3.55 (*) 3.87 - 5.11 MIL/uL   Hemoglobin 10.2 (*) 12.0 - 15.0 g/dL   HCT 30.4 (*) 36.0 - 46.0 %   MCV 85.6  78.0 - 100.0 fL   MCH 28.7  26.0 - 34.0 pg   MCHC 33.6  30.0 - 36.0 g/dL   RDW 24.2 (*) 11.5 - 15.5 %   Platelets 190  150 - 400 K/uL   Neutrophils Relative % 59  43 - 77 %   Lymphocytes Relative 26  12 - 46 %   Monocytes Relative 8  3 - 12 %   Eosinophils Relative 7 (*) 0 - 5 %   Basophils Relative 0  0 - 1 %   Neutro Abs 4.2  1.7 - 7.7 K/uL   Lymphs Abs 1.8  0.7 - 4.0 K/uL   Monocytes Absolute 0.6  0.1 - 1.0 K/uL   Eosinophils Absolute 0.5  0.0 - 0.7 K/uL   Basophils Absolute 0.0  0.0 - 0.1 K/uL   RBC Morphology ELLIPTOCYTES     WBC Morphology TOXIC GRANULATION    COMPREHENSIVE METABOLIC PANEL      Result Value Ref Range   Sodium 138  137 - 147 mEq/L   Potassium 3.7  3.7 - 5.3 mEq/L   Chloride 104  96 - 112 mEq/L   CO2 23  19 - 32 mEq/L   Glucose, Bld 105 (*) 70 - 99 mg/dL   BUN 16  6 - 23 mg/dL   Creatinine, Ser 0.78  0.50 - 1.10 mg/dL   Calcium 7.6 (*) 8.4 - 10.5 mg/dL   Total Protein 6.0  6.0 - 8.3 g/dL   Albumin 3.1 (*) 3.5 - 5.2 g/dL   AST 25  0 - 37 U/L   ALT 19  0 - 35 U/L   Alkaline Phosphatase 67  39 - 117 U/L   Total Bilirubin <0.2 (*) 0.3 - 1.2 mg/dL   GFR calc non Af Amer 83 (*) >90 mL/min   GFR calc Af Amer >90  >90 mL/min   Anion gap 11  5 - 15  TROPONIN I      Result Value Ref Range   Troponin I <0.30  <0.30 ng/mL  URINALYSIS, ROUTINE W REFLEX MICROSCOPIC      Result Value Ref Range   Color, Urine YELLOW   YELLOW   APPearance CLEAR  CLEAR   Specific Gravity, Urine 1.025  1.005 - 1.030   pH 5.5  5.0 - 8.0   Glucose, UA NEGATIVE  NEGATIVE mg/dL   Hgb urine dipstick NEGATIVE  NEGATIVE   Bilirubin Urine NEGATIVE  NEGATIVE   Ketones, ur TRACE (*) NEGATIVE mg/dL   Protein, ur NEGATIVE  NEGATIVE mg/dL   Urobilinogen, UA 0.2  0.0 - 1.0 mg/dL   Nitrite NEGATIVE  NEGATIVE   Leukocytes, UA NEGATIVE  NEGATIVE  VALPROIC ACID  LEVEL      Result Value Ref Range   Valproic Acid Lvl <10.0 (*) 50.0 - 100.0 ug/mL   Laboratory interpretation all normal except anemia, low calcium with low albumin    Nm Myocar Multi W/spect W/wall Motion / Ef  06/09/2014   CLINICAL DATA:  68 year old female with no known history of coronary artery disease referred for chest pain.   IMPRESSION: 1. No reversible ischemia or infarction.  2. Normal left ventricular wall motion.  3. Left ventricular ejection fraction 45%, low normal function for MPI  4. Low-risk stress test findings*.  *2012 Appropriate Use Criteria for Coronary Revascularization Focused Update: J Am Coll Cardiol. 9924;26(8):341-962. http://content.airportbarriers.com.aspx?articleid=1201161   Electronically Signed   By: Carlyle Dolly   On: 06/09/2014 12:09   Dg Chest Port 1 View  06/06/2014   CLINICAL DATA:  Chest pain.IMPRESSION: COPD.  Bibasilar atelectasis.   Electronically Signed   By: Rolm Baptise M.D.   On: 06/06/2014 14:53      Imaging Review No results found.   EKG Interpretation   Date/Time:  Thursday July 02 2014 21:22:39 EDT Ventricular Rate:  60 PR Interval:    QRS Duration: 82 QT Interval:  505 QTC Calculation: 505 R Axis:   83 Text Interpretation:  Atrial fibrillation Borderline right axis deviation  Probable anteroseptal infarct, old Prolonged QT interval Since last  tracing 07 Jun 2014 heart rate is slower Confirmed by Azana Kiesler  MD-I, Billiejo Sorto  ([id number]) on 07/02/2014 11:40:18 PM     DIAGNOSTIC STUDIES: Oxygen Saturation is 93% on  nasal canula, adequate by my interpretation.    COORDINATION OF CARE: 10:38 PM Discussed treatment plan with patient at beside, the patient agrees with the plan and has no further questions at this time.    MDM   Final diagnoses:  Nausea and vomiting, vomiting of unspecified type  Hypotension, unspecified hypotension type  Syncope, unspecified syncope type  Seizure disorder  Headache, chronic daily   New Prescriptions   ONDANSETRON (ZOFRAN) 4 MG TABLET    Take 1 tablet (4 mg total) by mouth every 8 (eight) hours as needed for nausea or vomiting.    Plan discharge   Rolland Porter, MD, [place]   CRITICAL CARE Performed by: Rolland Porter L Total critical care time: 36 min Critical care time was exclusive of separately billable procedures and treating other patients. Critical care was necessary to treat or prevent imminent or life-threatening deterioration. Critical care was time spent personally by me on the following activities: development of treatment plan with patient and/or surrogate as well as nursing, discussions with consultants, evaluation of patient's response to treatment, examination of patient, obtaining history from patient or surrogate, ordering and performing treatments and interventions, ordering and review of laboratory studies, ordering and review of radiographic studies, pulse oximetry and re-evaluation of patient's condition.   I personally performed the services described in this documentation, which was scribed in my presence. The recorded information has been reviewed and considered.  Rolland Porter, MD, [facility]    Janice Norrie, MD 07/03/14 0111

## 2014-07-02 NOTE — ED Notes (Signed)
Pt. Receiving liter bolus of NS started by EMS

## 2014-07-02 NOTE — ED Notes (Addendum)
Per EMS syncopal episode tonight witnessed by EMS. Reports that pt. Got out of bed with they arrived on scene and pt. Had syncopal episode but did not hit head on anything. Pt. From independent living community. EMS reports pt. Bradycardic with BP 80/60.

## 2014-07-02 NOTE — ED Notes (Addendum)
Pt. Daughter Mrs. Arbutus Ped called to speak to pt. Nurse. Pt. Stated that she does not want any information given to daughter other than that she is ok. Pt. And alert and oriented x4. Daughter upset and informed that if she wants information regarding the patient's condition she needs to contact the independent living facility or the patient directly. Daughter states that she is healthcare power of attorney. Pt. Is currently of sound mind and is able to make decisions for herself. Daughter hung up the phone mid-conversation.

## 2014-07-02 NOTE — ED Notes (Signed)
Pt. C/o itching. Dr. Tomi Bamberger notified.

## 2014-07-02 NOTE — ED Notes (Signed)
Dr.Knapp at bedside  

## 2014-07-02 NOTE — ED Notes (Signed)
Pt. Son-in-law Burr Medico called to ask about patient. Pt. States that we can tell him that she had a seizure, her blood pressure is low and that we are taking care of her. Remo Lipps given that information. Pt. Does not wish to speak with Remo Lipps.

## 2014-07-02 NOTE — ED Notes (Signed)
Pt reports migraine x3 days, pt reports some relief of headache when she holds her head and when she places ice pack on head.

## 2014-07-03 DIAGNOSIS — R55 Syncope and collapse: Secondary | ICD-10-CM | POA: Diagnosis not present

## 2014-07-03 LAB — URINALYSIS, ROUTINE W REFLEX MICROSCOPIC
Bilirubin Urine: NEGATIVE
Glucose, UA: NEGATIVE mg/dL
Hgb urine dipstick: NEGATIVE
Leukocytes, UA: NEGATIVE
Nitrite: NEGATIVE
Protein, ur: NEGATIVE mg/dL
Specific Gravity, Urine: 1.025 (ref 1.005–1.030)
Urobilinogen, UA: 0.2 mg/dL (ref 0.0–1.0)
pH: 5.5 (ref 5.0–8.0)

## 2014-07-03 MED ORDER — HEPARIN SOD (PORK) LOCK FLUSH 100 UNIT/ML IV SOLN
INTRAVENOUS | Status: AC
  Administered 2014-07-03: 06:00:00
  Filled 2014-07-03: qty 5

## 2014-07-03 MED ORDER — DIVALPROEX SODIUM 250 MG PO DR TAB
500.0000 mg | DELAYED_RELEASE_TABLET | Freq: Once | ORAL | Status: AC
  Administered 2014-07-03: 500 mg via ORAL
  Filled 2014-07-03: qty 2

## 2014-07-03 MED ORDER — ONDANSETRON HCL 4 MG PO TABS: 4.0000 mg | ORAL_TABLET | Freq: Three times a day (TID) | ORAL | Status: AC | PRN

## 2014-07-03 NOTE — Discharge Instructions (Signed)
Drink plenty of fluids. Use the zofran or your compazine suppositories for your nausea. Discuss your headaches with your doctor. Consider having Dr Benjamine Mola recheck your right ear for you, he is an ENT specialist, call his office to get an appointment.

## 2014-07-03 NOTE — ED Notes (Signed)
Pt. Given phone to try to find a ride. [place] contacted by [employer] and they state that they are unable to provide transportation for pt. At this time.

## 2014-07-03 NOTE — ED Notes (Signed)
[place] reports that they can provide transportation for patient in the morning. Pt. Made aware. Will continue to monitor patient through the night. Pt. Resting. No acute distress noted.

## 2014-07-03 NOTE — ED Notes (Signed)
Up to [facility], voided clear yellow urine. .  D/cd Jelco to left permacath.  Am ADL's done.  Only drank fluids from breakfast tray.  Voided clear yellow urine.

## 2014-07-07 DIAGNOSIS — I4891 Unspecified atrial fibrillation: Secondary | ICD-10-CM | POA: Diagnosis not present

## 2014-07-07 DIAGNOSIS — I5032 Chronic diastolic (congestive) heart failure: Secondary | ICD-10-CM | POA: Diagnosis not present

## 2014-07-07 DIAGNOSIS — I959 Hypotension, unspecified: Secondary | ICD-10-CM | POA: Diagnosis not present

## 2014-07-07 DIAGNOSIS — K279 Peptic ulcer, site unspecified, unspecified as acute or chronic, without hemorrhage or perforation: Secondary | ICD-10-CM | POA: Diagnosis not present

## 2014-07-07 DIAGNOSIS — D649 Anemia, unspecified: Secondary | ICD-10-CM | POA: Diagnosis not present

## 2014-07-07 DIAGNOSIS — K219 Gastro-esophageal reflux disease without esophagitis: Secondary | ICD-10-CM | POA: Diagnosis not present

## 2014-07-08 DIAGNOSIS — K259 Gastric ulcer, unspecified as acute or chronic, without hemorrhage or perforation: Secondary | ICD-10-CM | POA: Diagnosis not present

## 2014-07-08 DIAGNOSIS — E209 Hypoparathyroidism, unspecified: Secondary | ICD-10-CM | POA: Diagnosis not present

## 2014-07-08 DIAGNOSIS — I4891 Unspecified atrial fibrillation: Secondary | ICD-10-CM | POA: Diagnosis not present

## 2014-07-08 DIAGNOSIS — M1990 Unspecified osteoarthritis, unspecified site: Secondary | ICD-10-CM | POA: Diagnosis not present

## 2014-07-08 DIAGNOSIS — D509 Iron deficiency anemia, unspecified: Secondary | ICD-10-CM | POA: Diagnosis not present

## 2014-07-08 DIAGNOSIS — F0150 Vascular dementia without behavioral disturbance: Secondary | ICD-10-CM | POA: Diagnosis not present

## 2014-07-08 DIAGNOSIS — E039 Hypothyroidism, unspecified: Secondary | ICD-10-CM | POA: Diagnosis not present

## 2014-07-08 DIAGNOSIS — G40909 Epilepsy, unspecified, not intractable, without status epilepticus: Secondary | ICD-10-CM | POA: Diagnosis not present

## 2014-07-09 DIAGNOSIS — E039 Hypothyroidism, unspecified: Secondary | ICD-10-CM | POA: Diagnosis not present

## 2014-07-09 DIAGNOSIS — F0151 Vascular dementia with behavioral disturbance: Secondary | ICD-10-CM | POA: Diagnosis not present

## 2014-07-09 DIAGNOSIS — M1990 Unspecified osteoarthritis, unspecified site: Secondary | ICD-10-CM | POA: Diagnosis not present

## 2014-07-09 DIAGNOSIS — G40309 Generalized idiopathic epilepsy and epileptic syndromes, not intractable, without status epilepticus: Secondary | ICD-10-CM | POA: Diagnosis not present

## 2014-07-09 DIAGNOSIS — I482 Chronic atrial fibrillation: Secondary | ICD-10-CM | POA: Diagnosis not present

## 2014-07-09 DIAGNOSIS — D509 Iron deficiency anemia, unspecified: Secondary | ICD-10-CM | POA: Diagnosis not present

## 2014-07-09 DIAGNOSIS — E209 Hypoparathyroidism, unspecified: Secondary | ICD-10-CM | POA: Diagnosis not present

## 2014-07-09 DIAGNOSIS — K259 Gastric ulcer, unspecified as acute or chronic, without hemorrhage or perforation: Secondary | ICD-10-CM | POA: Diagnosis not present

## 2014-07-10 DIAGNOSIS — K219 Gastro-esophageal reflux disease without esophagitis: Secondary | ICD-10-CM | POA: Diagnosis not present

## 2014-07-10 DIAGNOSIS — I4891 Unspecified atrial fibrillation: Secondary | ICD-10-CM | POA: Diagnosis not present

## 2014-07-10 DIAGNOSIS — D649 Anemia, unspecified: Secondary | ICD-10-CM | POA: Diagnosis not present

## 2014-07-10 DIAGNOSIS — I959 Hypotension, unspecified: Secondary | ICD-10-CM | POA: Diagnosis not present

## 2014-07-10 DIAGNOSIS — I5032 Chronic diastolic (congestive) heart failure: Secondary | ICD-10-CM | POA: Diagnosis not present

## 2014-07-10 DIAGNOSIS — K279 Peptic ulcer, site unspecified, unspecified as acute or chronic, without hemorrhage or perforation: Secondary | ICD-10-CM | POA: Diagnosis not present

## 2014-07-14 ENCOUNTER — Ambulatory Visit: Payer: Medicare Other | Admitting: Cardiology

## 2014-07-14 NOTE — Progress Notes (Unsigned)
Patient ID: Samantha Odonnell, female   DOB: [birth year], 68 y.o.   MRN: [id number]      Clinical Summary Ms. Samantha Odonnell is a 68 y.o.female seen today for follow up of the following medical problems.   1. Chest pain  - long history of chest pain according to notes  - obtained some notes from [facility] in 2010, reference chest pain at that time, a cath in 2008 with normal coronaries in [place], [place]  - low risk ETT myoview 09/2012, small partially reversible anterior defect possibly due to attenuation  - echo 09/2012 LVEF 60-65%, moderate MR  - new diagnosis of gastric ulcers, previously taking a lot of NSAIDs. Symptoms thought to be GI, she is being followed by GI   2. Afib  - new diagnosis during recent admission to [facility] with severe anemia, hypothyroidism, and electrolyte abnormalities.. Afib is not mentioned on the discharge summary, however per patient report and review of the EKG it is evident.  - EKG [facility] shows afib rates 150. This was found in the setting of significant thyroid and electrolyte abnormalities.  - denies any palpitations. No significant SOB.  - occasional feeling of palpitations, lasts for 5-10 minutes.  - not on any rate controlling agents, not on anticoag due to history of severe GI bleed  Past Medical History  Diagnosis Date  . Vascular dementia, uncomplicated   . Unspecified epilepsy without mention of intractable epilepsy   . Tachycardia, unspecified   . Hypoparathyroidism   . Malignant neoplasm of thyroid gland   . Personal history of malignant neoplasm of breast   . Unspecified hypothyroidism   . Esophageal reflux   . Unspecified urinary incontinence   . Insomnia, unspecified   . Migraine   . Anxiety state, unspecified   . Hypopotassemia   . Wolff-Parkinson-White (WPW) syndrome   . Mitral regurgitation     Moderate, echo, 01/2012  . HLD (hyperlipidemia)   . Uterine cancer   . Breast cancer   . Thyroid cancer   . Atrial fibrillation     no anticoagulation due to hx GI bleeding     Allergies  Allergen Reactions  . Adhesive [Tape]   . Asa [Aspirin] Other (See Comments)    ulcers  . Cyclobenzaprine   . Imdur [Isosorbide]     Headache, and rash   . Iodine   . Pentazocine   . Propoxyphene   . Septra [Sulfamethoxazole-Trimethoprim]   . Sulfa Antibiotics   . Tegretol [Carbamazepine]   . Tetracyclines & Related      Current Outpatient Prescriptions  Medication Sig Dispense Refill  . acetaminophen (TYLENOL) 500 MG tablet Take 500 mg by mouth every 6 (six) hours as needed for mild pain or moderate pain.      . calcitRIOL (ROCALTROL) 0.25 MCG capsule Take 0.25 mcg by mouth 2 (two) times daily.      . calcium carbonate (TUMS - DOSED IN MG ELEMENTAL CALCIUM) 500 MG chewable tablet Chew 1 tablet by mouth 3 (three) times daily.      . Cholecalciferol (VITAMIN D) 2000 UNITS CAPS Take 1 capsule by mouth every evening.       . diltiazem (TIAZAC) 360 MG 24 hr capsule Take 360 mg by mouth every morning. For A.FIB      . divalproex (DEPAKOTE ER) 500 MG 24 hr tablet Take 500 mg by mouth at bedtime.      . DULoxetine (CYMBALTA) 60 MG capsule Take 60 mg by mouth 2 (  two) times daily.      Marland Kitchen esomeprazole (NEXIUM) 40 MG capsule Take 40 mg by mouth every morning.       . ferrous sulfate 325 (65 FE) MG tablet Take 325 mg by mouth 2 (two) times daily.      Marland Kitchen levothyroxine (SYNTHROID, LEVOTHROID) 150 MCG tablet Take 150 mcg by mouth daily before breakfast.      . Magnesium 250 MG TABS Take 1 tablet by mouth every morning.      . metoprolol succinate (TOPROL-XL) 50 MG 24 hr tablet Take 150 mg by mouth every morning. Take with or immediately following a meal.      . Multiple Vitamin (MULTIVITAMIN) tablet Take 1 tablet by mouth every evening.       . nitroGLYCERIN (NITROSTAT) 0.4 MG SL tablet Place 0.4 mg under the tongue every 5 (five) minutes as needed for chest pain.      Marland Kitchen ondansetron (ZOFRAN) 4 MG tablet Take 1 tablet (4 mg total) by  mouth every 8 (eight) hours as needed for nausea or vomiting.  12 tablet  0  . potassium chloride SA (K-DUR,KLOR-CON) 20 MEQ tablet Take 40 mEq by mouth 2 (two) times daily.      Marland Kitchen rOPINIRole (REQUIP) 2 MG tablet Take 2 mg by mouth at bedtime.       . SUMAtriptan (IMITREX) 25 MG tablet Take 25 mg by mouth every 2 (two) hours as needed for migraine (*Wait two hours after 1st dose and take two additional tablets. If no relief take one tablet on a different day as needed for migrianes per patient).       Marland Kitchen tiZANidine (ZANAFLEX) 4 MG tablet Take 4 mg by mouth every 6 (six) hours as needed for muscle spasms.       . traZODone (DESYREL) 50 MG tablet Take 50 mg by mouth at bedtime.       No current facility-administered medications for this visit.     Past Surgical History  Procedure Laterality Date  . Abdominal hysterectomy    . Neck surgery       Allergies  Allergen Reactions  . Adhesive [Tape]   . Asa [Aspirin] Other (See Comments)    ulcers  . Cyclobenzaprine   . Imdur [Isosorbide]     Headache, and rash   . Iodine   . Pentazocine   . Propoxyphene   . Septra [Sulfamethoxazole-Trimethoprim]   . Sulfa Antibiotics   . Tegretol [Carbamazepine]   . Tetracyclines & Related       Family History  Problem Relation Age of Onset  . Cancer Mother   . Hyperlipidemia Father   . Cancer Father      Social History Samantha Odonnell reports that she has never smoked. She has never used smokeless tobacco. Samantha Odonnell reports that she does not drink alcohol.   Review of Systems CONSTITUTIONAL: No weight loss, fever, chills, weakness or fatigue.  HEENT: Eyes: No visual loss, blurred vision, double vision or yellow sclerae.No hearing loss, sneezing, congestion, runny nose or sore throat.  SKIN: No rash or itching.  CARDIOVASCULAR:  RESPIRATORY: No shortness of breath, cough or sputum.  GASTROINTESTINAL: No anorexia, nausea, vomiting or diarrhea. No abdominal pain or blood.  GENITOURINARY: No  burning on urination, no polyuria NEUROLOGICAL: No headache, dizziness, syncope, paralysis, ataxia, numbness or tingling in the extremities. No change in bowel or bladder control.  MUSCULOSKELETAL: No muscle, back pain, joint pain or stiffness.  LYMPHATICS: No enlarged nodes. No history  of splenectomy.  PSYCHIATRIC: No history of depression or anxiety.  ENDOCRINOLOGIC: No reports of sweating, cold or heat intolerance. No polyuria or polydipsia.  Marland Kitchen   Physical Examination There were no vitals filed for this visit. There were no vitals filed for this visit.  Gen: resting comfortably, no acute distress HEENT: no scleral icterus, pupils equal round and reactive, no palptable cervical adenopathy,  CV Resp: Clear to auscultation bilaterally GI: abdomen is soft, non-tender, non-distended, normal bowel sounds, no hepatosplenomegaly MSK: extremities are warm, no edema.  Skin: warm, no rash Neuro:  no focal deficits Psych: appropriate affect   Diagnostic Studies 09/2012 MPI  Overall Impression: Low risk stress nuclear study with a small, medium intensity, partially reversible anterior/apical defect consistent with soft tissue attenuation and mild ischemia.  LV Ejection Fraction: 68%. LV Wall Motion: NL LV Function; NL Wall Motion  09/2012 Echo  - Left ventricle: The cavity size was normal. Wall thickness was increased in a pattern of mild LVH. Systolic function was normal. The estimated ejection fraction was in the range of 60% to 65%. Wall motion was normal; there were no regional wall motion abnormalities. Findings consistent with left ventricular diastolic dysfunction. - Aortic valve: Trileaflet. Trivial regurgitation. Mean gradient: 55mm Hg (S). - Mitral valve: Calcified annulus. Moderate regurgitation directed eccentrically - best seen in the apical views. Otherwise appears mild. Regurgitant volume: 68ml (PISA). - Left atrium: The atrium was mildly to moderately dilated. - Tricuspid  valve: Mild regurgitation. - Pulmonary arteries: PA peak pressure: 74mm Hg (S). - Pericardium, extracardiac: A prominent pericardial fat pad was present.  Cath 2008 [facility]  Normal coronaies (see media section of epic)     Assessment and Plan  1. Chest pain:  - atypical with low risk myoview recently  - continue risk factor modification  - will requestion her note from GI   2. Afib  - new diagnosis made during recent admission to [facility] in setting of severe anemia, GI bleed, hypothyroidism, and electrolyte abnormalities  - she is not anticoag candidate given her recent severe GI bleed  - describes palpitations at times, will obtain 14 day monitor to see if any afib recurrence.  - in her PMH is listed a history of WPW, I am not sure the details of this. Her reviewed EKGs have PR intervals 130s-140s. She does is not aware of this history, though she is somewhat of a poor historian due to her dementia   3. Mitral regurgitation  - will need repeat echo in future  - no current symptoms.        Arnoldo Lenis, M.D.

## 2014-07-15 DIAGNOSIS — D649 Anemia, unspecified: Secondary | ICD-10-CM | POA: Diagnosis not present

## 2014-07-15 DIAGNOSIS — I4891 Unspecified atrial fibrillation: Secondary | ICD-10-CM | POA: Diagnosis not present

## 2014-07-15 DIAGNOSIS — K279 Peptic ulcer, site unspecified, unspecified as acute or chronic, without hemorrhage or perforation: Secondary | ICD-10-CM | POA: Diagnosis not present

## 2014-07-15 DIAGNOSIS — I5032 Chronic diastolic (congestive) heart failure: Secondary | ICD-10-CM | POA: Diagnosis not present

## 2014-07-15 DIAGNOSIS — I959 Hypotension, unspecified: Secondary | ICD-10-CM | POA: Diagnosis not present

## 2014-07-15 DIAGNOSIS — K219 Gastro-esophageal reflux disease without esophagitis: Secondary | ICD-10-CM | POA: Diagnosis not present

## 2014-07-22 DIAGNOSIS — K219 Gastro-esophageal reflux disease without esophagitis: Secondary | ICD-10-CM | POA: Diagnosis not present

## 2014-07-22 DIAGNOSIS — I4891 Unspecified atrial fibrillation: Secondary | ICD-10-CM | POA: Diagnosis not present

## 2014-07-22 DIAGNOSIS — I5032 Chronic diastolic (congestive) heart failure: Secondary | ICD-10-CM | POA: Diagnosis not present

## 2014-07-22 DIAGNOSIS — K279 Peptic ulcer, site unspecified, unspecified as acute or chronic, without hemorrhage or perforation: Secondary | ICD-10-CM | POA: Diagnosis not present

## 2014-07-22 DIAGNOSIS — I959 Hypotension, unspecified: Secondary | ICD-10-CM | POA: Diagnosis not present

## 2014-07-22 DIAGNOSIS — D649 Anemia, unspecified: Secondary | ICD-10-CM | POA: Diagnosis not present

## 2014-08-01 DIAGNOSIS — Z5181 Encounter for therapeutic drug level monitoring: Secondary | ICD-10-CM | POA: Diagnosis not present

## 2014-08-05 DIAGNOSIS — D649 Anemia, unspecified: Secondary | ICD-10-CM | POA: Diagnosis not present

## 2014-08-05 DIAGNOSIS — I959 Hypotension, unspecified: Secondary | ICD-10-CM | POA: Diagnosis not present

## 2014-08-05 DIAGNOSIS — I4891 Unspecified atrial fibrillation: Secondary | ICD-10-CM | POA: Diagnosis not present

## 2014-08-05 DIAGNOSIS — I5032 Chronic diastolic (congestive) heart failure: Secondary | ICD-10-CM | POA: Diagnosis not present

## 2014-08-05 DIAGNOSIS — K279 Peptic ulcer, site unspecified, unspecified as acute or chronic, without hemorrhage or perforation: Secondary | ICD-10-CM | POA: Diagnosis not present

## 2014-08-05 DIAGNOSIS — K219 Gastro-esophageal reflux disease without esophagitis: Secondary | ICD-10-CM | POA: Diagnosis not present

## 2014-08-08 DIAGNOSIS — R300 Dysuria: Secondary | ICD-10-CM | POA: Diagnosis not present

## 2014-08-18 DIAGNOSIS — G43919 Migraine, unspecified, intractable, without status migrainosus: Secondary | ICD-10-CM | POA: Diagnosis not present

## 2014-08-18 DIAGNOSIS — G40309 Generalized idiopathic epilepsy and epileptic syndromes, not intractable, without status epilepticus: Secondary | ICD-10-CM | POA: Diagnosis not present

## 2014-08-18 DIAGNOSIS — D509 Iron deficiency anemia, unspecified: Secondary | ICD-10-CM | POA: Diagnosis not present

## 2014-08-18 DIAGNOSIS — R21 Rash and other nonspecific skin eruption: Secondary | ICD-10-CM | POA: Diagnosis not present

## 2014-08-18 DIAGNOSIS — K219 Gastro-esophageal reflux disease without esophagitis: Secondary | ICD-10-CM | POA: Diagnosis not present

## 2014-08-18 DIAGNOSIS — M170 Bilateral primary osteoarthritis of knee: Secondary | ICD-10-CM | POA: Diagnosis not present

## 2014-08-18 DIAGNOSIS — Z23 Encounter for immunization: Secondary | ICD-10-CM | POA: Diagnosis not present

## 2014-08-27 DIAGNOSIS — I959 Hypotension, unspecified: Secondary | ICD-10-CM | POA: Diagnosis not present

## 2014-08-27 DIAGNOSIS — K219 Gastro-esophageal reflux disease without esophagitis: Secondary | ICD-10-CM | POA: Diagnosis not present

## 2014-08-27 DIAGNOSIS — I4891 Unspecified atrial fibrillation: Secondary | ICD-10-CM | POA: Diagnosis not present

## 2014-08-27 DIAGNOSIS — D649 Anemia, unspecified: Secondary | ICD-10-CM | POA: Diagnosis not present

## 2014-08-27 DIAGNOSIS — K279 Peptic ulcer, site unspecified, unspecified as acute or chronic, without hemorrhage or perforation: Secondary | ICD-10-CM | POA: Diagnosis not present

## 2014-08-27 DIAGNOSIS — I5032 Chronic diastolic (congestive) heart failure: Secondary | ICD-10-CM | POA: Diagnosis not present

## 2014-09-08 ENCOUNTER — Telehealth: Payer: Self-pay | Admitting: *Deleted

## 2014-09-08 ENCOUNTER — Ambulatory Visit: Payer: Medicare Other | Admitting: Cardiovascular Disease

## 2014-09-08 DIAGNOSIS — D509 Iron deficiency anemia, unspecified: Secondary | ICD-10-CM | POA: Diagnosis not present

## 2014-09-08 DIAGNOSIS — R000 Tachycardia, unspecified: Secondary | ICD-10-CM | POA: Diagnosis not present

## 2014-09-08 DIAGNOSIS — F0151 Vascular dementia with behavioral disturbance: Secondary | ICD-10-CM | POA: Diagnosis not present

## 2014-09-08 DIAGNOSIS — R32 Unspecified urinary incontinence: Secondary | ICD-10-CM | POA: Diagnosis not present

## 2014-09-08 DIAGNOSIS — G40309 Generalized idiopathic epilepsy and epileptic syndromes, not intractable, without status epilepticus: Secondary | ICD-10-CM | POA: Diagnosis not present

## 2014-09-08 DIAGNOSIS — R21 Rash and other nonspecific skin eruption: Secondary | ICD-10-CM | POA: Diagnosis not present

## 2014-09-08 DIAGNOSIS — E039 Hypothyroidism, unspecified: Secondary | ICD-10-CM | POA: Diagnosis not present

## 2014-09-08 DIAGNOSIS — I482 Chronic atrial fibrillation: Secondary | ICD-10-CM | POA: Diagnosis not present

## 2014-09-08 NOTE — Telephone Encounter (Signed)
Per daughter, patient c/o tingling and itching in left arm. BP 130/83 HR 87. Patient has taken 3 nitroglycerin with no relief. No chest pain, sob or dizziness. Nurse advised daughter to contact PCP for an appointment and if they felt it was cardiac related to contact our office for an appointment. Nurse advised daughter that nitroglycerin is to be used for severe chest pain.

## 2014-09-10 NOTE — Telephone Encounter (Signed)
Agreed. No NTG for itching.

## 2014-10-22 DIAGNOSIS — R350 Frequency of micturition: Secondary | ICD-10-CM | POA: Diagnosis not present

## 2015-01-11 ENCOUNTER — Encounter: Payer: Self-pay | Admitting: Cardiology

## 2015-01-11 DIAGNOSIS — Z881 Allergy status to other antibiotic agents status: Secondary | ICD-10-CM | POA: Diagnosis not present

## 2015-01-11 DIAGNOSIS — Z888 Allergy status to other drugs, medicaments and biological substances status: Secondary | ICD-10-CM | POA: Diagnosis not present

## 2015-01-11 DIAGNOSIS — Z85850 Personal history of malignant neoplasm of thyroid: Secondary | ICD-10-CM | POA: Diagnosis not present

## 2015-01-11 DIAGNOSIS — E876 Hypokalemia: Secondary | ICD-10-CM | POA: Diagnosis not present

## 2015-01-11 DIAGNOSIS — Z885 Allergy status to narcotic agent status: Secondary | ICD-10-CM | POA: Diagnosis not present

## 2015-01-11 DIAGNOSIS — R0602 Shortness of breath: Secondary | ICD-10-CM | POA: Diagnosis not present

## 2015-01-11 DIAGNOSIS — E039 Hypothyroidism, unspecified: Secondary | ICD-10-CM | POA: Diagnosis not present

## 2015-01-11 DIAGNOSIS — R079 Chest pain, unspecified: Secondary | ICD-10-CM | POA: Diagnosis not present

## 2015-01-11 DIAGNOSIS — I482 Chronic atrial fibrillation: Secondary | ICD-10-CM | POA: Diagnosis not present

## 2015-01-11 DIAGNOSIS — Z889 Allergy status to unspecified drugs, medicaments and biological substances status: Secondary | ICD-10-CM | POA: Diagnosis not present

## 2015-01-11 DIAGNOSIS — R0789 Other chest pain: Secondary | ICD-10-CM | POA: Diagnosis not present

## 2015-01-11 DIAGNOSIS — Z882 Allergy status to sulfonamides status: Secondary | ICD-10-CM | POA: Diagnosis not present

## 2015-01-11 DIAGNOSIS — Z79899 Other long term (current) drug therapy: Secondary | ICD-10-CM | POA: Diagnosis not present

## 2015-01-12 DIAGNOSIS — R079 Chest pain, unspecified: Secondary | ICD-10-CM | POA: Diagnosis not present

## 2015-01-12 DIAGNOSIS — I4891 Unspecified atrial fibrillation: Secondary | ICD-10-CM | POA: Diagnosis not present

## 2015-01-12 DIAGNOSIS — E039 Hypothyroidism, unspecified: Secondary | ICD-10-CM | POA: Diagnosis not present

## 2015-01-13 DIAGNOSIS — R079 Chest pain, unspecified: Secondary | ICD-10-CM | POA: Diagnosis not present

## 2015-01-13 DIAGNOSIS — I482 Chronic atrial fibrillation: Secondary | ICD-10-CM | POA: Diagnosis not present

## 2015-01-13 DIAGNOSIS — I481 Persistent atrial fibrillation: Secondary | ICD-10-CM | POA: Diagnosis not present

## 2015-01-14 ENCOUNTER — Encounter: Payer: Self-pay | Admitting: Cardiology

## 2015-01-14 ENCOUNTER — Ambulatory Visit (INDEPENDENT_AMBULATORY_CARE_PROVIDER_SITE_OTHER): Payer: Medicare Other | Admitting: Cardiology

## 2015-01-14 ENCOUNTER — Encounter: Payer: Self-pay | Admitting: *Deleted

## 2015-01-14 VITALS — BP 118/70 | HR 85 | Ht 60.0 in | Wt 141.8 lb

## 2015-01-14 DIAGNOSIS — E039 Hypothyroidism, unspecified: Secondary | ICD-10-CM | POA: Diagnosis not present

## 2015-01-14 DIAGNOSIS — R0789 Other chest pain: Secondary | ICD-10-CM | POA: Diagnosis not present

## 2015-01-14 DIAGNOSIS — I4891 Unspecified atrial fibrillation: Secondary | ICD-10-CM

## 2015-01-14 DIAGNOSIS — G40909 Epilepsy, unspecified, not intractable, without status epilepticus: Secondary | ICD-10-CM | POA: Diagnosis not present

## 2015-01-14 DIAGNOSIS — E876 Hypokalemia: Secondary | ICD-10-CM | POA: Diagnosis not present

## 2015-01-14 DIAGNOSIS — F0390 Unspecified dementia without behavioral disturbance: Secondary | ICD-10-CM | POA: Diagnosis not present

## 2015-01-14 NOTE — Progress Notes (Signed)
Clinical Summary Ms. Goodlin is a 69 y.o.female seen today for follow up of the following medical problems.   1. Chest pain  - long history of chest pain with negative ischemic testing.  - obtained some notes from [facility] in 2010, reference chest pain at that time, a cath in 2008 with normal coronaries in [place], [place]  - low risk ETT myoview 09/2012, small partially reversible anterior defect possibly due to attenuation  - echo 09/2012 LVEF 60-65%, moderate MR  - 06/2014 Lexiscan MPI no ischemia - new diagnosis of gastric ulcers, previously taking a lot of NSAIDs. Symptoms thought to be GI, she is being followed by GI  - recent admit with chest pain to [facility], no evidence of ACS.   2. Afib  - new diagnosis during recent admission to [facility] with severe anemia, hypothyroidism, and electrolyte abnormalities.. Afib is not mentioned on the discharge summary, however per patient report and review of the EKG it is evident.  - EKG from [facility] shows afib rates 150. This was found in the setting of significant thyroid and electrolyte abnormalities.  - denies any palpitations. No significant SOB.  - occasional feeling of palpitations, lasts for 5-10 minutes.  - not on any rate controlling agents, not on anticoag due to history of severe GI bleed  - recent to [facility] with palpitations and chest pain. Notes reviewed, afib rates reportedly in 130s. At discharge her Toprol and Dilt were actually decreased, from the records it is not clear why.   Past Medical History  Diagnosis Date  . Vascular dementia, uncomplicated   . Unspecified epilepsy without mention of intractable epilepsy   . Tachycardia, unspecified   . Hypoparathyroidism   . Malignant neoplasm of thyroid gland   . Personal history of malignant neoplasm of breast   . Unspecified hypothyroidism   . Esophageal reflux   . Unspecified urinary incontinence   . Insomnia, unspecified   . Migraine   . Anxiety  state, unspecified   . Hypopotassemia   . Wolff-Parkinson-White (WPW) syndrome   . Mitral regurgitation     Moderate, echo, 01/2012  . HLD (hyperlipidemia)   . Uterine cancer   . Breast cancer   . Thyroid cancer   . Atrial fibrillation     no anticoagulation due to hx GI bleeding     Allergies  Allergen Reactions  . Adhesive [Tape]   . Asa [Aspirin] Other (See Comments)    ulcers  . Cyclobenzaprine   . Imdur [Isosorbide]     Headache, and rash   . Iodine   . Pentazocine   . Propoxyphene   . Septra [Sulfamethoxazole-Trimethoprim]   . Sulfa Antibiotics   . Tegretol [Carbamazepine]   . Tetracyclines & Related      Current Outpatient Prescriptions  Medication Sig Dispense Refill  . acetaminophen (TYLENOL) 500 MG tablet Take 500 mg by mouth every 6 (six) hours as needed for mild pain or moderate pain.    . calcitRIOL (ROCALTROL) 0.25 MCG capsule Take 0.25 mcg by mouth 2 (two) times daily.    . calcium carbonate (TUMS - DOSED IN MG ELEMENTAL CALCIUM) 500 MG chewable tablet Chew 1 tablet by mouth 3 (three) times daily.    . Cholecalciferol (VITAMIN D) 2000 UNITS CAPS Take 1 capsule by mouth every evening.     . diltiazem (TIAZAC) 360 MG 24 hr capsule Take 360 mg by mouth every morning. For A.FIB    . divalproex (DEPAKOTE ER) 500  MG 24 hr tablet Take 500 mg by mouth at bedtime.    . DULoxetine (CYMBALTA) 60 MG capsule Take 60 mg by mouth 2 (two) times daily.    Marland Kitchen esomeprazole (NEXIUM) 40 MG capsule Take 40 mg by mouth every morning.     . ferrous sulfate 325 (65 FE) MG tablet Take 325 mg by mouth 2 (two) times daily.    Marland Kitchen levothyroxine (SYNTHROID, LEVOTHROID) 150 MCG tablet Take 150 mcg by mouth daily before breakfast.    . Magnesium 250 MG TABS Take 1 tablet by mouth every morning.    . metoprolol succinate (TOPROL-XL) 50 MG 24 hr tablet Take 150 mg by mouth every morning. Take with or immediately following a meal.    . Multiple Vitamin (MULTIVITAMIN) tablet Take 1 tablet by  mouth every evening.     . nitroGLYCERIN (NITROSTAT) 0.4 MG SL tablet Place 0.4 mg under the tongue every 5 (five) minutes as needed for chest pain.    Marland Kitchen ondansetron (ZOFRAN) 4 MG tablet Take 1 tablet (4 mg total) by mouth every 8 (eight) hours as needed for nausea or vomiting. 12 tablet 0  . potassium chloride SA (K-DUR,KLOR-CON) 20 MEQ tablet Take 40 mEq by mouth 2 (two) times daily.    Marland Kitchen rOPINIRole (REQUIP) 2 MG tablet Take 2 mg by mouth at bedtime.     . SUMAtriptan (IMITREX) 25 MG tablet Take 25 mg by mouth every 2 (two) hours as needed for migraine (*Wait two hours after 1st dose and take two additional tablets. If no relief take one tablet on a different day as needed for migrianes per patient).     Marland Kitchen tiZANidine (ZANAFLEX) 4 MG tablet Take 4 mg by mouth every 6 (six) hours as needed for muscle spasms.     . traZODone (DESYREL) 50 MG tablet Take 50 mg by mouth at bedtime.     No current facility-administered medications for this visit.     Past Surgical History  Procedure Laterality Date  . Abdominal hysterectomy    . Neck surgery       Allergies  Allergen Reactions  . Adhesive [Tape]   . Asa [Aspirin] Other (See Comments)    ulcers  . Cyclobenzaprine   . Imdur [Isosorbide]     Headache, and rash   . Iodine   . Pentazocine   . Propoxyphene   . Septra [Sulfamethoxazole-Trimethoprim]   . Sulfa Antibiotics   . Tegretol [Carbamazepine]   . Tetracyclines & Related       Family History  Problem Relation Age of Onset  . Cancer Mother   . Hyperlipidemia Father   . Cancer Father      Social History Ms. Campanella reports that she has never smoked. She has never used smokeless tobacco. Ms. Newlun reports that she does not drink alcohol.   Review of Systems CONSTITUTIONAL: No weight loss, fever, chills, weakness or fatigue.  HEENT: Eyes: No visual loss, blurred vision, double vision or yellow sclerae.No hearing loss, sneezing, congestion, runny nose or sore throat.  SKIN:  No rash or itching.  CARDIOVASCULAR: per HPI RESPIRATORY: No shortness of breath, cough or sputum.  GASTROINTESTINAL: No anorexia, nausea, vomiting or diarrhea. No abdominal pain or blood.  GENITOURINARY: No burning on urination, no polyuria NEUROLOGICAL: No headache, dizziness, syncope, paralysis, ataxia, numbness or tingling in the extremities. No change in bowel or bladder control.  MUSCULOSKELETAL: No muscle, back pain, joint pain or stiffness.  LYMPHATICS: No enlarged nodes. No history of  splenectomy.  PSYCHIATRIC: No history of depression or anxiety.  ENDOCRINOLOGIC: No reports of sweating, cold or heat intolerance. No polyuria or polydipsia.  Marland Kitchen   Physical Examination p 85 bp 118/70 Wt 141 lbs BMI 28 Gen: resting comfortably, no acute distress HEENT: no scleral icterus, pupils equal round and reactive, no palptable cervical adenopathy,  CV: irreg, no m/r/g, no JVD, no carotid bruits Resp: Clear to auscultation bilaterally GI: abdomen is soft, non-tender, non-distended, normal bowel sounds, no hepatosplenomegaly MSK: extremities are warm, no edema.  Skin: warm, no rash Neuro:  no focal deficits Psych: appropriate affect   Diagnostic Studies 09/2012 MPI  Overall Impression: Low risk stress nuclear study with a small, medium intensity, partially reversible anterior/apical defect consistent with soft tissue attenuation and mild ischemia.  LV Ejection Fraction: 68%. LV Wall Motion: NL LV Function; NL Wall Motion  09/2012 Echo  - Left ventricle: The cavity size was normal. Wall thickness was increased in a pattern of mild LVH. Systolic function was normal. The estimated ejection fraction was in the range of 60% to 65%. Wall motion was normal; there were no regional wall motion abnormalities. Findings consistent with left ventricular diastolic dysfunction. - Aortic valve: Trileaflet. Trivial regurgitation. Mean gradient: 64mm Hg (S). - Mitral valve: Calcified annulus.  Moderate regurgitation directed eccentrically - best seen in the apical views. Otherwise appears mild. Regurgitant volume: 91ml (PISA). - Left atrium: The atrium was mildly to moderately dilated. - Tricuspid valve: Mild regurgitation. - Pulmonary arteries: PA peak pressure: 64mm Hg (S). - Pericardium, extracardiac: A prominent pericardial fat pad was present.  Cath 2008 [facility]  Normal coronaies (see media section of epic)  02/2013 Event monitor No arrhythmias  06/2014 MPI IMPRESSION: 1. No reversible ischemia or infarction.  2. Normal left ventricular wall motion.  3. Left ventricular ejection fraction 45%, low normal function for MPI  4. Low-risk stress test findings*. Assessment and Plan  1. Chest pain:  - long history of atypica chest pain with negative ischemic testing, most recently negative MPI 06/2014 - continue risk factor modification  2. Afib  - recent admit with some elevated rates, from discharge summary they decreased her Toprol XL to 50mg  daily and dilt to 240mg  daily, the reason is not documented - follow rates on current regimen, may need titration.  - not on anticoag due to history of GI bleed  3. Mitral regurgitation  - no current symptoms, continue to follow clinically    F/u 6 weeks  Arnoldo Lenis, M.D.,

## 2015-01-14 NOTE — Patient Instructions (Signed)
Your physician recommends that you schedule a follow-up appointment in: [place] [address] [place]  Your physician recommends that you continue on your current medications as directed. Please refer to the Current Medication list given to you today.  WE HAVE REQUESTED RECORDS FROM [facility]   Thank you for choosing [place]!!

## 2015-01-19 DIAGNOSIS — I4891 Unspecified atrial fibrillation: Secondary | ICD-10-CM | POA: Diagnosis not present

## 2015-01-19 DIAGNOSIS — E876 Hypokalemia: Secondary | ICD-10-CM | POA: Diagnosis not present

## 2015-01-19 DIAGNOSIS — F0390 Unspecified dementia without behavioral disturbance: Secondary | ICD-10-CM | POA: Diagnosis not present

## 2015-01-19 DIAGNOSIS — G40909 Epilepsy, unspecified, not intractable, without status epilepticus: Secondary | ICD-10-CM | POA: Diagnosis not present

## 2015-01-19 DIAGNOSIS — E039 Hypothyroidism, unspecified: Secondary | ICD-10-CM | POA: Diagnosis not present

## 2015-01-22 DIAGNOSIS — E876 Hypokalemia: Secondary | ICD-10-CM | POA: Diagnosis not present

## 2015-01-22 DIAGNOSIS — I4891 Unspecified atrial fibrillation: Secondary | ICD-10-CM | POA: Diagnosis not present

## 2015-01-22 DIAGNOSIS — F0390 Unspecified dementia without behavioral disturbance: Secondary | ICD-10-CM | POA: Diagnosis not present

## 2015-01-22 DIAGNOSIS — G40909 Epilepsy, unspecified, not intractable, without status epilepticus: Secondary | ICD-10-CM | POA: Diagnosis not present

## 2015-01-22 DIAGNOSIS — E039 Hypothyroidism, unspecified: Secondary | ICD-10-CM | POA: Diagnosis not present

## 2015-01-26 DIAGNOSIS — E039 Hypothyroidism, unspecified: Secondary | ICD-10-CM | POA: Diagnosis not present

## 2015-01-26 DIAGNOSIS — F0390 Unspecified dementia without behavioral disturbance: Secondary | ICD-10-CM | POA: Diagnosis not present

## 2015-01-26 DIAGNOSIS — G40909 Epilepsy, unspecified, not intractable, without status epilepticus: Secondary | ICD-10-CM | POA: Diagnosis not present

## 2015-01-26 DIAGNOSIS — I4891 Unspecified atrial fibrillation: Secondary | ICD-10-CM | POA: Diagnosis not present

## 2015-01-26 DIAGNOSIS — E876 Hypokalemia: Secondary | ICD-10-CM | POA: Diagnosis not present

## 2015-01-27 ENCOUNTER — Telehealth: Payer: Self-pay | Admitting: *Deleted

## 2015-01-27 DIAGNOSIS — J209 Acute bronchitis, unspecified: Secondary | ICD-10-CM | POA: Diagnosis not present

## 2015-01-27 DIAGNOSIS — G43919 Migraine, unspecified, intractable, without status migrainosus: Secondary | ICD-10-CM | POA: Diagnosis not present

## 2015-01-27 MED ORDER — DILTIAZEM HCL ER COATED BEADS 240 MG PO CP24: 240.0000 mg | ORAL_CAPSULE | Freq: Every day | ORAL | Status: AC

## 2015-01-27 NOTE — Telephone Encounter (Signed)
Spoke with Maudie Mercury at [facility], refilled diltiazem 240 mg #30 with 6 refills. Pharmacy will refill levothyroxine from pcp.
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
## 2015-01-27 NOTE — Telephone Encounter (Signed)
Can refill dilt at new dose. The thyroid medication will need to be refilled by her pcp  Zandra Abts MD

## 2015-01-28 DIAGNOSIS — F0390 Unspecified dementia without behavioral disturbance: Secondary | ICD-10-CM | POA: Diagnosis not present

## 2015-01-28 DIAGNOSIS — I4891 Unspecified atrial fibrillation: Secondary | ICD-10-CM | POA: Diagnosis not present

## 2015-01-28 DIAGNOSIS — E876 Hypokalemia: Secondary | ICD-10-CM | POA: Diagnosis not present

## 2015-01-28 DIAGNOSIS — G40909 Epilepsy, unspecified, not intractable, without status epilepticus: Secondary | ICD-10-CM | POA: Diagnosis not present

## 2015-01-28 DIAGNOSIS — E039 Hypothyroidism, unspecified: Secondary | ICD-10-CM | POA: Diagnosis not present

## 2015-02-03 DIAGNOSIS — G309 Alzheimer's disease, unspecified: Secondary | ICD-10-CM | POA: Diagnosis not present

## 2015-02-03 DIAGNOSIS — G4700 Insomnia, unspecified: Secondary | ICD-10-CM | POA: Diagnosis not present

## 2015-02-03 DIAGNOSIS — E039 Hypothyroidism, unspecified: Secondary | ICD-10-CM | POA: Diagnosis not present

## 2015-02-03 DIAGNOSIS — I482 Chronic atrial fibrillation: Secondary | ICD-10-CM | POA: Diagnosis not present

## 2015-02-03 DIAGNOSIS — F419 Anxiety disorder, unspecified: Secondary | ICD-10-CM | POA: Diagnosis not present

## 2015-02-16 DIAGNOSIS — I420 Dilated cardiomyopathy: Secondary | ICD-10-CM | POA: Diagnosis not present

## 2015-02-16 DIAGNOSIS — R6889 Other general symptoms and signs: Secondary | ICD-10-CM | POA: Diagnosis not present

## 2015-02-16 DIAGNOSIS — R946 Abnormal results of thyroid function studies: Secondary | ICD-10-CM | POA: Diagnosis not present

## 2015-02-23 DIAGNOSIS — R350 Frequency of micturition: Secondary | ICD-10-CM | POA: Diagnosis not present

## 2015-02-24 DIAGNOSIS — I509 Heart failure, unspecified: Secondary | ICD-10-CM | POA: Diagnosis not present

## 2015-03-02 ENCOUNTER — Ambulatory Visit: Payer: Medicare Other | Admitting: Cardiology

## 2015-03-02 NOTE — Progress Notes (Unsigned)
Clinical Summary Samantha Odonnell is a 69 y.o.female seen today for follow up of the following medical problems.   1. Chest pain  - long history of chest pain with negative ischemic testing.  - obtained some notes from [facility] in 2010, reference chest pain at that time, a cath in 2008 with normal coronaries in [place]  - low risk ETT myoview 09/2012, small partially reversible anterior defect possibly due to attenuation  - echo 09/2012 LVEF 60-65%, moderate MR  - 06/2014 Lexiscan MPI no ischemia - new diagnosis of gastric ulcers, previously taking a lot of NSAIDs. Symptoms thought to be GI, she is being followed by GI  - recent admit with chest pain to [facility], no evidence of ACS.   2. Afib  - new diagnosis during recent admission to [facility] with severe anemia, hypothyroidism, and electrolyte abnormalities.. Afib is not mentioned on the discharge summary, however per patient report and review of the EKG it is evident.  - EKG from [place] shows afib rates 150. This was found in the setting of significant thyroid and electrolyte abnormalities.  - denies any palpitations. No significant SOB.  - occasional feeling of palpitations, lasts for 5-10 minutes.  - not on any rate controlling agents, not on anticoag due to history of severe GI bleed  - recent to [facility] with palpitations and chest pain. Notes reviewed, afib rates reportedly in 130s. At discharge her Toprol and Dilt were actually decreased, from the records it is not clear why.  Past Medical History  Diagnosis Date  . Vascular dementia, uncomplicated   . Unspecified epilepsy without mention of intractable epilepsy   . Tachycardia, unspecified   . Hypoparathyroidism   . Malignant neoplasm of thyroid gland   . Personal history of malignant neoplasm of breast   . Unspecified hypothyroidism   . Esophageal reflux   . Unspecified urinary incontinence   . Insomnia, unspecified   . Migraine   . Anxiety  state, unspecified   . Hypopotassemia   . Wolff-Parkinson-White (WPW) syndrome   . Mitral regurgitation     Moderate, echo, 01/2012  . HLD (hyperlipidemia)   . Uterine cancer   . Breast cancer   . Thyroid cancer   . Atrial fibrillation     no anticoagulation due to hx GI bleeding     Allergies  Allergen Reactions  . Adhesive [Tape]   . Asa [Aspirin] Other (See Comments)    ulcers  . Cyclobenzaprine   . Imdur [Isosorbide]     Headache, and rash   . Iodine   . Pentazocine   . Propoxyphene   . Septra [Sulfamethoxazole-Trimethoprim]   . Sulfa Antibiotics   . Tegretol [Carbamazepine]   . Tetracyclines & Related      Current Outpatient Prescriptions  Medication Sig Dispense Refill  . calcitRIOL (ROCALTROL) 0.25 MCG capsule Take 0.25 mcg by mouth 2 (two) times daily.    . calcium carbonate (TUMS - DOSED IN MG ELEMENTAL CALCIUM) 500 MG chewable tablet Chew 1 tablet by mouth 3 (three) times daily.    . Cholecalciferol (VITAMIN D) 2000 UNITS CAPS Take 1 capsule by mouth every evening.     . diltiazem (CARDIZEM CD) 240 MG 24 hr capsule Take 1 capsule (240 mg total) by mouth daily. 30 capsule 6  . divalproex (DEPAKOTE ER) 500 MG 24 hr tablet Take 500 mg by mouth at bedtime.    . DULoxetine (CYMBALTA) 60 MG capsule Take 60 mg by mouth  2 (two) times daily.    Marland Kitchen esomeprazole (NEXIUM) 40 MG capsule Take 40 mg by mouth every morning.     . ferrous sulfate 325 (65 FE) MG tablet Take 325 mg by mouth 2 (two) times daily.    Marland Kitchen levothyroxine (SYNTHROID, LEVOTHROID) 175 MCG tablet Take 175 mcg by mouth daily before breakfast.    . Magnesium 250 MG TABS Take 1 tablet by mouth every morning.    . metoprolol succinate (TOPROL-XL) 50 MG 24 hr tablet Take 50 mg by mouth daily. Take with or immediately following a meal.    . Multiple Vitamin (MULTIVITAMIN) tablet Take 1 tablet by mouth every evening.     . nitroGLYCERIN (NITROSTAT) 0.4 MG SL tablet Place 0.4 mg under the tongue every 5 (five)  minutes as needed for chest pain.    Marland Kitchen ondansetron (ZOFRAN) 4 MG tablet Take 1 tablet (4 mg total) by mouth every 8 (eight) hours as needed for nausea or vomiting. 12 tablet 0  . potassium chloride (MICRO-K) 10 MEQ CR capsule Take 10 mEq by mouth daily.    Marland Kitchen rOPINIRole (REQUIP) 2 MG tablet Take 2 mg by mouth at bedtime.     Marland Kitchen tiZANidine (ZANAFLEX) 4 MG tablet Take 4 mg by mouth every 6 (six) hours as needed for muscle spasms.     . traZODone (DESYREL) 50 MG tablet Take 50 mg by mouth at bedtime.     No current facility-administered medications for this visit.     Past Surgical History  Procedure Laterality Date  . Abdominal hysterectomy    . Neck surgery       Allergies  Allergen Reactions  . Adhesive [Tape]   . Asa [Aspirin] Other (See Comments)    ulcers  . Cyclobenzaprine   . Imdur [Isosorbide]     Headache, and rash   . Iodine   . Pentazocine   . Propoxyphene   . Septra [Sulfamethoxazole-Trimethoprim]   . Sulfa Antibiotics   . Tegretol [Carbamazepine]   . Tetracyclines & Related       Family History  Problem Relation Age of Onset  . Cancer Mother   . Hyperlipidemia Father   . Cancer Father      Social History Samantha Odonnell reports that she has never smoked. She has never used smokeless tobacco. Samantha Odonnell reports that she does not drink alcohol.   Review of Systems CONSTITUTIONAL: No weight loss, fever, chills, weakness or fatigue.  HEENT: Eyes: No visual loss, blurred vision, double vision or yellow sclerae.No hearing loss, sneezing, congestion, runny nose or sore throat.  SKIN: No rash or itching.  CARDIOVASCULAR:  RESPIRATORY: No shortness of breath, cough or sputum.  GASTROINTESTINAL: No anorexia, nausea, vomiting or diarrhea. No abdominal pain or blood.  GENITOURINARY: No burning on urination, no polyuria NEUROLOGICAL: No headache, dizziness, syncope, paralysis, ataxia, numbness or tingling in the extremities. No change in bowel or bladder control.    MUSCULOSKELETAL: No muscle, back pain, joint pain or stiffness.  LYMPHATICS: No enlarged nodes. No history of splenectomy.  PSYCHIATRIC: No history of depression or anxiety.  ENDOCRINOLOGIC: No reports of sweating, cold or heat intolerance. No polyuria or polydipsia.  Marland Kitchen   Physical Examination There were no vitals filed for this visit. There were no vitals filed for this visit.  Gen: resting comfortably, no acute distress HEENT: no scleral icterus, pupils equal round and reactive, no palptable cervical adenopathy,  CV Resp: Clear to auscultation bilaterally GI: abdomen is soft, non-tender, non-distended, normal  bowel sounds, no hepatosplenomegaly MSK: extremities are warm, no edema.  Skin: warm, no rash Neuro:  no focal deficits Psych: appropriate affect   Diagnostic Studies  09/2012 MPI  Overall Impression: Low risk stress nuclear study with a small, medium intensity, partially reversible anterior/apical defect consistent with soft tissue attenuation and mild ischemia.  LV Ejection Fraction: 68%. LV Wall Motion: NL LV Function; NL Wall Motion  09/2012 Echo  - Left ventricle: The cavity size was normal. Wall thickness was increased in a pattern of mild LVH. Systolic function was normal. The estimated ejection fraction was in the range of 60% to 65%. Wall motion was normal; there were no regional wall motion abnormalities. Findings consistent with left ventricular diastolic dysfunction. - Aortic valve: Trileaflet. Trivial regurgitation. Mean gradient: 29m Hg (S). - Mitral valve: Calcified annulus. Moderate regurgitation directed eccentrically - best seen in the apical views. Otherwise appears mild. Regurgitant volume: 392m(PISA). - Left atrium: The atrium was mildly to moderately dilated. - Tricuspid valve: Mild regurgitation. - Pulmonary arteries: PA peak pressure: 4166mg (S). - Pericardium, extracardiac: A prominent pericardial fat pad was present.  [facility]  02/2013 Event monitor No arrhythmias  06/2014 MPI IMPRESSION: 1. No reversible ischemia or infarction.  2. Normal left ventricular wall motion.  3. Left ventricular ejection fraction 45%, low normal function for MPI  4. Low-risk stress test findings*.   Assessment and Plan   1. Chest pain:  - long history of atypica chest pain with negative ischemic testing, most recently negative MPI 06/2014 - continue risk factor modification  2. Afib  - recent admit with some elevated rates, from discharge summary they decreased her Toprol XL to '50mg'$  daily and dilt to '240mg'$  daily, the reason is not documented - follow rates on current regimen, may need titration.  - not on anticoag due to history of GI bleed  3. Mitral regurgitation  - no current symptoms, continue to follow clinically     JonArnoldo Lenis.D

## 2015-03-11 ENCOUNTER — Ambulatory Visit: Payer: Medicare Other | Admitting: Cardiology

## 2015-03-15 DIAGNOSIS — G40909 Epilepsy, unspecified, not intractable, without status epilepticus: Secondary | ICD-10-CM | POA: Diagnosis not present

## 2015-03-15 DIAGNOSIS — I4891 Unspecified atrial fibrillation: Secondary | ICD-10-CM | POA: Diagnosis not present

## 2015-03-15 DIAGNOSIS — E876 Hypokalemia: Secondary | ICD-10-CM | POA: Diagnosis not present

## 2015-03-15 DIAGNOSIS — Z452 Encounter for adjustment and management of vascular access device: Secondary | ICD-10-CM | POA: Diagnosis not present

## 2015-03-15 DIAGNOSIS — E039 Hypothyroidism, unspecified: Secondary | ICD-10-CM | POA: Diagnosis not present

## 2015-03-15 DIAGNOSIS — F0390 Unspecified dementia without behavioral disturbance: Secondary | ICD-10-CM | POA: Diagnosis not present

## 2015-03-26 IMAGING — CR DG CHEST 1V PORT
1 series · 1 of 1 positions shown · non-contrast
Comparison: None.

CLINICAL DATA: Chest pain.

EXAM:
PORTABLE CHEST - 1 VIEW

[portable]
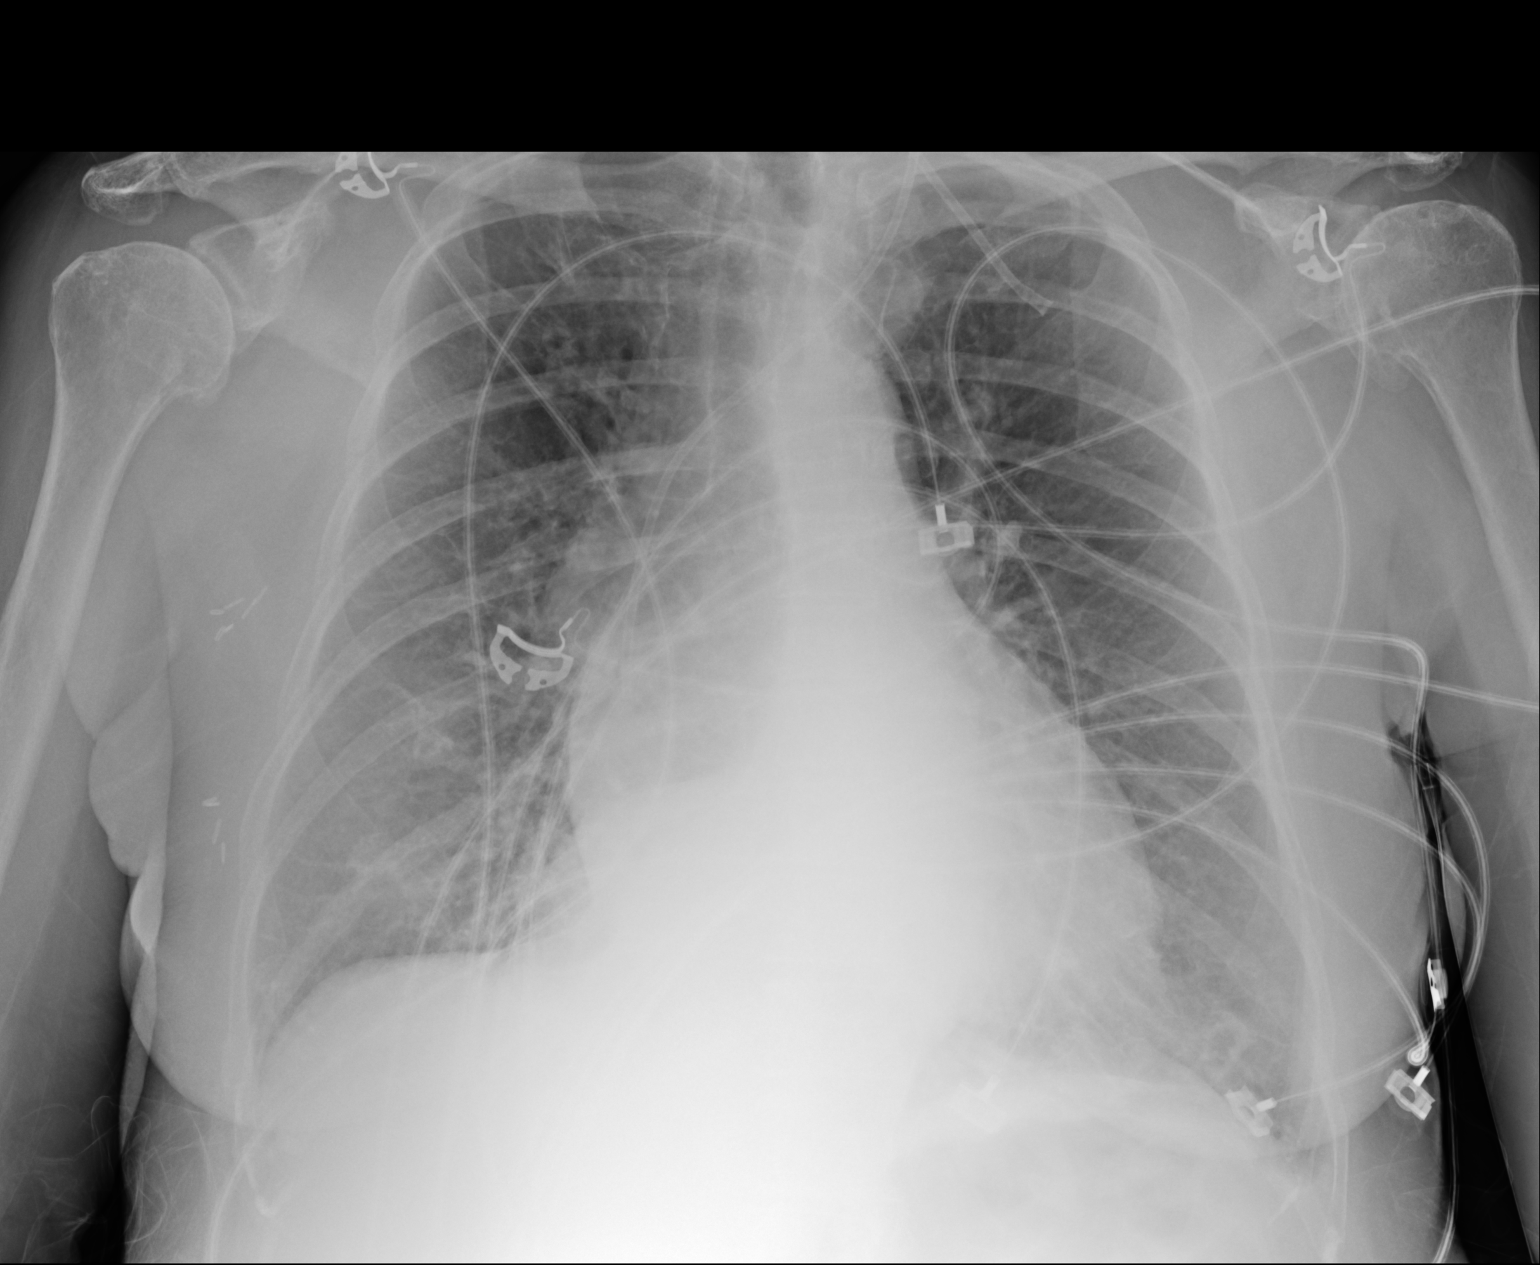

[1 of 1 positions shown; findings below may reference images not displayed]

FINDINGS: There is hyperinflation of the lungs compatible with COPD. Heart is
borderline in size. Left central line tip is at the cavoatrial
junction. Suspect hiatal hernia. Bibasilar densities likely reflect
atelectasis. No effusions. No acute bony abnormality.
IMPRESSION: COPD.  Bibasilar atelectasis.

## 2015-03-29 DIAGNOSIS — F0390 Unspecified dementia without behavioral disturbance: Secondary | ICD-10-CM | POA: Diagnosis not present

## 2015-03-29 DIAGNOSIS — I4891 Unspecified atrial fibrillation: Secondary | ICD-10-CM | POA: Diagnosis not present

## 2015-03-29 DIAGNOSIS — G40909 Epilepsy, unspecified, not intractable, without status epilepticus: Secondary | ICD-10-CM | POA: Diagnosis not present

## 2015-03-29 DIAGNOSIS — E876 Hypokalemia: Secondary | ICD-10-CM | POA: Diagnosis not present

## 2015-03-29 DIAGNOSIS — Z452 Encounter for adjustment and management of vascular access device: Secondary | ICD-10-CM | POA: Diagnosis not present

## 2015-03-29 DIAGNOSIS — E039 Hypothyroidism, unspecified: Secondary | ICD-10-CM | POA: Diagnosis not present

## 2015-03-29 IMAGING — NM NM MYOCAR MULTI W/SPECT W/WALL MOTION & EF
2 series · 12 of 12 positions shown · non-contrast
Comparison: None.

CLINICAL DATA: 68-year-old female with no known history of coronary
artery disease referred for chest pain.

EXAM:
MYOCARDIAL IMAGING WITH SPECT (REST AND PHARMACOLOGIC-STRESS)
GATED LEFT VENTRICULAR WALL MOTION STUDY
LEFT VENTRICULAR EJECTION FRACTION
TECHNIQUE: Standard myocardial SPECT imaging was performed after resting
intravenous injection of 10 mCi Qc-LLm sestamibi. Subsequently,
intravenous infusion of Lexiscan was performed under the supervision
of the Cardiology staff. At peak effect of the drug, 30 mCi Qc-LLm
sestamibi was injected intravenously and standard myocardial SPECT
imaging was performed. Quantitative gated imaging was also performed
to evaluate left ventricular wall motion, and estimate left
ventricular ejection fraction.

[Series 1: rest · 8.28mm/px · 6 of 64 frames shown]
[frame 6/64]
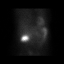
[frame 16/64]
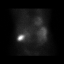
[frame 27/64]
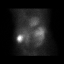
[frame 38/64]
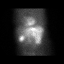
[frame 48/64]
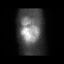
[frame 59/64]
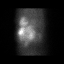

[Series 2: stress gated · 8.28mm/px · 6 of 64 frames shown]
[frame 6/64]
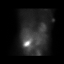
[frame 16/64]
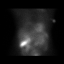
[frame 27/64]
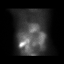
[frame 38/64]
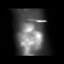
[frame 48/64]
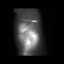
[frame 59/64]
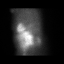

[12 of 12 positions shown; findings below may reference images not displayed]

FINDINGS: Pharmacological stress

Baseline EKG showed atrial fibrillation. After injection heart rate
increased from 105 beats per min up to 136 beats per min, and blood
pressure increased from 113/84 up to 115/84. The test was stopped
after injection was complete, the patient did not experience any
chest pain. Post-injection EKG showed atrial fibrillation with no
specific ischemic changes.

Raw images show normal radiotracer uptake, there is some contrast
extravasation noted.

Perfusion: No decreased activity in the left ventricle on stress
imaging to suggest reversible ischemia or infarction.

Wall Motion: Normal left ventricular wall motion. No left
ventricular dilation.

Left Ventricular Ejection Fraction: 45 %

End diastolic volume 48 ml

End systolic volume 27 ml
IMPRESSION: 1. No reversible ischemia or infarction.

2. Normal left ventricular wall motion.

3. Left ventricular ejection fraction 45%, low normal function for
MPI

4. Low-risk stress test findings*.

*0490 Appropriate Use Criteria for Coronary Revascularization
Focused Update: J Am Coll Cardiol. 0490;59(9):857-881.
[URL]

## 2015-04-01 ENCOUNTER — Ambulatory Visit: Payer: Medicare Other | Admitting: Cardiology

## 2015-04-12 DIAGNOSIS — Z883 Allergy status to other anti-infective agents status: Secondary | ICD-10-CM | POA: Diagnosis not present

## 2015-04-12 DIAGNOSIS — F411 Generalized anxiety disorder: Secondary | ICD-10-CM | POA: Diagnosis present

## 2015-04-12 DIAGNOSIS — R410 Disorientation, unspecified: Secondary | ICD-10-CM | POA: Diagnosis not present

## 2015-04-12 DIAGNOSIS — Z90710 Acquired absence of both cervix and uterus: Secondary | ICD-10-CM | POA: Diagnosis not present

## 2015-04-12 DIAGNOSIS — T503X5A Adverse effect of electrolytic, caloric and water-balance agents, initial encounter: Secondary | ICD-10-CM | POA: Diagnosis not present

## 2015-04-12 DIAGNOSIS — G43909 Migraine, unspecified, not intractable, without status migrainosus: Secondary | ICD-10-CM | POA: Diagnosis not present

## 2015-04-12 DIAGNOSIS — K219 Gastro-esophageal reflux disease without esophagitis: Secondary | ICD-10-CM | POA: Diagnosis present

## 2015-04-12 DIAGNOSIS — E860 Dehydration: Secondary | ICD-10-CM | POA: Diagnosis not present

## 2015-04-12 DIAGNOSIS — Z882 Allergy status to sulfonamides status: Secondary | ICD-10-CM | POA: Diagnosis not present

## 2015-04-12 DIAGNOSIS — F418 Other specified anxiety disorders: Secondary | ICD-10-CM | POA: Diagnosis not present

## 2015-04-12 DIAGNOSIS — M545 Low back pain: Secondary | ICD-10-CM | POA: Diagnosis present

## 2015-04-12 DIAGNOSIS — N289 Disorder of kidney and ureter, unspecified: Secondary | ICD-10-CM | POA: Diagnosis not present

## 2015-04-12 DIAGNOSIS — G4700 Insomnia, unspecified: Secondary | ICD-10-CM | POA: Diagnosis present

## 2015-04-12 DIAGNOSIS — Z880 Allergy status to penicillin: Secondary | ICD-10-CM | POA: Diagnosis not present

## 2015-04-12 DIAGNOSIS — Z885 Allergy status to narcotic agent status: Secondary | ICD-10-CM | POA: Diagnosis not present

## 2015-04-12 DIAGNOSIS — D649 Anemia, unspecified: Secondary | ICD-10-CM | POA: Diagnosis not present

## 2015-04-12 DIAGNOSIS — D638 Anemia in other chronic diseases classified elsewhere: Secondary | ICD-10-CM | POA: Diagnosis present

## 2015-04-12 DIAGNOSIS — T452X5A Adverse effect of vitamins, initial encounter: Secondary | ICD-10-CM | POA: Diagnosis not present

## 2015-04-12 DIAGNOSIS — Z049 Encounter for examination and observation for unspecified reason: Secondary | ICD-10-CM | POA: Diagnosis not present

## 2015-04-12 DIAGNOSIS — M6281 Muscle weakness (generalized): Secondary | ICD-10-CM | POA: Diagnosis not present

## 2015-04-12 DIAGNOSIS — F0390 Unspecified dementia without behavioral disturbance: Secondary | ICD-10-CM | POA: Diagnosis not present

## 2015-04-12 DIAGNOSIS — E876 Hypokalemia: Secondary | ICD-10-CM | POA: Diagnosis present

## 2015-04-12 DIAGNOSIS — F329 Major depressive disorder, single episode, unspecified: Secondary | ICD-10-CM | POA: Diagnosis present

## 2015-04-12 DIAGNOSIS — I482 Chronic atrial fibrillation: Secondary | ICD-10-CM | POA: Diagnosis present

## 2015-04-12 DIAGNOSIS — G8929 Other chronic pain: Secondary | ICD-10-CM | POA: Diagnosis present

## 2015-04-12 DIAGNOSIS — R4182 Altered mental status, unspecified: Secondary | ICD-10-CM | POA: Diagnosis not present

## 2015-04-12 DIAGNOSIS — E039 Hypothyroidism, unspecified: Secondary | ICD-10-CM | POA: Diagnosis present

## 2015-04-12 DIAGNOSIS — Z881 Allergy status to other antibiotic agents status: Secondary | ICD-10-CM | POA: Diagnosis not present

## 2015-04-12 DIAGNOSIS — G2581 Restless legs syndrome: Secondary | ICD-10-CM | POA: Diagnosis not present

## 2015-04-12 DIAGNOSIS — R2689 Other abnormalities of gait and mobility: Secondary | ICD-10-CM | POA: Diagnosis not present

## 2015-04-12 DIAGNOSIS — R51 Headache: Secondary | ICD-10-CM | POA: Diagnosis present

## 2015-04-12 DIAGNOSIS — Z853 Personal history of malignant neoplasm of breast: Secondary | ICD-10-CM | POA: Diagnosis not present

## 2015-04-15 ENCOUNTER — Ambulatory Visit: Payer: Medicare Other | Admitting: Cardiology

## 2015-04-15 DIAGNOSIS — M6281 Muscle weakness (generalized): Secondary | ICD-10-CM | POA: Diagnosis not present

## 2015-04-15 DIAGNOSIS — I482 Chronic atrial fibrillation: Secondary | ICD-10-CM | POA: Diagnosis not present

## 2015-04-15 DIAGNOSIS — E876 Hypokalemia: Secondary | ICD-10-CM | POA: Diagnosis not present

## 2015-04-15 DIAGNOSIS — R4182 Altered mental status, unspecified: Secondary | ICD-10-CM | POA: Diagnosis not present

## 2015-04-15 DIAGNOSIS — F418 Other specified anxiety disorders: Secondary | ICD-10-CM | POA: Diagnosis not present

## 2015-04-15 DIAGNOSIS — G43909 Migraine, unspecified, not intractable, without status migrainosus: Secondary | ICD-10-CM | POA: Diagnosis not present

## 2015-04-15 DIAGNOSIS — E039 Hypothyroidism, unspecified: Secondary | ICD-10-CM | POA: Diagnosis not present

## 2015-04-15 DIAGNOSIS — R2689 Other abnormalities of gait and mobility: Secondary | ICD-10-CM | POA: Diagnosis not present

## 2015-04-15 DIAGNOSIS — G2581 Restless legs syndrome: Secondary | ICD-10-CM | POA: Diagnosis not present

## 2015-04-15 DIAGNOSIS — D649 Anemia, unspecified: Secondary | ICD-10-CM | POA: Diagnosis not present

## 2015-04-28 NOTE — Patient Outreach (Signed)
[facility]) Care Management  04/28/2015  Samantha Odonnell Feb 09, 1946 [id number]   Referral from [place] [facility], assigned Sherrin Daisy, RN to outreach.  [name]. [place], [place] Management [place] Assistant Phone: [phone] Fax: [phone]

## 2015-05-14 DIAGNOSIS — E876 Hypokalemia: Secondary | ICD-10-CM | POA: Diagnosis not present

## 2015-05-20 ENCOUNTER — Other Ambulatory Visit: Payer: Self-pay

## 2015-05-20 DIAGNOSIS — E039 Hypothyroidism, unspecified: Secondary | ICD-10-CM | POA: Diagnosis not present

## 2015-05-20 DIAGNOSIS — E876 Hypokalemia: Secondary | ICD-10-CM | POA: Diagnosis not present

## 2015-05-20 DIAGNOSIS — K219 Gastro-esophageal reflux disease without esophagitis: Secondary | ICD-10-CM | POA: Diagnosis not present

## 2015-05-20 DIAGNOSIS — F0151 Vascular dementia with behavioral disturbance: Secondary | ICD-10-CM | POA: Diagnosis not present

## 2015-05-20 DIAGNOSIS — D509 Iron deficiency anemia, unspecified: Secondary | ICD-10-CM | POA: Diagnosis not present

## 2015-05-20 DIAGNOSIS — E209 Hypoparathyroidism, unspecified: Secondary | ICD-10-CM | POA: Diagnosis not present

## 2015-05-20 DIAGNOSIS — F419 Anxiety disorder, unspecified: Secondary | ICD-10-CM | POA: Diagnosis not present

## 2015-05-20 DIAGNOSIS — G43919 Migraine, unspecified, intractable, without status migrainosus: Secondary | ICD-10-CM | POA: Diagnosis not present

## 2015-05-20 DIAGNOSIS — G40309 Generalized idiopathic epilepsy and epileptic syndromes, not intractable, without status epilepticus: Secondary | ICD-10-CM | POA: Diagnosis not present

## 2015-05-20 DIAGNOSIS — I482 Chronic atrial fibrillation: Secondary | ICD-10-CM | POA: Diagnosis not present

## 2015-05-20 NOTE — Patient Outreach (Signed)
[facility]) Care Management  05/20/2015  Samantha Odonnell 04/28/1946 [id number]   Screening call attempted.  No answer.  [name], RN, MSN [place] [phone] Fax [phone]

## 2015-06-03 ENCOUNTER — Other Ambulatory Visit: Payer: Self-pay

## 2015-06-03 NOTE — Patient Outreach (Signed)
[facility]) Care Management  06/03/2015  Samantha Odonnell [birth year] [id number]   Second unsuccessful attempt to reach patient by phone.  HIPPA appropriate message left requesting call back.  [name], RN, MSN [place] [phone] Fax [phone]

## 2015-06-09 ENCOUNTER — Other Ambulatory Visit: Payer: Self-pay

## 2015-06-09 DIAGNOSIS — Z7982 Long term (current) use of aspirin: Secondary | ICD-10-CM | POA: Diagnosis not present

## 2015-06-09 DIAGNOSIS — Z882 Allergy status to sulfonamides status: Secondary | ICD-10-CM | POA: Diagnosis not present

## 2015-06-09 DIAGNOSIS — D638 Anemia in other chronic diseases classified elsewhere: Secondary | ICD-10-CM | POA: Diagnosis not present

## 2015-06-09 DIAGNOSIS — E890 Postprocedural hypothyroidism: Secondary | ICD-10-CM | POA: Diagnosis not present

## 2015-06-09 DIAGNOSIS — I482 Chronic atrial fibrillation: Secondary | ICD-10-CM | POA: Diagnosis not present

## 2015-06-09 DIAGNOSIS — Z79899 Other long term (current) drug therapy: Secondary | ICD-10-CM | POA: Diagnosis not present

## 2015-06-09 DIAGNOSIS — F418 Other specified anxiety disorders: Secondary | ICD-10-CM | POA: Diagnosis not present

## 2015-06-09 DIAGNOSIS — M13841 Other specified arthritis, right hand: Secondary | ICD-10-CM | POA: Diagnosis not present

## 2015-06-09 DIAGNOSIS — M13842 Other specified arthritis, left hand: Secondary | ICD-10-CM | POA: Diagnosis not present

## 2015-06-09 DIAGNOSIS — J9811 Atelectasis: Secondary | ICD-10-CM | POA: Diagnosis not present

## 2015-06-09 DIAGNOSIS — Z9109 Other allergy status, other than to drugs and biological substances: Secondary | ICD-10-CM | POA: Diagnosis not present

## 2015-06-09 DIAGNOSIS — Z853 Personal history of malignant neoplasm of breast: Secondary | ICD-10-CM | POA: Diagnosis not present

## 2015-06-09 DIAGNOSIS — D649 Anemia, unspecified: Secondary | ICD-10-CM | POA: Diagnosis not present

## 2015-06-09 DIAGNOSIS — Z885 Allergy status to narcotic agent status: Secondary | ICD-10-CM | POA: Diagnosis not present

## 2015-06-09 DIAGNOSIS — F329 Major depressive disorder, single episode, unspecified: Secondary | ICD-10-CM | POA: Diagnosis not present

## 2015-06-09 DIAGNOSIS — G43711 Chronic migraine without aura, intractable, with status migrainosus: Secondary | ICD-10-CM | POA: Diagnosis not present

## 2015-06-09 DIAGNOSIS — R079 Chest pain, unspecified: Secondary | ICD-10-CM | POA: Diagnosis not present

## 2015-06-09 DIAGNOSIS — Z85850 Personal history of malignant neoplasm of thyroid: Secondary | ICD-10-CM | POA: Diagnosis not present

## 2015-06-09 DIAGNOSIS — Z886 Allergy status to analgesic agent status: Secondary | ICD-10-CM | POA: Diagnosis not present

## 2015-06-09 DIAGNOSIS — F411 Generalized anxiety disorder: Secondary | ICD-10-CM | POA: Diagnosis not present

## 2015-06-09 DIAGNOSIS — Z881 Allergy status to other antibiotic agents status: Secondary | ICD-10-CM | POA: Diagnosis not present

## 2015-06-09 DIAGNOSIS — I4891 Unspecified atrial fibrillation: Secondary | ICD-10-CM | POA: Diagnosis not present

## 2015-06-09 DIAGNOSIS — E209 Hypoparathyroidism, unspecified: Secondary | ICD-10-CM | POA: Diagnosis not present

## 2015-06-09 DIAGNOSIS — K219 Gastro-esophageal reflux disease without esophagitis: Secondary | ICD-10-CM | POA: Diagnosis not present

## 2015-06-09 DIAGNOSIS — R0602 Shortness of breath: Secondary | ICD-10-CM | POA: Diagnosis not present

## 2015-06-09 DIAGNOSIS — R0789 Other chest pain: Secondary | ICD-10-CM | POA: Diagnosis not present

## 2015-06-09 DIAGNOSIS — G43909 Migraine, unspecified, not intractable, without status migrainosus: Secondary | ICD-10-CM | POA: Diagnosis not present

## 2015-06-09 NOTE — Patient Outreach (Signed)
[facility] [facility]) Care Management  06/09/2015  Samantha Odonnell 1945/11/11 [id number]   Screening call made today due to [place] with patient's daughter, who stated that patient is currently in the hospital for observation for cardiac issues.  Daughter requests a call back next week to discuss [facility] services with patient.  [name], RN, MSN [place] [phone] Fax [phone]

## 2015-06-10 DIAGNOSIS — R0789 Other chest pain: Secondary | ICD-10-CM | POA: Diagnosis not present

## 2015-06-10 DIAGNOSIS — I482 Chronic atrial fibrillation: Secondary | ICD-10-CM | POA: Diagnosis not present

## 2015-06-16 ENCOUNTER — Other Ambulatory Visit: Payer: Self-pay

## 2015-06-16 NOTE — Patient Outreach (Signed)
[name]) Care Management  06/16/2015  Samantha Odonnell 17-Mar-1946 [id number]   Unsuccessful attempt to reach patient's dau (POA).  HIPPA appropriate message left requesting call back.  [name], RN, MSN [place] [phone] Fax [phone]

## 2015-06-22 ENCOUNTER — Other Ambulatory Visit: Payer: Self-pay

## 2015-06-22 NOTE — Patient Outreach (Signed)
[facility]) Care Management  06/22/2015  Julie Nay [birth year] [id number]   Unsuccessful attempt to reach patient or her daughter this date.  Message left requesting call back.  [name], RN, MSN [place] [phone] Fax [phone]

## 2015-06-29 DIAGNOSIS — M1990 Unspecified osteoarthritis, unspecified site: Secondary | ICD-10-CM | POA: Diagnosis not present

## 2015-06-29 DIAGNOSIS — G43919 Migraine, unspecified, intractable, without status migrainosus: Secondary | ICD-10-CM | POA: Diagnosis not present

## 2015-06-29 DIAGNOSIS — R000 Tachycardia, unspecified: Secondary | ICD-10-CM | POA: Diagnosis not present

## 2015-06-29 DIAGNOSIS — Z23 Encounter for immunization: Secondary | ICD-10-CM | POA: Diagnosis not present

## 2015-06-29 DIAGNOSIS — K219 Gastro-esophageal reflux disease without esophagitis: Secondary | ICD-10-CM | POA: Diagnosis not present

## 2015-06-29 DIAGNOSIS — R600 Localized edema: Secondary | ICD-10-CM | POA: Diagnosis not present

## 2015-06-29 DIAGNOSIS — K5909 Other constipation: Secondary | ICD-10-CM | POA: Diagnosis not present

## 2015-06-29 DIAGNOSIS — I482 Chronic atrial fibrillation: Secondary | ICD-10-CM | POA: Diagnosis not present

## 2015-06-29 DIAGNOSIS — K259 Gastric ulcer, unspecified as acute or chronic, without hemorrhage or perforation: Secondary | ICD-10-CM | POA: Diagnosis not present

## 2015-06-29 DIAGNOSIS — F419 Anxiety disorder, unspecified: Secondary | ICD-10-CM | POA: Diagnosis not present

## 2015-06-29 DIAGNOSIS — G40309 Generalized idiopathic epilepsy and epileptic syndromes, not intractable, without status epilepticus: Secondary | ICD-10-CM | POA: Diagnosis not present

## 2015-06-29 DIAGNOSIS — F0151 Vascular dementia with behavioral disturbance: Secondary | ICD-10-CM | POA: Diagnosis not present

## 2015-07-05 DIAGNOSIS — I340 Nonrheumatic mitral (valve) insufficiency: Secondary | ICD-10-CM | POA: Diagnosis not present

## 2015-07-05 DIAGNOSIS — I4891 Unspecified atrial fibrillation: Secondary | ICD-10-CM | POA: Diagnosis not present

## 2015-07-05 DIAGNOSIS — M79604 Pain in right leg: Secondary | ICD-10-CM | POA: Diagnosis not present

## 2015-07-05 DIAGNOSIS — M79605 Pain in left leg: Secondary | ICD-10-CM | POA: Diagnosis not present

## 2015-07-05 DIAGNOSIS — R600 Localized edema: Secondary | ICD-10-CM | POA: Diagnosis not present

## 2015-07-05 DIAGNOSIS — I517 Cardiomegaly: Secondary | ICD-10-CM | POA: Diagnosis not present

## 2015-07-05 DIAGNOSIS — R609 Edema, unspecified: Secondary | ICD-10-CM | POA: Diagnosis not present

## 2015-07-06 NOTE — Patient Outreach (Signed)
[facility]) Care Management  07/06/2015  Shana Zavaleta 06-02-1946 [id number]   Notification from [name], RN to close case due to unable to contact patient for [place] Management services.  Thanks, [name]. [place], [place] Assistant Phone: [phone] Fax: [phone]

## 2015-07-30 DIAGNOSIS — K219 Gastro-esophageal reflux disease without esophagitis: Secondary | ICD-10-CM | POA: Diagnosis not present

## 2015-07-30 DIAGNOSIS — K920 Hematemesis: Secondary | ICD-10-CM | POA: Diagnosis not present

## 2015-07-30 DIAGNOSIS — R1900 Intra-abdominal and pelvic swelling, mass and lump, unspecified site: Secondary | ICD-10-CM | POA: Diagnosis not present

## 2015-07-30 DIAGNOSIS — K259 Gastric ulcer, unspecified as acute or chronic, without hemorrhage or perforation: Secondary | ICD-10-CM | POA: Diagnosis not present

## 2015-08-02 DIAGNOSIS — F419 Anxiety disorder, unspecified: Secondary | ICD-10-CM | POA: Diagnosis not present

## 2015-08-02 DIAGNOSIS — G40909 Epilepsy, unspecified, not intractable, without status epilepticus: Secondary | ICD-10-CM | POA: Diagnosis not present

## 2015-08-02 DIAGNOSIS — Z881 Allergy status to other antibiotic agents status: Secondary | ICD-10-CM | POA: Diagnosis not present

## 2015-08-02 DIAGNOSIS — E039 Hypothyroidism, unspecified: Secondary | ICD-10-CM | POA: Diagnosis not present

## 2015-08-02 DIAGNOSIS — I4891 Unspecified atrial fibrillation: Secondary | ICD-10-CM | POA: Diagnosis not present

## 2015-08-02 DIAGNOSIS — Z85850 Personal history of malignant neoplasm of thyroid: Secondary | ICD-10-CM | POA: Diagnosis not present

## 2015-08-02 DIAGNOSIS — Z882 Allergy status to sulfonamides status: Secondary | ICD-10-CM | POA: Diagnosis not present

## 2015-08-02 DIAGNOSIS — Z885 Allergy status to narcotic agent status: Secondary | ICD-10-CM | POA: Diagnosis not present

## 2015-08-02 DIAGNOSIS — G4700 Insomnia, unspecified: Secondary | ICD-10-CM | POA: Diagnosis not present

## 2015-08-02 DIAGNOSIS — R05 Cough: Secondary | ICD-10-CM | POA: Diagnosis not present

## 2015-08-02 DIAGNOSIS — Z9109 Other allergy status, other than to drugs and biological substances: Secondary | ICD-10-CM | POA: Diagnosis not present

## 2015-08-02 DIAGNOSIS — R079 Chest pain, unspecified: Secondary | ICD-10-CM | POA: Diagnosis not present

## 2015-08-02 DIAGNOSIS — J189 Pneumonia, unspecified organism: Secondary | ICD-10-CM | POA: Diagnosis not present

## 2015-08-02 DIAGNOSIS — R091 Pleurisy: Secondary | ICD-10-CM | POA: Diagnosis not present

## 2015-08-02 DIAGNOSIS — F0390 Unspecified dementia without behavioral disturbance: Secondary | ICD-10-CM | POA: Diagnosis not present

## 2015-08-02 DIAGNOSIS — R0789 Other chest pain: Secondary | ICD-10-CM | POA: Diagnosis not present

## 2015-08-03 DIAGNOSIS — J189 Pneumonia, unspecified organism: Secondary | ICD-10-CM | POA: Diagnosis not present

## 2015-08-03 DIAGNOSIS — R079 Chest pain, unspecified: Secondary | ICD-10-CM | POA: Diagnosis not present

## 2015-08-04 DIAGNOSIS — J189 Pneumonia, unspecified organism: Secondary | ICD-10-CM | POA: Diagnosis not present

## 2015-08-06 DIAGNOSIS — Z853 Personal history of malignant neoplasm of breast: Secondary | ICD-10-CM | POA: Diagnosis not present

## 2015-08-06 DIAGNOSIS — K259 Gastric ulcer, unspecified as acute or chronic, without hemorrhage or perforation: Secondary | ICD-10-CM | POA: Diagnosis not present

## 2015-08-06 DIAGNOSIS — C73 Malignant neoplasm of thyroid gland: Secondary | ICD-10-CM | POA: Diagnosis not present

## 2015-08-06 DIAGNOSIS — F329 Major depressive disorder, single episode, unspecified: Secondary | ICD-10-CM | POA: Diagnosis not present

## 2015-08-06 DIAGNOSIS — M1990 Unspecified osteoarthritis, unspecified site: Secondary | ICD-10-CM | POA: Diagnosis not present

## 2015-08-06 DIAGNOSIS — F0150 Vascular dementia without behavioral disturbance: Secondary | ICD-10-CM | POA: Diagnosis not present

## 2015-08-06 DIAGNOSIS — F411 Generalized anxiety disorder: Secondary | ICD-10-CM | POA: Diagnosis not present

## 2015-08-06 DIAGNOSIS — K219 Gastro-esophageal reflux disease without esophagitis: Secondary | ICD-10-CM | POA: Diagnosis not present

## 2015-08-06 DIAGNOSIS — J159 Unspecified bacterial pneumonia: Secondary | ICD-10-CM | POA: Diagnosis not present

## 2015-08-06 DIAGNOSIS — G43919 Migraine, unspecified, intractable, without status migrainosus: Secondary | ICD-10-CM | POA: Diagnosis not present

## 2015-08-06 DIAGNOSIS — I509 Heart failure, unspecified: Secondary | ICD-10-CM | POA: Diagnosis not present

## 2015-08-06 DIAGNOSIS — C7800 Secondary malignant neoplasm of unspecified lung: Secondary | ICD-10-CM | POA: Diagnosis not present

## 2015-08-06 DIAGNOSIS — I482 Chronic atrial fibrillation: Secondary | ICD-10-CM | POA: Diagnosis not present

## 2015-08-10 DIAGNOSIS — F411 Generalized anxiety disorder: Secondary | ICD-10-CM | POA: Diagnosis not present

## 2015-08-10 DIAGNOSIS — F329 Major depressive disorder, single episode, unspecified: Secondary | ICD-10-CM | POA: Diagnosis not present

## 2015-08-10 DIAGNOSIS — C73 Malignant neoplasm of thyroid gland: Secondary | ICD-10-CM | POA: Diagnosis not present

## 2015-08-10 DIAGNOSIS — C7800 Secondary malignant neoplasm of unspecified lung: Secondary | ICD-10-CM | POA: Diagnosis not present

## 2015-08-10 DIAGNOSIS — J159 Unspecified bacterial pneumonia: Secondary | ICD-10-CM | POA: Diagnosis not present

## 2015-08-10 DIAGNOSIS — I509 Heart failure, unspecified: Secondary | ICD-10-CM | POA: Diagnosis not present

## 2015-08-13 DIAGNOSIS — C7800 Secondary malignant neoplasm of unspecified lung: Secondary | ICD-10-CM | POA: Diagnosis not present

## 2015-08-13 DIAGNOSIS — R102 Pelvic and perineal pain: Secondary | ICD-10-CM | POA: Diagnosis not present

## 2015-08-13 DIAGNOSIS — C73 Malignant neoplasm of thyroid gland: Secondary | ICD-10-CM | POA: Diagnosis not present

## 2015-08-13 DIAGNOSIS — R1900 Intra-abdominal and pelvic swelling, mass and lump, unspecified site: Secondary | ICD-10-CM | POA: Diagnosis not present

## 2015-08-13 DIAGNOSIS — F329 Major depressive disorder, single episode, unspecified: Secondary | ICD-10-CM | POA: Diagnosis not present

## 2015-08-13 DIAGNOSIS — R935 Abnormal findings on diagnostic imaging of other abdominal regions, including retroperitoneum: Secondary | ICD-10-CM | POA: Diagnosis not present

## 2015-08-13 DIAGNOSIS — I509 Heart failure, unspecified: Secondary | ICD-10-CM | POA: Diagnosis not present

## 2015-08-13 DIAGNOSIS — J159 Unspecified bacterial pneumonia: Secondary | ICD-10-CM | POA: Diagnosis not present

## 2015-08-13 DIAGNOSIS — F411 Generalized anxiety disorder: Secondary | ICD-10-CM | POA: Diagnosis not present

## 2015-08-18 DIAGNOSIS — F329 Major depressive disorder, single episode, unspecified: Secondary | ICD-10-CM | POA: Diagnosis not present

## 2015-08-18 DIAGNOSIS — J159 Unspecified bacterial pneumonia: Secondary | ICD-10-CM | POA: Diagnosis not present

## 2015-08-18 DIAGNOSIS — C7800 Secondary malignant neoplasm of unspecified lung: Secondary | ICD-10-CM | POA: Diagnosis not present

## 2015-08-18 DIAGNOSIS — C73 Malignant neoplasm of thyroid gland: Secondary | ICD-10-CM | POA: Diagnosis not present

## 2015-08-18 DIAGNOSIS — F411 Generalized anxiety disorder: Secondary | ICD-10-CM | POA: Diagnosis not present

## 2015-08-18 DIAGNOSIS — I509 Heart failure, unspecified: Secondary | ICD-10-CM | POA: Diagnosis not present

## 2015-08-20 DIAGNOSIS — I509 Heart failure, unspecified: Secondary | ICD-10-CM | POA: Diagnosis not present

## 2015-08-20 DIAGNOSIS — F329 Major depressive disorder, single episode, unspecified: Secondary | ICD-10-CM | POA: Diagnosis not present

## 2015-08-20 DIAGNOSIS — J159 Unspecified bacterial pneumonia: Secondary | ICD-10-CM | POA: Diagnosis not present

## 2015-08-20 DIAGNOSIS — C73 Malignant neoplasm of thyroid gland: Secondary | ICD-10-CM | POA: Diagnosis not present

## 2015-08-20 DIAGNOSIS — C7800 Secondary malignant neoplasm of unspecified lung: Secondary | ICD-10-CM | POA: Diagnosis not present

## 2015-08-20 DIAGNOSIS — F411 Generalized anxiety disorder: Secondary | ICD-10-CM | POA: Diagnosis not present

## 2015-08-24 DIAGNOSIS — I509 Heart failure, unspecified: Secondary | ICD-10-CM | POA: Diagnosis not present

## 2015-08-24 DIAGNOSIS — C7800 Secondary malignant neoplasm of unspecified lung: Secondary | ICD-10-CM | POA: Diagnosis not present

## 2015-08-24 DIAGNOSIS — F411 Generalized anxiety disorder: Secondary | ICD-10-CM | POA: Diagnosis not present

## 2015-08-24 DIAGNOSIS — F329 Major depressive disorder, single episode, unspecified: Secondary | ICD-10-CM | POA: Diagnosis not present

## 2015-08-24 DIAGNOSIS — J159 Unspecified bacterial pneumonia: Secondary | ICD-10-CM | POA: Diagnosis not present

## 2015-08-24 DIAGNOSIS — C73 Malignant neoplasm of thyroid gland: Secondary | ICD-10-CM | POA: Diagnosis not present

## 2015-08-27 DIAGNOSIS — F329 Major depressive disorder, single episode, unspecified: Secondary | ICD-10-CM | POA: Diagnosis not present

## 2015-08-27 DIAGNOSIS — C73 Malignant neoplasm of thyroid gland: Secondary | ICD-10-CM | POA: Diagnosis not present

## 2015-08-27 DIAGNOSIS — C7800 Secondary malignant neoplasm of unspecified lung: Secondary | ICD-10-CM | POA: Diagnosis not present

## 2015-08-27 DIAGNOSIS — F411 Generalized anxiety disorder: Secondary | ICD-10-CM | POA: Diagnosis not present

## 2015-08-27 DIAGNOSIS — I509 Heart failure, unspecified: Secondary | ICD-10-CM | POA: Diagnosis not present

## 2015-08-27 DIAGNOSIS — J159 Unspecified bacterial pneumonia: Secondary | ICD-10-CM | POA: Diagnosis not present

## 2015-09-01 DIAGNOSIS — J159 Unspecified bacterial pneumonia: Secondary | ICD-10-CM | POA: Diagnosis not present

## 2015-09-01 DIAGNOSIS — C73 Malignant neoplasm of thyroid gland: Secondary | ICD-10-CM | POA: Diagnosis not present

## 2015-09-01 DIAGNOSIS — I509 Heart failure, unspecified: Secondary | ICD-10-CM | POA: Diagnosis not present

## 2015-09-01 DIAGNOSIS — C7800 Secondary malignant neoplasm of unspecified lung: Secondary | ICD-10-CM | POA: Diagnosis not present

## 2015-09-01 DIAGNOSIS — F329 Major depressive disorder, single episode, unspecified: Secondary | ICD-10-CM | POA: Diagnosis not present

## 2015-09-01 DIAGNOSIS — F411 Generalized anxiety disorder: Secondary | ICD-10-CM | POA: Diagnosis not present

## 2015-09-03 DIAGNOSIS — C73 Malignant neoplasm of thyroid gland: Secondary | ICD-10-CM | POA: Diagnosis not present

## 2015-09-03 DIAGNOSIS — K449 Diaphragmatic hernia without obstruction or gangrene: Secondary | ICD-10-CM | POA: Diagnosis not present

## 2015-09-03 DIAGNOSIS — J189 Pneumonia, unspecified organism: Secondary | ICD-10-CM | POA: Diagnosis not present

## 2015-09-03 DIAGNOSIS — Z853 Personal history of malignant neoplasm of breast: Secondary | ICD-10-CM | POA: Diagnosis not present

## 2015-09-03 DIAGNOSIS — E209 Hypoparathyroidism, unspecified: Secondary | ICD-10-CM | POA: Diagnosis not present

## 2015-09-03 DIAGNOSIS — K219 Gastro-esophageal reflux disease without esophagitis: Secondary | ICD-10-CM | POA: Diagnosis not present

## 2015-09-03 DIAGNOSIS — R1900 Intra-abdominal and pelvic swelling, mass and lump, unspecified site: Secondary | ICD-10-CM | POA: Diagnosis not present

## 2015-09-03 DIAGNOSIS — K5909 Other constipation: Secondary | ICD-10-CM | POA: Diagnosis not present

## 2015-09-03 DIAGNOSIS — E039 Hypothyroidism, unspecified: Secondary | ICD-10-CM | POA: Diagnosis not present

## 2015-09-03 DIAGNOSIS — D509 Iron deficiency anemia, unspecified: Secondary | ICD-10-CM | POA: Diagnosis not present

## 2015-09-03 DIAGNOSIS — K259 Gastric ulcer, unspecified as acute or chronic, without hemorrhage or perforation: Secondary | ICD-10-CM | POA: Diagnosis not present

## 2015-09-03 DIAGNOSIS — M1990 Unspecified osteoarthritis, unspecified site: Secondary | ICD-10-CM | POA: Diagnosis not present

## 2015-09-08 DIAGNOSIS — F329 Major depressive disorder, single episode, unspecified: Secondary | ICD-10-CM | POA: Diagnosis not present

## 2015-09-08 DIAGNOSIS — C7800 Secondary malignant neoplasm of unspecified lung: Secondary | ICD-10-CM | POA: Diagnosis not present

## 2015-09-08 DIAGNOSIS — I509 Heart failure, unspecified: Secondary | ICD-10-CM | POA: Diagnosis not present

## 2015-09-08 DIAGNOSIS — C73 Malignant neoplasm of thyroid gland: Secondary | ICD-10-CM | POA: Diagnosis not present

## 2015-09-08 DIAGNOSIS — F411 Generalized anxiety disorder: Secondary | ICD-10-CM | POA: Diagnosis not present

## 2015-09-08 DIAGNOSIS — J159 Unspecified bacterial pneumonia: Secondary | ICD-10-CM | POA: Diagnosis not present

## 2015-09-14 DIAGNOSIS — R251 Tremor, unspecified: Secondary | ICD-10-CM | POA: Diagnosis not present

## 2015-09-15 DIAGNOSIS — C73 Malignant neoplasm of thyroid gland: Secondary | ICD-10-CM | POA: Diagnosis not present

## 2015-09-15 DIAGNOSIS — F329 Major depressive disorder, single episode, unspecified: Secondary | ICD-10-CM | POA: Diagnosis not present

## 2015-09-15 DIAGNOSIS — J159 Unspecified bacterial pneumonia: Secondary | ICD-10-CM | POA: Diagnosis not present

## 2015-09-15 DIAGNOSIS — C7800 Secondary malignant neoplasm of unspecified lung: Secondary | ICD-10-CM | POA: Diagnosis not present

## 2015-09-15 DIAGNOSIS — F411 Generalized anxiety disorder: Secondary | ICD-10-CM | POA: Diagnosis not present

## 2015-09-15 DIAGNOSIS — I509 Heart failure, unspecified: Secondary | ICD-10-CM | POA: Diagnosis not present

## 2015-09-27 DIAGNOSIS — R0602 Shortness of breath: Secondary | ICD-10-CM | POA: Diagnosis not present

## 2015-09-30 DIAGNOSIS — J159 Unspecified bacterial pneumonia: Secondary | ICD-10-CM | POA: Diagnosis not present

## 2015-09-30 DIAGNOSIS — C73 Malignant neoplasm of thyroid gland: Secondary | ICD-10-CM | POA: Diagnosis not present

## 2015-09-30 DIAGNOSIS — C7800 Secondary malignant neoplasm of unspecified lung: Secondary | ICD-10-CM | POA: Diagnosis not present

## 2015-09-30 DIAGNOSIS — F329 Major depressive disorder, single episode, unspecified: Secondary | ICD-10-CM | POA: Diagnosis not present

## 2015-09-30 DIAGNOSIS — F411 Generalized anxiety disorder: Secondary | ICD-10-CM | POA: Diagnosis not present

## 2015-09-30 DIAGNOSIS — I509 Heart failure, unspecified: Secondary | ICD-10-CM | POA: Diagnosis not present

## 2015-10-05 DIAGNOSIS — F0150 Vascular dementia without behavioral disturbance: Secondary | ICD-10-CM | POA: Diagnosis not present

## 2015-10-05 DIAGNOSIS — I482 Chronic atrial fibrillation: Secondary | ICD-10-CM | POA: Diagnosis not present

## 2015-10-05 DIAGNOSIS — Z8701 Personal history of pneumonia (recurrent): Secondary | ICD-10-CM | POA: Diagnosis not present

## 2015-10-05 DIAGNOSIS — F329 Major depressive disorder, single episode, unspecified: Secondary | ICD-10-CM | POA: Diagnosis not present

## 2015-10-05 DIAGNOSIS — M545 Low back pain: Secondary | ICD-10-CM | POA: Diagnosis not present

## 2015-10-05 DIAGNOSIS — Z853 Personal history of malignant neoplasm of breast: Secondary | ICD-10-CM | POA: Diagnosis not present

## 2015-10-05 DIAGNOSIS — Z452 Encounter for adjustment and management of vascular access device: Secondary | ICD-10-CM | POA: Diagnosis not present

## 2015-10-05 DIAGNOSIS — M1990 Unspecified osteoarthritis, unspecified site: Secondary | ICD-10-CM | POA: Diagnosis not present

## 2015-10-05 DIAGNOSIS — I509 Heart failure, unspecified: Secondary | ICD-10-CM | POA: Diagnosis not present

## 2015-10-05 DIAGNOSIS — G43919 Migraine, unspecified, intractable, without status migrainosus: Secondary | ICD-10-CM | POA: Diagnosis not present

## 2015-10-05 DIAGNOSIS — G40309 Generalized idiopathic epilepsy and epileptic syndromes, not intractable, without status epilepticus: Secondary | ICD-10-CM | POA: Diagnosis not present

## 2015-10-05 DIAGNOSIS — C73 Malignant neoplasm of thyroid gland: Secondary | ICD-10-CM | POA: Diagnosis not present

## 2015-10-05 DIAGNOSIS — C7800 Secondary malignant neoplasm of unspecified lung: Secondary | ICD-10-CM | POA: Diagnosis not present

## 2015-10-13 DIAGNOSIS — M545 Low back pain: Secondary | ICD-10-CM | POA: Diagnosis not present

## 2015-10-13 DIAGNOSIS — C7800 Secondary malignant neoplasm of unspecified lung: Secondary | ICD-10-CM | POA: Diagnosis not present

## 2015-10-13 DIAGNOSIS — Z452 Encounter for adjustment and management of vascular access device: Secondary | ICD-10-CM | POA: Diagnosis not present

## 2015-10-13 DIAGNOSIS — C73 Malignant neoplasm of thyroid gland: Secondary | ICD-10-CM | POA: Diagnosis not present

## 2015-10-13 DIAGNOSIS — F329 Major depressive disorder, single episode, unspecified: Secondary | ICD-10-CM | POA: Diagnosis not present

## 2015-10-13 DIAGNOSIS — I509 Heart failure, unspecified: Secondary | ICD-10-CM | POA: Diagnosis not present

## 2015-10-20 DIAGNOSIS — H538 Other visual disturbances: Secondary | ICD-10-CM | POA: Diagnosis not present

## 2015-10-20 DIAGNOSIS — H2513 Age-related nuclear cataract, bilateral: Secondary | ICD-10-CM | POA: Diagnosis not present

## 2015-11-10 DIAGNOSIS — I509 Heart failure, unspecified: Secondary | ICD-10-CM | POA: Diagnosis not present

## 2015-11-10 DIAGNOSIS — C7800 Secondary malignant neoplasm of unspecified lung: Secondary | ICD-10-CM | POA: Diagnosis not present

## 2015-11-10 DIAGNOSIS — C73 Malignant neoplasm of thyroid gland: Secondary | ICD-10-CM | POA: Diagnosis not present

## 2015-11-10 DIAGNOSIS — Z452 Encounter for adjustment and management of vascular access device: Secondary | ICD-10-CM | POA: Diagnosis not present

## 2015-11-10 DIAGNOSIS — M545 Low back pain: Secondary | ICD-10-CM | POA: Diagnosis not present

## 2015-11-10 DIAGNOSIS — F329 Major depressive disorder, single episode, unspecified: Secondary | ICD-10-CM | POA: Diagnosis not present

## 2015-11-23 DIAGNOSIS — J209 Acute bronchitis, unspecified: Secondary | ICD-10-CM | POA: Diagnosis not present

## 2015-12-03 DIAGNOSIS — C7800 Secondary malignant neoplasm of unspecified lung: Secondary | ICD-10-CM | POA: Diagnosis not present

## 2015-12-03 DIAGNOSIS — C73 Malignant neoplasm of thyroid gland: Secondary | ICD-10-CM | POA: Diagnosis not present

## 2015-12-03 DIAGNOSIS — F329 Major depressive disorder, single episode, unspecified: Secondary | ICD-10-CM | POA: Diagnosis not present

## 2015-12-03 DIAGNOSIS — M545 Low back pain: Secondary | ICD-10-CM | POA: Diagnosis not present

## 2015-12-03 DIAGNOSIS — I509 Heart failure, unspecified: Secondary | ICD-10-CM | POA: Diagnosis not present

## 2015-12-03 DIAGNOSIS — Z452 Encounter for adjustment and management of vascular access device: Secondary | ICD-10-CM | POA: Diagnosis not present

## 2015-12-04 DIAGNOSIS — M1990 Unspecified osteoarthritis, unspecified site: Secondary | ICD-10-CM | POA: Diagnosis not present

## 2015-12-04 DIAGNOSIS — I509 Heart failure, unspecified: Secondary | ICD-10-CM | POA: Diagnosis not present

## 2015-12-04 DIAGNOSIS — F0150 Vascular dementia without behavioral disturbance: Secondary | ICD-10-CM | POA: Diagnosis not present

## 2015-12-04 DIAGNOSIS — I482 Chronic atrial fibrillation: Secondary | ICD-10-CM | POA: Diagnosis not present

## 2015-12-04 DIAGNOSIS — G40309 Generalized idiopathic epilepsy and epileptic syndromes, not intractable, without status epilepticus: Secondary | ICD-10-CM | POA: Diagnosis not present

## 2015-12-04 DIAGNOSIS — C7800 Secondary malignant neoplasm of unspecified lung: Secondary | ICD-10-CM | POA: Diagnosis not present

## 2015-12-04 DIAGNOSIS — Z853 Personal history of malignant neoplasm of breast: Secondary | ICD-10-CM | POA: Diagnosis not present

## 2015-12-04 DIAGNOSIS — F329 Major depressive disorder, single episode, unspecified: Secondary | ICD-10-CM | POA: Diagnosis not present

## 2015-12-04 DIAGNOSIS — Z452 Encounter for adjustment and management of vascular access device: Secondary | ICD-10-CM | POA: Diagnosis not present

## 2015-12-04 DIAGNOSIS — G43919 Migraine, unspecified, intractable, without status migrainosus: Secondary | ICD-10-CM | POA: Diagnosis not present

## 2015-12-04 DIAGNOSIS — Z7901 Long term (current) use of anticoagulants: Secondary | ICD-10-CM | POA: Diagnosis not present

## 2015-12-04 DIAGNOSIS — C73 Malignant neoplasm of thyroid gland: Secondary | ICD-10-CM | POA: Diagnosis not present

## 2015-12-04 DIAGNOSIS — Z8701 Personal history of pneumonia (recurrent): Secondary | ICD-10-CM | POA: Diagnosis not present

## 2015-12-09 DIAGNOSIS — I509 Heart failure, unspecified: Secondary | ICD-10-CM | POA: Diagnosis not present

## 2015-12-09 DIAGNOSIS — Z452 Encounter for adjustment and management of vascular access device: Secondary | ICD-10-CM | POA: Diagnosis not present

## 2015-12-09 DIAGNOSIS — C73 Malignant neoplasm of thyroid gland: Secondary | ICD-10-CM | POA: Diagnosis not present

## 2015-12-09 DIAGNOSIS — C7800 Secondary malignant neoplasm of unspecified lung: Secondary | ICD-10-CM | POA: Diagnosis not present

## 2015-12-09 DIAGNOSIS — I482 Chronic atrial fibrillation: Secondary | ICD-10-CM | POA: Diagnosis not present

## 2015-12-09 DIAGNOSIS — F0150 Vascular dementia without behavioral disturbance: Secondary | ICD-10-CM | POA: Diagnosis not present

## 2016-01-05 DIAGNOSIS — I482 Chronic atrial fibrillation: Secondary | ICD-10-CM | POA: Diagnosis not present

## 2016-01-05 DIAGNOSIS — Z452 Encounter for adjustment and management of vascular access device: Secondary | ICD-10-CM | POA: Diagnosis not present

## 2016-01-05 DIAGNOSIS — I509 Heart failure, unspecified: Secondary | ICD-10-CM | POA: Diagnosis not present

## 2016-01-05 DIAGNOSIS — C73 Malignant neoplasm of thyroid gland: Secondary | ICD-10-CM | POA: Diagnosis not present

## 2016-01-05 DIAGNOSIS — F0150 Vascular dementia without behavioral disturbance: Secondary | ICD-10-CM | POA: Diagnosis not present

## 2016-01-05 DIAGNOSIS — C7800 Secondary malignant neoplasm of unspecified lung: Secondary | ICD-10-CM | POA: Diagnosis not present

## 2016-01-10 DIAGNOSIS — I482 Chronic atrial fibrillation: Secondary | ICD-10-CM | POA: Diagnosis not present

## 2016-01-10 DIAGNOSIS — C73 Malignant neoplasm of thyroid gland: Secondary | ICD-10-CM | POA: Diagnosis not present

## 2016-01-10 DIAGNOSIS — C7800 Secondary malignant neoplasm of unspecified lung: Secondary | ICD-10-CM | POA: Diagnosis not present

## 2016-01-10 DIAGNOSIS — F0150 Vascular dementia without behavioral disturbance: Secondary | ICD-10-CM | POA: Diagnosis not present

## 2016-01-10 DIAGNOSIS — Z452 Encounter for adjustment and management of vascular access device: Secondary | ICD-10-CM | POA: Diagnosis not present

## 2016-01-10 DIAGNOSIS — I509 Heart failure, unspecified: Secondary | ICD-10-CM | POA: Diagnosis not present

## 2016-01-12 DIAGNOSIS — I509 Heart failure, unspecified: Secondary | ICD-10-CM | POA: Diagnosis not present

## 2016-01-12 DIAGNOSIS — C73 Malignant neoplasm of thyroid gland: Secondary | ICD-10-CM | POA: Diagnosis not present

## 2016-01-12 DIAGNOSIS — I482 Chronic atrial fibrillation: Secondary | ICD-10-CM | POA: Diagnosis not present

## 2016-01-12 DIAGNOSIS — Z452 Encounter for adjustment and management of vascular access device: Secondary | ICD-10-CM | POA: Diagnosis not present

## 2016-01-12 DIAGNOSIS — F0150 Vascular dementia without behavioral disturbance: Secondary | ICD-10-CM | POA: Diagnosis not present

## 2016-01-12 DIAGNOSIS — C7800 Secondary malignant neoplasm of unspecified lung: Secondary | ICD-10-CM | POA: Diagnosis not present

## 2016-01-14 DIAGNOSIS — F0150 Vascular dementia without behavioral disturbance: Secondary | ICD-10-CM | POA: Diagnosis not present

## 2016-01-14 DIAGNOSIS — C73 Malignant neoplasm of thyroid gland: Secondary | ICD-10-CM | POA: Diagnosis not present

## 2016-01-14 DIAGNOSIS — Z452 Encounter for adjustment and management of vascular access device: Secondary | ICD-10-CM | POA: Diagnosis not present

## 2016-01-14 DIAGNOSIS — I509 Heart failure, unspecified: Secondary | ICD-10-CM | POA: Diagnosis not present

## 2016-01-14 DIAGNOSIS — C7800 Secondary malignant neoplasm of unspecified lung: Secondary | ICD-10-CM | POA: Diagnosis not present

## 2016-01-14 DIAGNOSIS — I482 Chronic atrial fibrillation: Secondary | ICD-10-CM | POA: Diagnosis not present

## 2016-01-17 DIAGNOSIS — C7800 Secondary malignant neoplasm of unspecified lung: Secondary | ICD-10-CM | POA: Diagnosis not present

## 2016-01-17 DIAGNOSIS — I509 Heart failure, unspecified: Secondary | ICD-10-CM | POA: Diagnosis not present

## 2016-01-17 DIAGNOSIS — Z452 Encounter for adjustment and management of vascular access device: Secondary | ICD-10-CM | POA: Diagnosis not present

## 2016-01-17 DIAGNOSIS — I482 Chronic atrial fibrillation: Secondary | ICD-10-CM | POA: Diagnosis not present

## 2016-01-17 DIAGNOSIS — C73 Malignant neoplasm of thyroid gland: Secondary | ICD-10-CM | POA: Diagnosis not present

## 2016-01-17 DIAGNOSIS — F0150 Vascular dementia without behavioral disturbance: Secondary | ICD-10-CM | POA: Diagnosis not present

## 2016-01-18 DIAGNOSIS — I482 Chronic atrial fibrillation: Secondary | ICD-10-CM | POA: Diagnosis not present

## 2016-01-18 DIAGNOSIS — F0150 Vascular dementia without behavioral disturbance: Secondary | ICD-10-CM | POA: Diagnosis not present

## 2016-01-18 DIAGNOSIS — C73 Malignant neoplasm of thyroid gland: Secondary | ICD-10-CM | POA: Diagnosis not present

## 2016-01-18 DIAGNOSIS — Z452 Encounter for adjustment and management of vascular access device: Secondary | ICD-10-CM | POA: Diagnosis not present

## 2016-01-18 DIAGNOSIS — C7800 Secondary malignant neoplasm of unspecified lung: Secondary | ICD-10-CM | POA: Diagnosis not present

## 2016-01-18 DIAGNOSIS — I509 Heart failure, unspecified: Secondary | ICD-10-CM | POA: Diagnosis not present

## 2016-01-21 DIAGNOSIS — I509 Heart failure, unspecified: Secondary | ICD-10-CM | POA: Diagnosis not present

## 2016-01-21 DIAGNOSIS — C73 Malignant neoplasm of thyroid gland: Secondary | ICD-10-CM | POA: Diagnosis not present

## 2016-01-21 DIAGNOSIS — I482 Chronic atrial fibrillation: Secondary | ICD-10-CM | POA: Diagnosis not present

## 2016-01-21 DIAGNOSIS — F0150 Vascular dementia without behavioral disturbance: Secondary | ICD-10-CM | POA: Diagnosis not present

## 2016-01-21 DIAGNOSIS — C7800 Secondary malignant neoplasm of unspecified lung: Secondary | ICD-10-CM | POA: Diagnosis not present

## 2016-01-21 DIAGNOSIS — Z452 Encounter for adjustment and management of vascular access device: Secondary | ICD-10-CM | POA: Diagnosis not present

## 2016-01-25 DIAGNOSIS — C7800 Secondary malignant neoplasm of unspecified lung: Secondary | ICD-10-CM | POA: Diagnosis not present

## 2016-01-25 DIAGNOSIS — F0150 Vascular dementia without behavioral disturbance: Secondary | ICD-10-CM | POA: Diagnosis not present

## 2016-01-25 DIAGNOSIS — I482 Chronic atrial fibrillation: Secondary | ICD-10-CM | POA: Diagnosis not present

## 2016-01-25 DIAGNOSIS — Z452 Encounter for adjustment and management of vascular access device: Secondary | ICD-10-CM | POA: Diagnosis not present

## 2016-01-25 DIAGNOSIS — I509 Heart failure, unspecified: Secondary | ICD-10-CM | POA: Diagnosis not present

## 2016-01-25 DIAGNOSIS — C73 Malignant neoplasm of thyroid gland: Secondary | ICD-10-CM | POA: Diagnosis not present

## 2016-01-26 DIAGNOSIS — Z452 Encounter for adjustment and management of vascular access device: Secondary | ICD-10-CM | POA: Diagnosis not present

## 2016-01-26 DIAGNOSIS — F0150 Vascular dementia without behavioral disturbance: Secondary | ICD-10-CM | POA: Diagnosis not present

## 2016-01-26 DIAGNOSIS — C7800 Secondary malignant neoplasm of unspecified lung: Secondary | ICD-10-CM | POA: Diagnosis not present

## 2016-01-26 DIAGNOSIS — C73 Malignant neoplasm of thyroid gland: Secondary | ICD-10-CM | POA: Diagnosis not present

## 2016-01-26 DIAGNOSIS — I509 Heart failure, unspecified: Secondary | ICD-10-CM | POA: Diagnosis not present

## 2016-01-26 DIAGNOSIS — I482 Chronic atrial fibrillation: Secondary | ICD-10-CM | POA: Diagnosis not present

## 2016-01-28 DIAGNOSIS — I482 Chronic atrial fibrillation: Secondary | ICD-10-CM | POA: Diagnosis not present

## 2016-01-28 DIAGNOSIS — F0150 Vascular dementia without behavioral disturbance: Secondary | ICD-10-CM | POA: Diagnosis not present

## 2016-01-28 DIAGNOSIS — C73 Malignant neoplasm of thyroid gland: Secondary | ICD-10-CM | POA: Diagnosis not present

## 2016-01-28 DIAGNOSIS — Z452 Encounter for adjustment and management of vascular access device: Secondary | ICD-10-CM | POA: Diagnosis not present

## 2016-01-28 DIAGNOSIS — I509 Heart failure, unspecified: Secondary | ICD-10-CM | POA: Diagnosis not present

## 2016-01-28 DIAGNOSIS — C7800 Secondary malignant neoplasm of unspecified lung: Secondary | ICD-10-CM | POA: Diagnosis not present

## 2016-02-01 DIAGNOSIS — I509 Heart failure, unspecified: Secondary | ICD-10-CM | POA: Diagnosis not present

## 2016-02-01 DIAGNOSIS — C73 Malignant neoplasm of thyroid gland: Secondary | ICD-10-CM | POA: Diagnosis not present

## 2016-02-01 DIAGNOSIS — F0150 Vascular dementia without behavioral disturbance: Secondary | ICD-10-CM | POA: Diagnosis not present

## 2016-02-01 DIAGNOSIS — C7800 Secondary malignant neoplasm of unspecified lung: Secondary | ICD-10-CM | POA: Diagnosis not present

## 2016-02-01 DIAGNOSIS — Z452 Encounter for adjustment and management of vascular access device: Secondary | ICD-10-CM | POA: Diagnosis not present

## 2016-02-01 DIAGNOSIS — I482 Chronic atrial fibrillation: Secondary | ICD-10-CM | POA: Diagnosis not present

## 2016-02-02 DIAGNOSIS — M25512 Pain in left shoulder: Secondary | ICD-10-CM | POA: Diagnosis not present

## 2016-02-02 DIAGNOSIS — I482 Chronic atrial fibrillation: Secondary | ICD-10-CM | POA: Diagnosis not present

## 2016-02-02 DIAGNOSIS — G43919 Migraine, unspecified, intractable, without status migrainosus: Secondary | ICD-10-CM | POA: Diagnosis not present

## 2016-02-02 DIAGNOSIS — Z8701 Personal history of pneumonia (recurrent): Secondary | ICD-10-CM | POA: Diagnosis not present

## 2016-02-02 DIAGNOSIS — Z853 Personal history of malignant neoplasm of breast: Secondary | ICD-10-CM | POA: Diagnosis not present

## 2016-02-02 DIAGNOSIS — M1990 Unspecified osteoarthritis, unspecified site: Secondary | ICD-10-CM | POA: Diagnosis not present

## 2016-02-02 DIAGNOSIS — Z452 Encounter for adjustment and management of vascular access device: Secondary | ICD-10-CM | POA: Diagnosis not present

## 2016-02-02 DIAGNOSIS — F329 Major depressive disorder, single episode, unspecified: Secondary | ICD-10-CM | POA: Diagnosis not present

## 2016-02-02 DIAGNOSIS — F0150 Vascular dementia without behavioral disturbance: Secondary | ICD-10-CM | POA: Diagnosis not present

## 2016-02-02 DIAGNOSIS — C7800 Secondary malignant neoplasm of unspecified lung: Secondary | ICD-10-CM | POA: Diagnosis not present

## 2016-02-02 DIAGNOSIS — C73 Malignant neoplasm of thyroid gland: Secondary | ICD-10-CM | POA: Diagnosis not present

## 2016-02-02 DIAGNOSIS — G40309 Generalized idiopathic epilepsy and epileptic syndromes, not intractable, without status epilepticus: Secondary | ICD-10-CM | POA: Diagnosis not present

## 2016-02-02 DIAGNOSIS — I509 Heart failure, unspecified: Secondary | ICD-10-CM | POA: Diagnosis not present

## 2016-02-02 DIAGNOSIS — Z7901 Long term (current) use of anticoagulants: Secondary | ICD-10-CM | POA: Diagnosis not present

## 2016-02-29 DIAGNOSIS — F0150 Vascular dementia without behavioral disturbance: Secondary | ICD-10-CM | POA: Diagnosis not present

## 2016-02-29 DIAGNOSIS — M25512 Pain in left shoulder: Secondary | ICD-10-CM | POA: Diagnosis not present

## 2016-02-29 DIAGNOSIS — C73 Malignant neoplasm of thyroid gland: Secondary | ICD-10-CM | POA: Diagnosis not present

## 2016-02-29 DIAGNOSIS — Z452 Encounter for adjustment and management of vascular access device: Secondary | ICD-10-CM | POA: Diagnosis not present

## 2016-02-29 DIAGNOSIS — I509 Heart failure, unspecified: Secondary | ICD-10-CM | POA: Diagnosis not present

## 2016-02-29 DIAGNOSIS — C7800 Secondary malignant neoplasm of unspecified lung: Secondary | ICD-10-CM | POA: Diagnosis not present

## 2016-03-20 DIAGNOSIS — R0789 Other chest pain: Secondary | ICD-10-CM | POA: Diagnosis not present

## 2016-03-20 DIAGNOSIS — Z885 Allergy status to narcotic agent status: Secondary | ICD-10-CM | POA: Diagnosis not present

## 2016-03-20 DIAGNOSIS — I200 Unstable angina: Secondary | ICD-10-CM | POA: Diagnosis not present

## 2016-03-20 DIAGNOSIS — D649 Anemia, unspecified: Secondary | ICD-10-CM | POA: Diagnosis not present

## 2016-03-20 DIAGNOSIS — I340 Nonrheumatic mitral (valve) insufficiency: Secondary | ICD-10-CM | POA: Diagnosis not present

## 2016-03-20 DIAGNOSIS — K219 Gastro-esophageal reflux disease without esophagitis: Secondary | ICD-10-CM | POA: Diagnosis not present

## 2016-03-20 DIAGNOSIS — Z853 Personal history of malignant neoplasm of breast: Secondary | ICD-10-CM | POA: Diagnosis not present

## 2016-03-20 DIAGNOSIS — Z882 Allergy status to sulfonamides status: Secondary | ICD-10-CM | POA: Diagnosis not present

## 2016-03-20 DIAGNOSIS — R011 Cardiac murmur, unspecified: Secondary | ICD-10-CM | POA: Diagnosis not present

## 2016-03-20 DIAGNOSIS — Z7982 Long term (current) use of aspirin: Secondary | ICD-10-CM | POA: Diagnosis not present

## 2016-03-20 DIAGNOSIS — Z6831 Body mass index (BMI) 31.0-31.9, adult: Secondary | ICD-10-CM | POA: Diagnosis not present

## 2016-03-20 DIAGNOSIS — E892 Postprocedural hypoparathyroidism: Secondary | ICD-10-CM | POA: Diagnosis not present

## 2016-03-20 DIAGNOSIS — R0602 Shortness of breath: Secondary | ICD-10-CM | POA: Diagnosis not present

## 2016-03-20 DIAGNOSIS — I4891 Unspecified atrial fibrillation: Secondary | ICD-10-CM | POA: Diagnosis not present

## 2016-03-20 DIAGNOSIS — I2510 Atherosclerotic heart disease of native coronary artery without angina pectoris: Secondary | ICD-10-CM | POA: Diagnosis not present

## 2016-03-20 DIAGNOSIS — R079 Chest pain, unspecified: Secondary | ICD-10-CM | POA: Diagnosis not present

## 2016-03-20 DIAGNOSIS — Z9011 Acquired absence of right breast and nipple: Secondary | ICD-10-CM | POA: Diagnosis not present

## 2016-03-20 DIAGNOSIS — Z881 Allergy status to other antibiotic agents status: Secondary | ICD-10-CM | POA: Diagnosis not present

## 2016-03-20 DIAGNOSIS — Z888 Allergy status to other drugs, medicaments and biological substances status: Secondary | ICD-10-CM | POA: Diagnosis not present

## 2016-03-21 DIAGNOSIS — I4891 Unspecified atrial fibrillation: Secondary | ICD-10-CM | POA: Diagnosis not present

## 2016-03-21 DIAGNOSIS — I200 Unstable angina: Secondary | ICD-10-CM | POA: Diagnosis not present

## 2016-03-21 DIAGNOSIS — R079 Chest pain, unspecified: Secondary | ICD-10-CM | POA: Diagnosis not present

## 2016-03-28 DIAGNOSIS — E890 Postprocedural hypothyroidism: Secondary | ICD-10-CM | POA: Diagnosis not present

## 2016-03-28 DIAGNOSIS — G8114 Spastic hemiplegia affecting left nondominant side: Secondary | ICD-10-CM | POA: Diagnosis not present

## 2016-03-28 DIAGNOSIS — Z881 Allergy status to other antibiotic agents status: Secondary | ICD-10-CM | POA: Diagnosis not present

## 2016-03-28 DIAGNOSIS — R6889 Other general symptoms and signs: Secondary | ICD-10-CM | POA: Diagnosis present

## 2016-03-28 DIAGNOSIS — Z85118 Personal history of other malignant neoplasm of bronchus and lung: Secondary | ICD-10-CM | POA: Diagnosis not present

## 2016-03-28 DIAGNOSIS — I6782 Cerebral ischemia: Secondary | ICD-10-CM | POA: Diagnosis not present

## 2016-03-28 DIAGNOSIS — I6789 Other cerebrovascular disease: Secondary | ICD-10-CM | POA: Diagnosis not present

## 2016-03-28 DIAGNOSIS — K219 Gastro-esophageal reflux disease without esophagitis: Secondary | ICD-10-CM | POA: Diagnosis present

## 2016-03-28 DIAGNOSIS — R531 Weakness: Secondary | ICD-10-CM | POA: Diagnosis not present

## 2016-03-28 DIAGNOSIS — I639 Cerebral infarction, unspecified: Secondary | ICD-10-CM | POA: Diagnosis not present

## 2016-03-28 DIAGNOSIS — G8194 Hemiplegia, unspecified affecting left nondominant side: Secondary | ICD-10-CM | POA: Diagnosis not present

## 2016-03-28 DIAGNOSIS — I6350 Cerebral infarction due to unspecified occlusion or stenosis of unspecified cerebral artery: Secondary | ICD-10-CM | POA: Diagnosis not present

## 2016-03-28 DIAGNOSIS — I482 Chronic atrial fibrillation: Secondary | ICD-10-CM | POA: Diagnosis present

## 2016-03-28 DIAGNOSIS — R4182 Altered mental status, unspecified: Secondary | ICD-10-CM | POA: Diagnosis not present

## 2016-03-28 DIAGNOSIS — N390 Urinary tract infection, site not specified: Secondary | ICD-10-CM | POA: Diagnosis not present

## 2016-03-28 DIAGNOSIS — I517 Cardiomegaly: Secondary | ICD-10-CM | POA: Diagnosis not present

## 2016-03-28 DIAGNOSIS — E039 Hypothyroidism, unspecified: Secondary | ICD-10-CM | POA: Diagnosis present

## 2016-03-28 DIAGNOSIS — I63511 Cerebral infarction due to unspecified occlusion or stenosis of right middle cerebral artery: Secondary | ICD-10-CM | POA: Diagnosis present

## 2016-03-28 DIAGNOSIS — M1990 Unspecified osteoarthritis, unspecified site: Secondary | ICD-10-CM | POA: Diagnosis present

## 2016-03-28 DIAGNOSIS — G8190 Hemiplegia, unspecified affecting unspecified side: Secondary | ICD-10-CM | POA: Diagnosis not present

## 2016-03-28 DIAGNOSIS — R079 Chest pain, unspecified: Secondary | ICD-10-CM | POA: Diagnosis not present

## 2016-03-28 DIAGNOSIS — D649 Anemia, unspecified: Secondary | ICD-10-CM | POA: Diagnosis not present

## 2016-03-28 DIAGNOSIS — Z803 Family history of malignant neoplasm of breast: Secondary | ICD-10-CM | POA: Diagnosis not present

## 2016-03-28 DIAGNOSIS — G40909 Epilepsy, unspecified, not intractable, without status epilepticus: Secondary | ICD-10-CM | POA: Diagnosis present

## 2016-03-28 DIAGNOSIS — I63411 Cerebral infarction due to embolism of right middle cerebral artery: Secondary | ICD-10-CM | POA: Diagnosis not present

## 2016-03-28 DIAGNOSIS — Z888 Allergy status to other drugs, medicaments and biological substances status: Secondary | ICD-10-CM | POA: Diagnosis not present

## 2016-03-28 DIAGNOSIS — I358 Other nonrheumatic aortic valve disorders: Secondary | ICD-10-CM | POA: Diagnosis not present

## 2016-03-28 DIAGNOSIS — R251 Tremor, unspecified: Secondary | ICD-10-CM | POA: Diagnosis not present

## 2016-03-28 DIAGNOSIS — Z882 Allergy status to sulfonamides status: Secondary | ICD-10-CM | POA: Diagnosis not present

## 2016-03-28 DIAGNOSIS — Z886 Allergy status to analgesic agent status: Secondary | ICD-10-CM | POA: Diagnosis not present

## 2016-03-28 DIAGNOSIS — R471 Dysarthria and anarthria: Secondary | ICD-10-CM | POA: Diagnosis present

## 2016-03-28 DIAGNOSIS — I4891 Unspecified atrial fibrillation: Secondary | ICD-10-CM | POA: Diagnosis not present

## 2016-03-28 DIAGNOSIS — Z883 Allergy status to other anti-infective agents status: Secondary | ICD-10-CM | POA: Diagnosis not present

## 2016-03-28 DIAGNOSIS — E212 Other hyperparathyroidism: Secondary | ICD-10-CM | POA: Diagnosis present

## 2016-03-28 DIAGNOSIS — R29810 Facial weakness: Secondary | ICD-10-CM | POA: Diagnosis not present

## 2016-03-28 DIAGNOSIS — G4700 Insomnia, unspecified: Secondary | ICD-10-CM | POA: Diagnosis present

## 2016-03-28 DIAGNOSIS — R29711 NIHSS score 11: Secondary | ICD-10-CM | POA: Diagnosis not present

## 2016-03-29 DIAGNOSIS — R05 Cough: Secondary | ICD-10-CM | POA: Diagnosis not present

## 2016-03-29 DIAGNOSIS — Z881 Allergy status to other antibiotic agents status: Secondary | ICD-10-CM | POA: Diagnosis not present

## 2016-03-29 DIAGNOSIS — F333 Major depressive disorder, recurrent, severe with psychotic symptoms: Secondary | ICD-10-CM | POA: Diagnosis not present

## 2016-03-29 DIAGNOSIS — M6281 Muscle weakness (generalized): Secondary | ICD-10-CM | POA: Diagnosis not present

## 2016-03-29 DIAGNOSIS — Z85850 Personal history of malignant neoplasm of thyroid: Secondary | ICD-10-CM | POA: Diagnosis not present

## 2016-03-29 DIAGNOSIS — D649 Anemia, unspecified: Secondary | ICD-10-CM | POA: Diagnosis not present

## 2016-03-29 DIAGNOSIS — R278 Other lack of coordination: Secondary | ICD-10-CM | POA: Diagnosis not present

## 2016-03-29 DIAGNOSIS — G4700 Insomnia, unspecified: Secondary | ICD-10-CM | POA: Diagnosis not present

## 2016-03-29 DIAGNOSIS — Z8719 Personal history of other diseases of the digestive system: Secondary | ICD-10-CM | POA: Diagnosis not present

## 2016-03-29 DIAGNOSIS — K5900 Constipation, unspecified: Secondary | ICD-10-CM | POA: Diagnosis not present

## 2016-03-29 DIAGNOSIS — R2689 Other abnormalities of gait and mobility: Secondary | ICD-10-CM | POA: Diagnosis not present

## 2016-03-29 DIAGNOSIS — I482 Chronic atrial fibrillation: Secondary | ICD-10-CM | POA: Diagnosis present

## 2016-03-29 DIAGNOSIS — Z882 Allergy status to sulfonamides status: Secondary | ICD-10-CM | POA: Diagnosis not present

## 2016-03-29 DIAGNOSIS — I499 Cardiac arrhythmia, unspecified: Secondary | ICD-10-CM | POA: Diagnosis not present

## 2016-03-29 DIAGNOSIS — K922 Gastrointestinal hemorrhage, unspecified: Secondary | ICD-10-CM | POA: Diagnosis not present

## 2016-03-29 DIAGNOSIS — I639 Cerebral infarction, unspecified: Secondary | ICD-10-CM | POA: Diagnosis not present

## 2016-03-29 DIAGNOSIS — E039 Hypothyroidism, unspecified: Secondary | ICD-10-CM | POA: Diagnosis not present

## 2016-03-29 DIAGNOSIS — N390 Urinary tract infection, site not specified: Secondary | ICD-10-CM | POA: Diagnosis present

## 2016-03-29 DIAGNOSIS — E785 Hyperlipidemia, unspecified: Secondary | ICD-10-CM | POA: Diagnosis not present

## 2016-03-29 DIAGNOSIS — E876 Hypokalemia: Secondary | ICD-10-CM | POA: Diagnosis not present

## 2016-03-29 DIAGNOSIS — R1310 Dysphagia, unspecified: Secondary | ICD-10-CM | POA: Diagnosis not present

## 2016-03-29 DIAGNOSIS — Z8673 Personal history of transient ischemic attack (TIA), and cerebral infarction without residual deficits: Secondary | ICD-10-CM | POA: Diagnosis not present

## 2016-03-29 DIAGNOSIS — Z7982 Long term (current) use of aspirin: Secondary | ICD-10-CM | POA: Diagnosis not present

## 2016-03-29 DIAGNOSIS — R079 Chest pain, unspecified: Secondary | ICD-10-CM | POA: Diagnosis not present

## 2016-03-29 DIAGNOSIS — G8194 Hemiplegia, unspecified affecting left nondominant side: Secondary | ICD-10-CM | POA: Diagnosis not present

## 2016-03-29 DIAGNOSIS — F419 Anxiety disorder, unspecified: Secondary | ICD-10-CM | POA: Diagnosis not present

## 2016-03-29 DIAGNOSIS — G2581 Restless legs syndrome: Secondary | ICD-10-CM | POA: Diagnosis not present

## 2016-03-29 DIAGNOSIS — Z79899 Other long term (current) drug therapy: Secondary | ICD-10-CM | POA: Diagnosis not present

## 2016-03-29 DIAGNOSIS — R29710 NIHSS score 10: Secondary | ICD-10-CM | POA: Diagnosis not present

## 2016-03-29 DIAGNOSIS — Z791 Long term (current) use of non-steroidal anti-inflammatories (NSAID): Secondary | ICD-10-CM | POA: Diagnosis not present

## 2016-03-29 DIAGNOSIS — R109 Unspecified abdominal pain: Secondary | ICD-10-CM | POA: Diagnosis not present

## 2016-03-29 DIAGNOSIS — Z888 Allergy status to other drugs, medicaments and biological substances status: Secondary | ICD-10-CM | POA: Diagnosis not present

## 2016-03-29 DIAGNOSIS — R29711 NIHSS score 11: Secondary | ICD-10-CM | POA: Diagnosis present

## 2016-03-29 DIAGNOSIS — G4089 Other seizures: Secondary | ICD-10-CM | POA: Diagnosis not present

## 2016-03-29 DIAGNOSIS — Z85118 Personal history of other malignant neoplasm of bronchus and lung: Secondary | ICD-10-CM | POA: Diagnosis not present

## 2016-03-29 DIAGNOSIS — I63411 Cerebral infarction due to embolism of right middle cerebral artery: Secondary | ICD-10-CM | POA: Diagnosis present

## 2016-03-29 DIAGNOSIS — I517 Cardiomegaly: Secondary | ICD-10-CM | POA: Diagnosis not present

## 2016-03-29 DIAGNOSIS — D500 Iron deficiency anemia secondary to blood loss (chronic): Secondary | ICD-10-CM | POA: Diagnosis present

## 2016-03-29 DIAGNOSIS — E890 Postprocedural hypothyroidism: Secondary | ICD-10-CM | POA: Diagnosis present

## 2016-03-29 DIAGNOSIS — R52 Pain, unspecified: Secondary | ICD-10-CM | POA: Diagnosis not present

## 2016-03-29 DIAGNOSIS — K5909 Other constipation: Secondary | ICD-10-CM | POA: Diagnosis present

## 2016-03-29 DIAGNOSIS — K219 Gastro-esophageal reflux disease without esophagitis: Secondary | ICD-10-CM | POA: Diagnosis present

## 2016-03-29 DIAGNOSIS — G8114 Spastic hemiplegia affecting left nondominant side: Secondary | ICD-10-CM | POA: Diagnosis present

## 2016-03-29 DIAGNOSIS — Z91041 Radiographic dye allergy status: Secondary | ICD-10-CM | POA: Diagnosis not present

## 2016-03-29 DIAGNOSIS — B9561 Methicillin susceptible Staphylococcus aureus infection as the cause of diseases classified elsewhere: Secondary | ICD-10-CM | POA: Diagnosis present

## 2016-03-29 DIAGNOSIS — I340 Nonrheumatic mitral (valve) insufficiency: Secondary | ICD-10-CM | POA: Diagnosis not present

## 2016-03-29 DIAGNOSIS — I4891 Unspecified atrial fibrillation: Secondary | ICD-10-CM | POA: Diagnosis not present

## 2016-04-04 DIAGNOSIS — G4700 Insomnia, unspecified: Secondary | ICD-10-CM | POA: Diagnosis not present

## 2016-04-04 DIAGNOSIS — R05 Cough: Secondary | ICD-10-CM | POA: Diagnosis not present

## 2016-04-04 DIAGNOSIS — K219 Gastro-esophageal reflux disease without esophagitis: Secondary | ICD-10-CM | POA: Diagnosis not present

## 2016-04-04 DIAGNOSIS — M6281 Muscle weakness (generalized): Secondary | ICD-10-CM | POA: Diagnosis not present

## 2016-04-04 DIAGNOSIS — Z8719 Personal history of other diseases of the digestive system: Secondary | ICD-10-CM | POA: Diagnosis not present

## 2016-04-04 DIAGNOSIS — F333 Major depressive disorder, recurrent, severe with psychotic symptoms: Secondary | ICD-10-CM | POA: Diagnosis not present

## 2016-04-04 DIAGNOSIS — E785 Hyperlipidemia, unspecified: Secondary | ICD-10-CM | POA: Diagnosis not present

## 2016-04-04 DIAGNOSIS — E039 Hypothyroidism, unspecified: Secondary | ICD-10-CM | POA: Diagnosis not present

## 2016-04-04 DIAGNOSIS — I63411 Cerebral infarction due to embolism of right middle cerebral artery: Secondary | ICD-10-CM | POA: Diagnosis not present

## 2016-04-04 DIAGNOSIS — I482 Chronic atrial fibrillation: Secondary | ICD-10-CM | POA: Diagnosis not present

## 2016-04-04 DIAGNOSIS — R2689 Other abnormalities of gait and mobility: Secondary | ICD-10-CM | POA: Diagnosis not present

## 2016-04-04 DIAGNOSIS — R278 Other lack of coordination: Secondary | ICD-10-CM | POA: Diagnosis not present

## 2016-04-04 DIAGNOSIS — I4891 Unspecified atrial fibrillation: Secondary | ICD-10-CM | POA: Diagnosis not present

## 2016-04-04 DIAGNOSIS — R52 Pain, unspecified: Secondary | ICD-10-CM | POA: Diagnosis not present

## 2016-04-04 DIAGNOSIS — G8194 Hemiplegia, unspecified affecting left nondominant side: Secondary | ICD-10-CM | POA: Diagnosis not present

## 2016-04-04 DIAGNOSIS — I639 Cerebral infarction, unspecified: Secondary | ICD-10-CM | POA: Diagnosis not present

## 2016-04-04 DIAGNOSIS — G2581 Restless legs syndrome: Secondary | ICD-10-CM | POA: Diagnosis not present

## 2016-04-04 DIAGNOSIS — D649 Anemia, unspecified: Secondary | ICD-10-CM | POA: Diagnosis not present

## 2016-04-04 DIAGNOSIS — G4089 Other seizures: Secondary | ICD-10-CM | POA: Diagnosis not present

## 2016-04-18 ENCOUNTER — Encounter: Payer: Medicare Other | Admitting: Cardiology

## 2016-04-18 DIAGNOSIS — I482 Chronic atrial fibrillation: Secondary | ICD-10-CM | POA: Diagnosis not present

## 2016-04-18 DIAGNOSIS — I639 Cerebral infarction, unspecified: Secondary | ICD-10-CM | POA: Diagnosis not present

## 2016-06-09 DIAGNOSIS — I639 Cerebral infarction, unspecified: Secondary | ICD-10-CM | POA: Diagnosis not present

## 2016-06-09 DIAGNOSIS — I482 Chronic atrial fibrillation: Secondary | ICD-10-CM | POA: Diagnosis not present

## 2016-08-05 DIAGNOSIS — I639 Cerebral infarction, unspecified: Secondary | ICD-10-CM | POA: Diagnosis not present

## 2016-08-05 DIAGNOSIS — I482 Chronic atrial fibrillation: Secondary | ICD-10-CM | POA: Diagnosis not present

## 2016-08-23 DIAGNOSIS — M6240 Contracture of muscle, unspecified site: Secondary | ICD-10-CM | POA: Diagnosis not present

## 2016-08-23 DIAGNOSIS — I69954 Hemiplegia and hemiparesis following unspecified cerebrovascular disease affecting left non-dominant side: Secondary | ICD-10-CM | POA: Diagnosis not present

## 2016-08-23 DIAGNOSIS — M6281 Muscle weakness (generalized): Secondary | ICD-10-CM | POA: Diagnosis not present

## 2016-08-23 DIAGNOSIS — R278 Other lack of coordination: Secondary | ICD-10-CM | POA: Diagnosis not present

## 2016-08-25 DIAGNOSIS — M6240 Contracture of muscle, unspecified site: Secondary | ICD-10-CM | POA: Diagnosis not present

## 2016-08-25 DIAGNOSIS — R278 Other lack of coordination: Secondary | ICD-10-CM | POA: Diagnosis not present

## 2016-08-25 DIAGNOSIS — M6281 Muscle weakness (generalized): Secondary | ICD-10-CM | POA: Diagnosis not present

## 2016-08-25 DIAGNOSIS — I69954 Hemiplegia and hemiparesis following unspecified cerebrovascular disease affecting left non-dominant side: Secondary | ICD-10-CM | POA: Diagnosis not present

## 2016-08-27 DIAGNOSIS — Z7902 Long term (current) use of antithrombotics/antiplatelets: Secondary | ICD-10-CM | POA: Diagnosis not present

## 2016-08-27 DIAGNOSIS — Z7401 Bed confinement status: Secondary | ICD-10-CM | POA: Diagnosis not present

## 2016-08-27 DIAGNOSIS — G4489 Other headache syndrome: Secondary | ICD-10-CM | POA: Diagnosis not present

## 2016-08-27 DIAGNOSIS — M1990 Unspecified osteoarthritis, unspecified site: Secondary | ICD-10-CM | POA: Diagnosis not present

## 2016-08-27 DIAGNOSIS — M25512 Pain in left shoulder: Secondary | ICD-10-CM | POA: Diagnosis not present

## 2016-08-27 DIAGNOSIS — R05 Cough: Secondary | ICD-10-CM | POA: Diagnosis not present

## 2016-08-27 DIAGNOSIS — R279 Unspecified lack of coordination: Secondary | ICD-10-CM | POA: Diagnosis not present

## 2016-08-27 DIAGNOSIS — E039 Hypothyroidism, unspecified: Secondary | ICD-10-CM | POA: Diagnosis not present

## 2016-08-27 DIAGNOSIS — F419 Anxiety disorder, unspecified: Secondary | ICD-10-CM | POA: Diagnosis not present

## 2016-08-27 DIAGNOSIS — I4891 Unspecified atrial fibrillation: Secondary | ICD-10-CM | POA: Diagnosis not present

## 2016-08-27 DIAGNOSIS — R0789 Other chest pain: Secondary | ICD-10-CM | POA: Diagnosis not present

## 2016-08-27 DIAGNOSIS — K219 Gastro-esophageal reflux disease without esophagitis: Secondary | ICD-10-CM | POA: Diagnosis not present

## 2016-08-27 DIAGNOSIS — F0390 Unspecified dementia without behavioral disturbance: Secondary | ICD-10-CM | POA: Diagnosis not present

## 2016-08-27 DIAGNOSIS — Z8674 Personal history of sudden cardiac arrest: Secondary | ICD-10-CM | POA: Diagnosis not present

## 2016-08-27 DIAGNOSIS — R079 Chest pain, unspecified: Secondary | ICD-10-CM | POA: Diagnosis not present

## 2016-08-27 DIAGNOSIS — G40909 Epilepsy, unspecified, not intractable, without status epilepticus: Secondary | ICD-10-CM | POA: Diagnosis not present

## 2016-08-27 DIAGNOSIS — Z85118 Personal history of other malignant neoplasm of bronchus and lung: Secondary | ICD-10-CM | POA: Diagnosis not present

## 2016-08-28 DIAGNOSIS — M6281 Muscle weakness (generalized): Secondary | ICD-10-CM | POA: Diagnosis not present

## 2016-08-28 DIAGNOSIS — M6240 Contracture of muscle, unspecified site: Secondary | ICD-10-CM | POA: Diagnosis not present

## 2016-08-28 DIAGNOSIS — R278 Other lack of coordination: Secondary | ICD-10-CM | POA: Diagnosis not present

## 2016-08-28 DIAGNOSIS — I69954 Hemiplegia and hemiparesis following unspecified cerebrovascular disease affecting left non-dominant side: Secondary | ICD-10-CM | POA: Diagnosis not present

## 2016-08-29 DIAGNOSIS — I69954 Hemiplegia and hemiparesis following unspecified cerebrovascular disease affecting left non-dominant side: Secondary | ICD-10-CM | POA: Diagnosis not present

## 2016-08-29 DIAGNOSIS — M6281 Muscle weakness (generalized): Secondary | ICD-10-CM | POA: Diagnosis not present

## 2016-08-29 DIAGNOSIS — M6240 Contracture of muscle, unspecified site: Secondary | ICD-10-CM | POA: Diagnosis not present

## 2016-08-29 DIAGNOSIS — R278 Other lack of coordination: Secondary | ICD-10-CM | POA: Diagnosis not present

## 2016-08-30 DIAGNOSIS — I69954 Hemiplegia and hemiparesis following unspecified cerebrovascular disease affecting left non-dominant side: Secondary | ICD-10-CM | POA: Diagnosis not present

## 2016-08-30 DIAGNOSIS — M6281 Muscle weakness (generalized): Secondary | ICD-10-CM | POA: Diagnosis not present

## 2016-08-30 DIAGNOSIS — M6240 Contracture of muscle, unspecified site: Secondary | ICD-10-CM | POA: Diagnosis not present

## 2016-08-30 DIAGNOSIS — R278 Other lack of coordination: Secondary | ICD-10-CM | POA: Diagnosis not present

## 2016-08-31 DIAGNOSIS — M6281 Muscle weakness (generalized): Secondary | ICD-10-CM | POA: Diagnosis not present

## 2016-08-31 DIAGNOSIS — M6240 Contracture of muscle, unspecified site: Secondary | ICD-10-CM | POA: Diagnosis not present

## 2016-08-31 DIAGNOSIS — R278 Other lack of coordination: Secondary | ICD-10-CM | POA: Diagnosis not present

## 2016-08-31 DIAGNOSIS — I69954 Hemiplegia and hemiparesis following unspecified cerebrovascular disease affecting left non-dominant side: Secondary | ICD-10-CM | POA: Diagnosis not present

## 2016-09-01 DIAGNOSIS — I69954 Hemiplegia and hemiparesis following unspecified cerebrovascular disease affecting left non-dominant side: Secondary | ICD-10-CM | POA: Diagnosis not present

## 2016-09-01 DIAGNOSIS — M6240 Contracture of muscle, unspecified site: Secondary | ICD-10-CM | POA: Diagnosis not present

## 2016-09-01 DIAGNOSIS — R278 Other lack of coordination: Secondary | ICD-10-CM | POA: Diagnosis not present

## 2016-09-01 DIAGNOSIS — M6281 Muscle weakness (generalized): Secondary | ICD-10-CM | POA: Diagnosis not present

## 2016-09-04 DIAGNOSIS — M6281 Muscle weakness (generalized): Secondary | ICD-10-CM | POA: Diagnosis not present

## 2016-09-04 DIAGNOSIS — M6240 Contracture of muscle, unspecified site: Secondary | ICD-10-CM | POA: Diagnosis not present

## 2016-09-04 DIAGNOSIS — R278 Other lack of coordination: Secondary | ICD-10-CM | POA: Diagnosis not present

## 2016-09-04 DIAGNOSIS — I69954 Hemiplegia and hemiparesis following unspecified cerebrovascular disease affecting left non-dominant side: Secondary | ICD-10-CM | POA: Diagnosis not present

## 2016-09-05 DIAGNOSIS — R278 Other lack of coordination: Secondary | ICD-10-CM | POA: Diagnosis not present

## 2016-09-05 DIAGNOSIS — M6281 Muscle weakness (generalized): Secondary | ICD-10-CM | POA: Diagnosis not present

## 2016-09-05 DIAGNOSIS — M6240 Contracture of muscle, unspecified site: Secondary | ICD-10-CM | POA: Diagnosis not present

## 2016-09-05 DIAGNOSIS — I69954 Hemiplegia and hemiparesis following unspecified cerebrovascular disease affecting left non-dominant side: Secondary | ICD-10-CM | POA: Diagnosis not present

## 2016-09-06 DIAGNOSIS — M6281 Muscle weakness (generalized): Secondary | ICD-10-CM | POA: Diagnosis not present

## 2016-09-06 DIAGNOSIS — M6240 Contracture of muscle, unspecified site: Secondary | ICD-10-CM | POA: Diagnosis not present

## 2016-09-06 DIAGNOSIS — I69954 Hemiplegia and hemiparesis following unspecified cerebrovascular disease affecting left non-dominant side: Secondary | ICD-10-CM | POA: Diagnosis not present

## 2016-09-06 DIAGNOSIS — R278 Other lack of coordination: Secondary | ICD-10-CM | POA: Diagnosis not present

## 2016-09-07 DIAGNOSIS — M6240 Contracture of muscle, unspecified site: Secondary | ICD-10-CM | POA: Diagnosis not present

## 2016-09-07 DIAGNOSIS — R278 Other lack of coordination: Secondary | ICD-10-CM | POA: Diagnosis not present

## 2016-09-07 DIAGNOSIS — M6281 Muscle weakness (generalized): Secondary | ICD-10-CM | POA: Diagnosis not present

## 2016-09-07 DIAGNOSIS — I69954 Hemiplegia and hemiparesis following unspecified cerebrovascular disease affecting left non-dominant side: Secondary | ICD-10-CM | POA: Diagnosis not present

## 2016-09-08 DIAGNOSIS — R278 Other lack of coordination: Secondary | ICD-10-CM | POA: Diagnosis not present

## 2016-09-08 DIAGNOSIS — M6281 Muscle weakness (generalized): Secondary | ICD-10-CM | POA: Diagnosis not present

## 2016-09-08 DIAGNOSIS — I69954 Hemiplegia and hemiparesis following unspecified cerebrovascular disease affecting left non-dominant side: Secondary | ICD-10-CM | POA: Diagnosis not present

## 2016-09-08 DIAGNOSIS — M6240 Contracture of muscle, unspecified site: Secondary | ICD-10-CM | POA: Diagnosis not present

## 2016-09-11 DIAGNOSIS — R278 Other lack of coordination: Secondary | ICD-10-CM | POA: Diagnosis not present

## 2016-09-11 DIAGNOSIS — M6240 Contracture of muscle, unspecified site: Secondary | ICD-10-CM | POA: Diagnosis not present

## 2016-09-11 DIAGNOSIS — I69954 Hemiplegia and hemiparesis following unspecified cerebrovascular disease affecting left non-dominant side: Secondary | ICD-10-CM | POA: Diagnosis not present

## 2016-09-11 DIAGNOSIS — M6281 Muscle weakness (generalized): Secondary | ICD-10-CM | POA: Diagnosis not present

## 2016-09-12 DIAGNOSIS — M6240 Contracture of muscle, unspecified site: Secondary | ICD-10-CM | POA: Diagnosis not present

## 2016-09-12 DIAGNOSIS — R278 Other lack of coordination: Secondary | ICD-10-CM | POA: Diagnosis not present

## 2016-09-12 DIAGNOSIS — I69954 Hemiplegia and hemiparesis following unspecified cerebrovascular disease affecting left non-dominant side: Secondary | ICD-10-CM | POA: Diagnosis not present

## 2016-09-12 DIAGNOSIS — M6281 Muscle weakness (generalized): Secondary | ICD-10-CM | POA: Diagnosis not present

## 2016-09-13 DIAGNOSIS — M6240 Contracture of muscle, unspecified site: Secondary | ICD-10-CM | POA: Diagnosis not present

## 2016-09-13 DIAGNOSIS — I69954 Hemiplegia and hemiparesis following unspecified cerebrovascular disease affecting left non-dominant side: Secondary | ICD-10-CM | POA: Diagnosis not present

## 2016-09-13 DIAGNOSIS — M6281 Muscle weakness (generalized): Secondary | ICD-10-CM | POA: Diagnosis not present

## 2016-09-13 DIAGNOSIS — R278 Other lack of coordination: Secondary | ICD-10-CM | POA: Diagnosis not present

## 2016-09-14 DIAGNOSIS — I69954 Hemiplegia and hemiparesis following unspecified cerebrovascular disease affecting left non-dominant side: Secondary | ICD-10-CM | POA: Diagnosis not present

## 2016-09-14 DIAGNOSIS — R278 Other lack of coordination: Secondary | ICD-10-CM | POA: Diagnosis not present

## 2016-09-14 DIAGNOSIS — M6240 Contracture of muscle, unspecified site: Secondary | ICD-10-CM | POA: Diagnosis not present

## 2016-09-14 DIAGNOSIS — M6281 Muscle weakness (generalized): Secondary | ICD-10-CM | POA: Diagnosis not present

## 2016-09-15 DIAGNOSIS — R278 Other lack of coordination: Secondary | ICD-10-CM | POA: Diagnosis not present

## 2016-09-15 DIAGNOSIS — M6281 Muscle weakness (generalized): Secondary | ICD-10-CM | POA: Diagnosis not present

## 2016-09-15 DIAGNOSIS — M6240 Contracture of muscle, unspecified site: Secondary | ICD-10-CM | POA: Diagnosis not present

## 2016-09-15 DIAGNOSIS — I69954 Hemiplegia and hemiparesis following unspecified cerebrovascular disease affecting left non-dominant side: Secondary | ICD-10-CM | POA: Diagnosis not present

## 2016-09-18 DIAGNOSIS — M6281 Muscle weakness (generalized): Secondary | ICD-10-CM | POA: Diagnosis not present

## 2016-09-18 DIAGNOSIS — I69954 Hemiplegia and hemiparesis following unspecified cerebrovascular disease affecting left non-dominant side: Secondary | ICD-10-CM | POA: Diagnosis not present

## 2016-09-18 DIAGNOSIS — R278 Other lack of coordination: Secondary | ICD-10-CM | POA: Diagnosis not present

## 2016-09-18 DIAGNOSIS — M6240 Contracture of muscle, unspecified site: Secondary | ICD-10-CM | POA: Diagnosis not present

## 2016-09-19 DIAGNOSIS — I69954 Hemiplegia and hemiparesis following unspecified cerebrovascular disease affecting left non-dominant side: Secondary | ICD-10-CM | POA: Diagnosis not present

## 2016-09-19 DIAGNOSIS — M6240 Contracture of muscle, unspecified site: Secondary | ICD-10-CM | POA: Diagnosis not present

## 2016-09-19 DIAGNOSIS — M6281 Muscle weakness (generalized): Secondary | ICD-10-CM | POA: Diagnosis not present

## 2016-09-19 DIAGNOSIS — R278 Other lack of coordination: Secondary | ICD-10-CM | POA: Diagnosis not present

## 2016-09-20 DIAGNOSIS — R278 Other lack of coordination: Secondary | ICD-10-CM | POA: Diagnosis not present

## 2016-09-20 DIAGNOSIS — M6240 Contracture of muscle, unspecified site: Secondary | ICD-10-CM | POA: Diagnosis not present

## 2016-09-20 DIAGNOSIS — I69954 Hemiplegia and hemiparesis following unspecified cerebrovascular disease affecting left non-dominant side: Secondary | ICD-10-CM | POA: Diagnosis not present

## 2016-09-20 DIAGNOSIS — M6281 Muscle weakness (generalized): Secondary | ICD-10-CM | POA: Diagnosis not present

## 2016-09-21 DIAGNOSIS — M6240 Contracture of muscle, unspecified site: Secondary | ICD-10-CM | POA: Diagnosis not present

## 2016-09-21 DIAGNOSIS — R278 Other lack of coordination: Secondary | ICD-10-CM | POA: Diagnosis not present

## 2016-09-21 DIAGNOSIS — I69954 Hemiplegia and hemiparesis following unspecified cerebrovascular disease affecting left non-dominant side: Secondary | ICD-10-CM | POA: Diagnosis not present

## 2016-09-21 DIAGNOSIS — M6281 Muscle weakness (generalized): Secondary | ICD-10-CM | POA: Diagnosis not present

## 2016-09-22 DIAGNOSIS — M6240 Contracture of muscle, unspecified site: Secondary | ICD-10-CM | POA: Diagnosis not present

## 2016-09-22 DIAGNOSIS — I69954 Hemiplegia and hemiparesis following unspecified cerebrovascular disease affecting left non-dominant side: Secondary | ICD-10-CM | POA: Diagnosis not present

## 2016-09-22 DIAGNOSIS — M6281 Muscle weakness (generalized): Secondary | ICD-10-CM | POA: Diagnosis not present

## 2016-09-22 DIAGNOSIS — R278 Other lack of coordination: Secondary | ICD-10-CM | POA: Diagnosis not present

## 2016-09-25 DIAGNOSIS — M6281 Muscle weakness (generalized): Secondary | ICD-10-CM | POA: Diagnosis not present

## 2016-09-25 DIAGNOSIS — I69954 Hemiplegia and hemiparesis following unspecified cerebrovascular disease affecting left non-dominant side: Secondary | ICD-10-CM | POA: Diagnosis not present

## 2016-09-25 DIAGNOSIS — R278 Other lack of coordination: Secondary | ICD-10-CM | POA: Diagnosis not present

## 2016-09-25 DIAGNOSIS — M6240 Contracture of muscle, unspecified site: Secondary | ICD-10-CM | POA: Diagnosis not present

## 2016-09-26 DIAGNOSIS — I69954 Hemiplegia and hemiparesis following unspecified cerebrovascular disease affecting left non-dominant side: Secondary | ICD-10-CM | POA: Diagnosis not present

## 2016-09-26 DIAGNOSIS — R278 Other lack of coordination: Secondary | ICD-10-CM | POA: Diagnosis not present

## 2016-09-26 DIAGNOSIS — M6281 Muscle weakness (generalized): Secondary | ICD-10-CM | POA: Diagnosis not present

## 2016-09-26 DIAGNOSIS — M6240 Contracture of muscle, unspecified site: Secondary | ICD-10-CM | POA: Diagnosis not present

## 2016-09-27 DIAGNOSIS — M6281 Muscle weakness (generalized): Secondary | ICD-10-CM | POA: Diagnosis not present

## 2016-09-27 DIAGNOSIS — M6240 Contracture of muscle, unspecified site: Secondary | ICD-10-CM | POA: Diagnosis not present

## 2016-09-27 DIAGNOSIS — I69954 Hemiplegia and hemiparesis following unspecified cerebrovascular disease affecting left non-dominant side: Secondary | ICD-10-CM | POA: Diagnosis not present

## 2016-09-27 DIAGNOSIS — R278 Other lack of coordination: Secondary | ICD-10-CM | POA: Diagnosis not present

## 2016-09-28 DIAGNOSIS — I69954 Hemiplegia and hemiparesis following unspecified cerebrovascular disease affecting left non-dominant side: Secondary | ICD-10-CM | POA: Diagnosis not present

## 2016-09-28 DIAGNOSIS — M6281 Muscle weakness (generalized): Secondary | ICD-10-CM | POA: Diagnosis not present

## 2016-09-28 DIAGNOSIS — R278 Other lack of coordination: Secondary | ICD-10-CM | POA: Diagnosis not present

## 2016-09-28 DIAGNOSIS — M6240 Contracture of muscle, unspecified site: Secondary | ICD-10-CM | POA: Diagnosis not present

## 2016-09-29 DIAGNOSIS — M6281 Muscle weakness (generalized): Secondary | ICD-10-CM | POA: Diagnosis not present

## 2016-09-29 DIAGNOSIS — M6240 Contracture of muscle, unspecified site: Secondary | ICD-10-CM | POA: Diagnosis not present

## 2016-09-29 DIAGNOSIS — I69954 Hemiplegia and hemiparesis following unspecified cerebrovascular disease affecting left non-dominant side: Secondary | ICD-10-CM | POA: Diagnosis not present

## 2016-09-29 DIAGNOSIS — R278 Other lack of coordination: Secondary | ICD-10-CM | POA: Diagnosis not present

## 2016-10-02 DIAGNOSIS — R278 Other lack of coordination: Secondary | ICD-10-CM | POA: Diagnosis not present

## 2016-10-02 DIAGNOSIS — M6240 Contracture of muscle, unspecified site: Secondary | ICD-10-CM | POA: Diagnosis not present

## 2016-10-02 DIAGNOSIS — I69954 Hemiplegia and hemiparesis following unspecified cerebrovascular disease affecting left non-dominant side: Secondary | ICD-10-CM | POA: Diagnosis not present

## 2016-10-02 DIAGNOSIS — M6281 Muscle weakness (generalized): Secondary | ICD-10-CM | POA: Diagnosis not present

## 2016-10-03 DIAGNOSIS — R278 Other lack of coordination: Secondary | ICD-10-CM | POA: Diagnosis not present

## 2016-10-03 DIAGNOSIS — I482 Chronic atrial fibrillation: Secondary | ICD-10-CM | POA: Diagnosis not present

## 2016-10-03 DIAGNOSIS — I639 Cerebral infarction, unspecified: Secondary | ICD-10-CM | POA: Diagnosis not present

## 2016-10-03 DIAGNOSIS — M6240 Contracture of muscle, unspecified site: Secondary | ICD-10-CM | POA: Diagnosis not present

## 2016-10-03 DIAGNOSIS — I69954 Hemiplegia and hemiparesis following unspecified cerebrovascular disease affecting left non-dominant side: Secondary | ICD-10-CM | POA: Diagnosis not present

## 2016-10-03 DIAGNOSIS — M6281 Muscle weakness (generalized): Secondary | ICD-10-CM | POA: Diagnosis not present

## 2016-10-04 DIAGNOSIS — R278 Other lack of coordination: Secondary | ICD-10-CM | POA: Diagnosis not present

## 2016-10-04 DIAGNOSIS — M6240 Contracture of muscle, unspecified site: Secondary | ICD-10-CM | POA: Diagnosis not present

## 2016-10-04 DIAGNOSIS — M6281 Muscle weakness (generalized): Secondary | ICD-10-CM | POA: Diagnosis not present

## 2016-10-04 DIAGNOSIS — I69954 Hemiplegia and hemiparesis following unspecified cerebrovascular disease affecting left non-dominant side: Secondary | ICD-10-CM | POA: Diagnosis not present

## 2016-10-05 DIAGNOSIS — R278 Other lack of coordination: Secondary | ICD-10-CM | POA: Diagnosis not present

## 2016-10-05 DIAGNOSIS — M6240 Contracture of muscle, unspecified site: Secondary | ICD-10-CM | POA: Diagnosis not present

## 2016-10-05 DIAGNOSIS — M6281 Muscle weakness (generalized): Secondary | ICD-10-CM | POA: Diagnosis not present

## 2016-10-05 DIAGNOSIS — I69954 Hemiplegia and hemiparesis following unspecified cerebrovascular disease affecting left non-dominant side: Secondary | ICD-10-CM | POA: Diagnosis not present

## 2016-10-06 DIAGNOSIS — M6281 Muscle weakness (generalized): Secondary | ICD-10-CM | POA: Diagnosis not present

## 2016-10-06 DIAGNOSIS — M6240 Contracture of muscle, unspecified site: Secondary | ICD-10-CM | POA: Diagnosis not present

## 2016-10-06 DIAGNOSIS — R278 Other lack of coordination: Secondary | ICD-10-CM | POA: Diagnosis not present

## 2016-10-06 DIAGNOSIS — I69954 Hemiplegia and hemiparesis following unspecified cerebrovascular disease affecting left non-dominant side: Secondary | ICD-10-CM | POA: Diagnosis not present

## 2016-10-09 DIAGNOSIS — I69954 Hemiplegia and hemiparesis following unspecified cerebrovascular disease affecting left non-dominant side: Secondary | ICD-10-CM | POA: Diagnosis not present

## 2016-10-09 DIAGNOSIS — M6281 Muscle weakness (generalized): Secondary | ICD-10-CM | POA: Diagnosis not present

## 2016-10-09 DIAGNOSIS — M6240 Contracture of muscle, unspecified site: Secondary | ICD-10-CM | POA: Diagnosis not present

## 2016-10-09 DIAGNOSIS — R278 Other lack of coordination: Secondary | ICD-10-CM | POA: Diagnosis not present

## 2016-10-10 DIAGNOSIS — I69954 Hemiplegia and hemiparesis following unspecified cerebrovascular disease affecting left non-dominant side: Secondary | ICD-10-CM | POA: Diagnosis not present

## 2016-10-10 DIAGNOSIS — M6281 Muscle weakness (generalized): Secondary | ICD-10-CM | POA: Diagnosis not present

## 2016-10-10 DIAGNOSIS — M6240 Contracture of muscle, unspecified site: Secondary | ICD-10-CM | POA: Diagnosis not present

## 2016-10-10 DIAGNOSIS — R278 Other lack of coordination: Secondary | ICD-10-CM | POA: Diagnosis not present

## 2016-10-12 DIAGNOSIS — I69954 Hemiplegia and hemiparesis following unspecified cerebrovascular disease affecting left non-dominant side: Secondary | ICD-10-CM | POA: Diagnosis not present

## 2016-10-12 DIAGNOSIS — M6281 Muscle weakness (generalized): Secondary | ICD-10-CM | POA: Diagnosis not present

## 2016-10-12 DIAGNOSIS — R278 Other lack of coordination: Secondary | ICD-10-CM | POA: Diagnosis not present

## 2016-10-12 DIAGNOSIS — M6240 Contracture of muscle, unspecified site: Secondary | ICD-10-CM | POA: Diagnosis not present

## 2016-10-13 DIAGNOSIS — I69954 Hemiplegia and hemiparesis following unspecified cerebrovascular disease affecting left non-dominant side: Secondary | ICD-10-CM | POA: Diagnosis not present

## 2016-10-13 DIAGNOSIS — M6281 Muscle weakness (generalized): Secondary | ICD-10-CM | POA: Diagnosis not present

## 2016-10-13 DIAGNOSIS — M6240 Contracture of muscle, unspecified site: Secondary | ICD-10-CM | POA: Diagnosis not present

## 2016-10-13 DIAGNOSIS — R278 Other lack of coordination: Secondary | ICD-10-CM | POA: Diagnosis not present

## 2016-10-16 DIAGNOSIS — M6281 Muscle weakness (generalized): Secondary | ICD-10-CM | POA: Diagnosis not present

## 2016-10-16 DIAGNOSIS — M6240 Contracture of muscle, unspecified site: Secondary | ICD-10-CM | POA: Diagnosis not present

## 2016-10-16 DIAGNOSIS — R278 Other lack of coordination: Secondary | ICD-10-CM | POA: Diagnosis not present

## 2016-10-16 DIAGNOSIS — I69954 Hemiplegia and hemiparesis following unspecified cerebrovascular disease affecting left non-dominant side: Secondary | ICD-10-CM | POA: Diagnosis not present

## 2016-10-17 DIAGNOSIS — R278 Other lack of coordination: Secondary | ICD-10-CM | POA: Diagnosis not present

## 2016-10-17 DIAGNOSIS — I69954 Hemiplegia and hemiparesis following unspecified cerebrovascular disease affecting left non-dominant side: Secondary | ICD-10-CM | POA: Diagnosis not present

## 2016-10-17 DIAGNOSIS — M6240 Contracture of muscle, unspecified site: Secondary | ICD-10-CM | POA: Diagnosis not present

## 2016-10-17 DIAGNOSIS — M6281 Muscle weakness (generalized): Secondary | ICD-10-CM | POA: Diagnosis not present

## 2016-10-18 DIAGNOSIS — M6281 Muscle weakness (generalized): Secondary | ICD-10-CM | POA: Diagnosis not present

## 2016-10-18 DIAGNOSIS — I69954 Hemiplegia and hemiparesis following unspecified cerebrovascular disease affecting left non-dominant side: Secondary | ICD-10-CM | POA: Diagnosis not present

## 2016-10-18 DIAGNOSIS — R278 Other lack of coordination: Secondary | ICD-10-CM | POA: Diagnosis not present

## 2016-10-18 DIAGNOSIS — M6240 Contracture of muscle, unspecified site: Secondary | ICD-10-CM | POA: Diagnosis not present

## 2016-10-19 DIAGNOSIS — R278 Other lack of coordination: Secondary | ICD-10-CM | POA: Diagnosis not present

## 2016-10-19 DIAGNOSIS — M6240 Contracture of muscle, unspecified site: Secondary | ICD-10-CM | POA: Diagnosis not present

## 2016-10-19 DIAGNOSIS — I69954 Hemiplegia and hemiparesis following unspecified cerebrovascular disease affecting left non-dominant side: Secondary | ICD-10-CM | POA: Diagnosis not present

## 2016-10-19 DIAGNOSIS — M6281 Muscle weakness (generalized): Secondary | ICD-10-CM | POA: Diagnosis not present

## 2016-10-20 DIAGNOSIS — M6240 Contracture of muscle, unspecified site: Secondary | ICD-10-CM | POA: Diagnosis not present

## 2016-10-20 DIAGNOSIS — R278 Other lack of coordination: Secondary | ICD-10-CM | POA: Diagnosis not present

## 2016-10-20 DIAGNOSIS — M6281 Muscle weakness (generalized): Secondary | ICD-10-CM | POA: Diagnosis not present

## 2016-10-20 DIAGNOSIS — I69954 Hemiplegia and hemiparesis following unspecified cerebrovascular disease affecting left non-dominant side: Secondary | ICD-10-CM | POA: Diagnosis not present

## 2016-10-23 DIAGNOSIS — I69954 Hemiplegia and hemiparesis following unspecified cerebrovascular disease affecting left non-dominant side: Secondary | ICD-10-CM | POA: Diagnosis not present

## 2016-10-23 DIAGNOSIS — M6240 Contracture of muscle, unspecified site: Secondary | ICD-10-CM | POA: Diagnosis not present

## 2016-10-23 DIAGNOSIS — R278 Other lack of coordination: Secondary | ICD-10-CM | POA: Diagnosis not present

## 2016-10-23 DIAGNOSIS — M6281 Muscle weakness (generalized): Secondary | ICD-10-CM | POA: Diagnosis not present

## 2016-10-24 DIAGNOSIS — M6240 Contracture of muscle, unspecified site: Secondary | ICD-10-CM | POA: Diagnosis not present

## 2016-10-24 DIAGNOSIS — M6281 Muscle weakness (generalized): Secondary | ICD-10-CM | POA: Diagnosis not present

## 2016-10-24 DIAGNOSIS — R278 Other lack of coordination: Secondary | ICD-10-CM | POA: Diagnosis not present

## 2016-10-24 DIAGNOSIS — I69954 Hemiplegia and hemiparesis following unspecified cerebrovascular disease affecting left non-dominant side: Secondary | ICD-10-CM | POA: Diagnosis not present

## 2016-10-28 DIAGNOSIS — R0902 Hypoxemia: Secondary | ICD-10-CM | POA: Diagnosis not present

## 2016-10-28 DIAGNOSIS — E860 Dehydration: Secondary | ICD-10-CM | POA: Diagnosis not present

## 2016-10-28 DIAGNOSIS — N3000 Acute cystitis without hematuria: Secondary | ICD-10-CM | POA: Diagnosis not present

## 2016-10-28 DIAGNOSIS — N390 Urinary tract infection, site not specified: Secondary | ICD-10-CM | POA: Diagnosis not present

## 2016-10-28 DIAGNOSIS — B964 Proteus (mirabilis) (morganii) as the cause of diseases classified elsewhere: Secondary | ICD-10-CM | POA: Diagnosis not present

## 2016-10-28 DIAGNOSIS — I6789 Other cerebrovascular disease: Secondary | ICD-10-CM | POA: Diagnosis not present

## 2016-10-28 DIAGNOSIS — R400 Somnolence: Secondary | ICD-10-CM | POA: Diagnosis not present

## 2016-10-28 DIAGNOSIS — R4182 Altered mental status, unspecified: Secondary | ICD-10-CM | POA: Diagnosis not present

## 2016-10-28 DIAGNOSIS — I69354 Hemiplegia and hemiparesis following cerebral infarction affecting left non-dominant side: Secondary | ICD-10-CM | POA: Diagnosis not present

## 2016-10-28 DIAGNOSIS — F4489 Other dissociative and conversion disorders: Secondary | ICD-10-CM | POA: Diagnosis not present

## 2016-10-28 DIAGNOSIS — R930 Abnormal findings on diagnostic imaging of skull and head, not elsewhere classified: Secondary | ICD-10-CM | POA: Diagnosis not present

## 2016-10-28 DIAGNOSIS — N170 Acute kidney failure with tubular necrosis: Secondary | ICD-10-CM | POA: Diagnosis not present

## 2016-10-29 DIAGNOSIS — I69354 Hemiplegia and hemiparesis following cerebral infarction affecting left non-dominant side: Secondary | ICD-10-CM | POA: Diagnosis not present

## 2016-10-29 DIAGNOSIS — G40409 Other generalized epilepsy and epileptic syndromes, not intractable, without status epilepticus: Secondary | ICD-10-CM | POA: Diagnosis not present

## 2016-10-29 DIAGNOSIS — G2581 Restless legs syndrome: Secondary | ICD-10-CM | POA: Diagnosis present

## 2016-10-29 DIAGNOSIS — E785 Hyperlipidemia, unspecified: Secondary | ICD-10-CM | POA: Diagnosis present

## 2016-10-29 DIAGNOSIS — Z7902 Long term (current) use of antithrombotics/antiplatelets: Secondary | ICD-10-CM | POA: Diagnosis not present

## 2016-10-29 DIAGNOSIS — M1990 Unspecified osteoarthritis, unspecified site: Secondary | ICD-10-CM | POA: Diagnosis present

## 2016-10-29 DIAGNOSIS — Z886 Allergy status to analgesic agent status: Secondary | ICD-10-CM | POA: Diagnosis not present

## 2016-10-29 DIAGNOSIS — R4182 Altered mental status, unspecified: Secondary | ICD-10-CM | POA: Diagnosis not present

## 2016-10-29 DIAGNOSIS — R279 Unspecified lack of coordination: Secondary | ICD-10-CM | POA: Diagnosis not present

## 2016-10-29 DIAGNOSIS — N390 Urinary tract infection, site not specified: Secondary | ICD-10-CM | POA: Diagnosis not present

## 2016-10-29 DIAGNOSIS — N170 Acute kidney failure with tubular necrosis: Secondary | ICD-10-CM | POA: Diagnosis not present

## 2016-10-29 DIAGNOSIS — Z23 Encounter for immunization: Secondary | ICD-10-CM | POA: Diagnosis not present

## 2016-10-29 DIAGNOSIS — R2689 Other abnormalities of gait and mobility: Secondary | ICD-10-CM | POA: Diagnosis not present

## 2016-10-29 DIAGNOSIS — Z881 Allergy status to other antibiotic agents status: Secondary | ICD-10-CM | POA: Diagnosis not present

## 2016-10-29 DIAGNOSIS — Z85850 Personal history of malignant neoplasm of thyroid: Secondary | ICD-10-CM | POA: Diagnosis not present

## 2016-10-29 DIAGNOSIS — R278 Other lack of coordination: Secondary | ICD-10-CM | POA: Diagnosis not present

## 2016-10-29 DIAGNOSIS — E860 Dehydration: Secondary | ICD-10-CM | POA: Diagnosis present

## 2016-10-29 DIAGNOSIS — Z7401 Bed confinement status: Secondary | ICD-10-CM | POA: Diagnosis not present

## 2016-10-29 DIAGNOSIS — I4891 Unspecified atrial fibrillation: Secondary | ICD-10-CM | POA: Diagnosis not present

## 2016-10-29 DIAGNOSIS — E876 Hypokalemia: Secondary | ICD-10-CM | POA: Diagnosis not present

## 2016-10-29 DIAGNOSIS — N19 Unspecified kidney failure: Secondary | ICD-10-CM | POA: Diagnosis not present

## 2016-10-29 DIAGNOSIS — K5900 Constipation, unspecified: Secondary | ICD-10-CM | POA: Diagnosis not present

## 2016-10-29 DIAGNOSIS — K219 Gastro-esophageal reflux disease without esophagitis: Secondary | ICD-10-CM | POA: Diagnosis present

## 2016-10-29 DIAGNOSIS — N179 Acute kidney failure, unspecified: Secondary | ICD-10-CM | POA: Diagnosis not present

## 2016-10-29 DIAGNOSIS — Z888 Allergy status to other drugs, medicaments and biological substances status: Secondary | ICD-10-CM | POA: Diagnosis not present

## 2016-10-29 DIAGNOSIS — Z8673 Personal history of transient ischemic attack (TIA), and cerebral infarction without residual deficits: Secondary | ICD-10-CM | POA: Diagnosis not present

## 2016-10-29 DIAGNOSIS — K5909 Other constipation: Secondary | ICD-10-CM | POA: Diagnosis present

## 2016-10-29 DIAGNOSIS — R930 Abnormal findings on diagnostic imaging of skull and head, not elsewhere classified: Secondary | ICD-10-CM | POA: Diagnosis not present

## 2016-10-29 DIAGNOSIS — F319 Bipolar disorder, unspecified: Secondary | ICD-10-CM | POA: Diagnosis not present

## 2016-10-29 DIAGNOSIS — M6281 Muscle weakness (generalized): Secondary | ICD-10-CM | POA: Diagnosis not present

## 2016-10-29 DIAGNOSIS — E039 Hypothyroidism, unspecified: Secondary | ICD-10-CM | POA: Diagnosis not present

## 2016-10-29 DIAGNOSIS — G4089 Other seizures: Secondary | ICD-10-CM | POA: Diagnosis not present

## 2016-10-29 DIAGNOSIS — Z79899 Other long term (current) drug therapy: Secondary | ICD-10-CM | POA: Diagnosis not present

## 2016-10-29 DIAGNOSIS — B964 Proteus (mirabilis) (morganii) as the cause of diseases classified elsewhere: Secondary | ICD-10-CM | POA: Diagnosis present

## 2016-10-29 DIAGNOSIS — Z853 Personal history of malignant neoplasm of breast: Secondary | ICD-10-CM | POA: Diagnosis not present

## 2016-10-29 DIAGNOSIS — I482 Chronic atrial fibrillation: Secondary | ICD-10-CM | POA: Diagnosis not present

## 2016-10-29 DIAGNOSIS — T452X5A Adverse effect of vitamins, initial encounter: Secondary | ICD-10-CM | POA: Diagnosis present

## 2016-10-29 DIAGNOSIS — I272 Pulmonary hypertension, unspecified: Secondary | ICD-10-CM | POA: Diagnosis present

## 2016-11-01 DIAGNOSIS — F05 Delirium due to known physiological condition: Secondary | ICD-10-CM | POA: Diagnosis not present

## 2016-11-01 DIAGNOSIS — F319 Bipolar disorder, unspecified: Secondary | ICD-10-CM | POA: Diagnosis not present

## 2016-11-01 DIAGNOSIS — R51 Headache: Secondary | ICD-10-CM | POA: Diagnosis not present

## 2016-11-01 DIAGNOSIS — E213 Hyperparathyroidism, unspecified: Secondary | ICD-10-CM | POA: Diagnosis not present

## 2016-11-01 DIAGNOSIS — N390 Urinary tract infection, site not specified: Secondary | ICD-10-CM | POA: Diagnosis not present

## 2016-11-01 DIAGNOSIS — G4089 Other seizures: Secondary | ICD-10-CM | POA: Diagnosis not present

## 2016-11-01 DIAGNOSIS — Z79899 Other long term (current) drug therapy: Secondary | ICD-10-CM | POA: Diagnosis not present

## 2016-11-01 DIAGNOSIS — I4891 Unspecified atrial fibrillation: Secondary | ICD-10-CM | POA: Diagnosis not present

## 2016-11-01 DIAGNOSIS — Z8673 Personal history of transient ischemic attack (TIA), and cerebral infarction without residual deficits: Secondary | ICD-10-CM | POA: Diagnosis not present

## 2016-11-01 DIAGNOSIS — F0634 Mood disorder due to known physiological condition with mixed features: Secondary | ICD-10-CM | POA: Diagnosis present

## 2016-11-01 DIAGNOSIS — Z7401 Bed confinement status: Secondary | ICD-10-CM | POA: Diagnosis not present

## 2016-11-01 DIAGNOSIS — Z23 Encounter for immunization: Secondary | ICD-10-CM | POA: Diagnosis not present

## 2016-11-01 DIAGNOSIS — M6281 Muscle weakness (generalized): Secondary | ICD-10-CM | POA: Diagnosis not present

## 2016-11-01 DIAGNOSIS — Z885 Allergy status to narcotic agent status: Secondary | ICD-10-CM | POA: Diagnosis not present

## 2016-11-01 DIAGNOSIS — R279 Unspecified lack of coordination: Secondary | ICD-10-CM | POA: Diagnosis not present

## 2016-11-01 DIAGNOSIS — Z882 Allergy status to sulfonamides status: Secondary | ICD-10-CM | POA: Diagnosis not present

## 2016-11-01 DIAGNOSIS — F3189 Other bipolar disorder: Secondary | ICD-10-CM | POA: Diagnosis present

## 2016-11-01 DIAGNOSIS — E876 Hypokalemia: Secondary | ICD-10-CM | POA: Diagnosis present

## 2016-11-01 DIAGNOSIS — M1990 Unspecified osteoarthritis, unspecified site: Secondary | ICD-10-CM | POA: Diagnosis not present

## 2016-11-01 DIAGNOSIS — B964 Proteus (mirabilis) (morganii) as the cause of diseases classified elsewhere: Secondary | ICD-10-CM | POA: Diagnosis not present

## 2016-11-01 DIAGNOSIS — Z881 Allergy status to other antibiotic agents status: Secondary | ICD-10-CM | POA: Diagnosis not present

## 2016-11-01 DIAGNOSIS — Z743 Need for continuous supervision: Secondary | ICD-10-CM | POA: Diagnosis not present

## 2016-11-01 DIAGNOSIS — S0990XA Unspecified injury of head, initial encounter: Secondary | ICD-10-CM | POA: Diagnosis not present

## 2016-11-01 DIAGNOSIS — D638 Anemia in other chronic diseases classified elsewhere: Secondary | ICD-10-CM | POA: Diagnosis present

## 2016-11-01 DIAGNOSIS — I482 Chronic atrial fibrillation: Secondary | ICD-10-CM | POA: Diagnosis not present

## 2016-11-01 DIAGNOSIS — R278 Other lack of coordination: Secondary | ICD-10-CM | POA: Diagnosis not present

## 2016-11-01 DIAGNOSIS — R748 Abnormal levels of other serum enzymes: Secondary | ICD-10-CM | POA: Diagnosis present

## 2016-11-01 DIAGNOSIS — Z66 Do not resuscitate: Secondary | ICD-10-CM | POA: Diagnosis present

## 2016-11-01 DIAGNOSIS — R079 Chest pain, unspecified: Secondary | ICD-10-CM | POA: Diagnosis not present

## 2016-11-01 DIAGNOSIS — N170 Acute kidney failure with tubular necrosis: Secondary | ICD-10-CM | POA: Diagnosis not present

## 2016-11-01 DIAGNOSIS — N179 Acute kidney failure, unspecified: Secondary | ICD-10-CM | POA: Diagnosis not present

## 2016-11-01 DIAGNOSIS — N183 Chronic kidney disease, stage 3 (moderate): Secondary | ICD-10-CM | POA: Diagnosis present

## 2016-11-01 DIAGNOSIS — I69354 Hemiplegia and hemiparesis following cerebral infarction affecting left non-dominant side: Secondary | ICD-10-CM | POA: Diagnosis not present

## 2016-11-01 DIAGNOSIS — F4489 Other dissociative and conversion disorders: Secondary | ICD-10-CM | POA: Diagnosis not present

## 2016-11-01 DIAGNOSIS — R4182 Altered mental status, unspecified: Secondary | ICD-10-CM | POA: Diagnosis not present

## 2016-11-01 DIAGNOSIS — I517 Cardiomegaly: Secondary | ICD-10-CM | POA: Diagnosis not present

## 2016-11-01 DIAGNOSIS — K760 Fatty (change of) liver, not elsewhere classified: Secondary | ICD-10-CM | POA: Diagnosis present

## 2016-11-01 DIAGNOSIS — E039 Hypothyroidism, unspecified: Secondary | ICD-10-CM | POA: Diagnosis present

## 2016-11-01 DIAGNOSIS — I4892 Unspecified atrial flutter: Secondary | ICD-10-CM | POA: Diagnosis not present

## 2016-11-01 DIAGNOSIS — K5900 Constipation, unspecified: Secondary | ICD-10-CM | POA: Diagnosis not present

## 2016-11-01 DIAGNOSIS — R112 Nausea with vomiting, unspecified: Secondary | ICD-10-CM | POA: Diagnosis not present

## 2016-11-01 DIAGNOSIS — W19XXXA Unspecified fall, initial encounter: Secondary | ICD-10-CM | POA: Diagnosis not present

## 2016-11-01 DIAGNOSIS — E211 Secondary hyperparathyroidism, not elsewhere classified: Secondary | ICD-10-CM | POA: Diagnosis present

## 2016-11-01 DIAGNOSIS — G40409 Other generalized epilepsy and epileptic syndromes, not intractable, without status epilepticus: Secondary | ICD-10-CM | POA: Diagnosis not present

## 2016-11-01 DIAGNOSIS — G2581 Restless legs syndrome: Secondary | ICD-10-CM | POA: Diagnosis present

## 2016-11-01 DIAGNOSIS — R2689 Other abnormalities of gait and mobility: Secondary | ICD-10-CM | POA: Diagnosis not present

## 2016-11-01 DIAGNOSIS — E785 Hyperlipidemia, unspecified: Secondary | ICD-10-CM | POA: Diagnosis not present

## 2016-11-01 DIAGNOSIS — G4489 Other headache syndrome: Secondary | ICD-10-CM | POA: Diagnosis not present

## 2016-11-01 DIAGNOSIS — K219 Gastro-esophageal reflux disease without esophagitis: Secondary | ICD-10-CM | POA: Diagnosis present

## 2016-11-02 DIAGNOSIS — I482 Chronic atrial fibrillation: Secondary | ICD-10-CM | POA: Diagnosis not present

## 2016-11-02 DIAGNOSIS — N170 Acute kidney failure with tubular necrosis: Secondary | ICD-10-CM | POA: Diagnosis not present

## 2016-11-14 DIAGNOSIS — F3189 Other bipolar disorder: Secondary | ICD-10-CM | POA: Diagnosis not present

## 2016-11-14 DIAGNOSIS — R079 Chest pain, unspecified: Secondary | ICD-10-CM | POA: Diagnosis not present

## 2016-11-14 DIAGNOSIS — S0990XA Unspecified injury of head, initial encounter: Secondary | ICD-10-CM | POA: Diagnosis not present

## 2016-11-14 DIAGNOSIS — K760 Fatty (change of) liver, not elsewhere classified: Secondary | ICD-10-CM | POA: Diagnosis not present

## 2016-11-14 DIAGNOSIS — I69354 Hemiplegia and hemiparesis following cerebral infarction affecting left non-dominant side: Secondary | ICD-10-CM | POA: Diagnosis not present

## 2016-11-14 DIAGNOSIS — R51 Headache: Secondary | ICD-10-CM | POA: Diagnosis not present

## 2016-11-14 DIAGNOSIS — I4892 Unspecified atrial flutter: Secondary | ICD-10-CM | POA: Diagnosis not present

## 2016-11-14 DIAGNOSIS — E211 Secondary hyperparathyroidism, not elsewhere classified: Secondary | ICD-10-CM | POA: Diagnosis not present

## 2016-11-14 DIAGNOSIS — E213 Hyperparathyroidism, unspecified: Secondary | ICD-10-CM | POA: Diagnosis not present

## 2016-11-14 DIAGNOSIS — I517 Cardiomegaly: Secondary | ICD-10-CM | POA: Diagnosis not present

## 2016-11-14 DIAGNOSIS — E876 Hypokalemia: Secondary | ICD-10-CM | POA: Diagnosis not present

## 2016-11-14 DIAGNOSIS — W19XXXA Unspecified fall, initial encounter: Secondary | ICD-10-CM | POA: Diagnosis not present

## 2016-11-16 DIAGNOSIS — I69354 Hemiplegia and hemiparesis following cerebral infarction affecting left non-dominant side: Secondary | ICD-10-CM | POA: Diagnosis not present

## 2016-11-16 DIAGNOSIS — D638 Anemia in other chronic diseases classified elsewhere: Secondary | ICD-10-CM | POA: Diagnosis present

## 2016-11-16 DIAGNOSIS — R079 Chest pain, unspecified: Secondary | ICD-10-CM | POA: Diagnosis present

## 2016-11-16 DIAGNOSIS — K219 Gastro-esophageal reflux disease without esophagitis: Secondary | ICD-10-CM | POA: Diagnosis present

## 2016-11-16 DIAGNOSIS — M6281 Muscle weakness (generalized): Secondary | ICD-10-CM | POA: Diagnosis not present

## 2016-11-16 DIAGNOSIS — Z66 Do not resuscitate: Secondary | ICD-10-CM | POA: Diagnosis present

## 2016-11-16 DIAGNOSIS — G2581 Restless legs syndrome: Secondary | ICD-10-CM | POA: Diagnosis present

## 2016-11-16 DIAGNOSIS — Z79899 Other long term (current) drug therapy: Secondary | ICD-10-CM | POA: Diagnosis not present

## 2016-11-16 DIAGNOSIS — E213 Hyperparathyroidism, unspecified: Secondary | ICD-10-CM | POA: Diagnosis not present

## 2016-11-16 DIAGNOSIS — R748 Abnormal levels of other serum enzymes: Secondary | ICD-10-CM | POA: Diagnosis present

## 2016-11-16 DIAGNOSIS — Z743 Need for continuous supervision: Secondary | ICD-10-CM | POA: Diagnosis not present

## 2016-11-16 DIAGNOSIS — G40409 Other generalized epilepsy and epileptic syndromes, not intractable, without status epilepticus: Secondary | ICD-10-CM | POA: Diagnosis present

## 2016-11-16 DIAGNOSIS — R2689 Other abnormalities of gait and mobility: Secondary | ICD-10-CM | POA: Diagnosis not present

## 2016-11-16 DIAGNOSIS — F0634 Mood disorder due to known physiological condition with mixed features: Secondary | ICD-10-CM | POA: Diagnosis present

## 2016-11-16 DIAGNOSIS — E039 Hypothyroidism, unspecified: Secondary | ICD-10-CM | POA: Diagnosis present

## 2016-11-16 DIAGNOSIS — R279 Unspecified lack of coordination: Secondary | ICD-10-CM | POA: Diagnosis not present

## 2016-11-16 DIAGNOSIS — K760 Fatty (change of) liver, not elsewhere classified: Secondary | ICD-10-CM | POA: Diagnosis present

## 2016-11-16 DIAGNOSIS — Z882 Allergy status to sulfonamides status: Secondary | ICD-10-CM | POA: Diagnosis not present

## 2016-11-16 DIAGNOSIS — Z885 Allergy status to narcotic agent status: Secondary | ICD-10-CM | POA: Diagnosis not present

## 2016-11-16 DIAGNOSIS — Z881 Allergy status to other antibiotic agents status: Secondary | ICD-10-CM | POA: Diagnosis not present

## 2016-11-16 DIAGNOSIS — I482 Chronic atrial fibrillation: Secondary | ICD-10-CM | POA: Diagnosis present

## 2016-11-16 DIAGNOSIS — F3189 Other bipolar disorder: Secondary | ICD-10-CM | POA: Diagnosis present

## 2016-11-16 DIAGNOSIS — R296 Repeated falls: Secondary | ICD-10-CM | POA: Diagnosis not present

## 2016-11-16 DIAGNOSIS — N183 Chronic kidney disease, stage 3 (moderate): Secondary | ICD-10-CM | POA: Diagnosis present

## 2016-11-16 DIAGNOSIS — E211 Secondary hyperparathyroidism, not elsewhere classified: Secondary | ICD-10-CM | POA: Diagnosis not present

## 2016-11-16 DIAGNOSIS — F411 Generalized anxiety disorder: Secondary | ICD-10-CM | POA: Diagnosis not present

## 2016-11-16 DIAGNOSIS — E209 Hypoparathyroidism, unspecified: Secondary | ICD-10-CM | POA: Diagnosis not present

## 2016-11-16 DIAGNOSIS — F319 Bipolar disorder, unspecified: Secondary | ICD-10-CM | POA: Diagnosis not present

## 2016-11-16 DIAGNOSIS — E876 Hypokalemia: Secondary | ICD-10-CM | POA: Diagnosis present

## 2016-11-16 DIAGNOSIS — R278 Other lack of coordination: Secondary | ICD-10-CM | POA: Diagnosis not present

## 2016-11-19 DIAGNOSIS — B029 Zoster without complications: Secondary | ICD-10-CM | POA: Diagnosis not present

## 2016-11-19 DIAGNOSIS — R278 Other lack of coordination: Secondary | ICD-10-CM | POA: Diagnosis not present

## 2016-11-19 DIAGNOSIS — K760 Fatty (change of) liver, not elsewhere classified: Secondary | ICD-10-CM | POA: Diagnosis not present

## 2016-11-19 DIAGNOSIS — E209 Hypoparathyroidism, unspecified: Secondary | ICD-10-CM | POA: Diagnosis not present

## 2016-11-19 DIAGNOSIS — G40409 Other generalized epilepsy and epileptic syndromes, not intractable, without status epilepticus: Secondary | ICD-10-CM | POA: Diagnosis not present

## 2016-11-19 DIAGNOSIS — M79644 Pain in right finger(s): Secondary | ICD-10-CM | POA: Diagnosis not present

## 2016-11-19 DIAGNOSIS — Z743 Need for continuous supervision: Secondary | ICD-10-CM | POA: Diagnosis not present

## 2016-11-19 DIAGNOSIS — L603 Nail dystrophy: Secondary | ICD-10-CM | POA: Diagnosis not present

## 2016-11-19 DIAGNOSIS — R079 Chest pain, unspecified: Secondary | ICD-10-CM | POA: Diagnosis not present

## 2016-11-19 DIAGNOSIS — F319 Bipolar disorder, unspecified: Secondary | ICD-10-CM | POA: Diagnosis not present

## 2016-11-19 DIAGNOSIS — R279 Unspecified lack of coordination: Secondary | ICD-10-CM | POA: Diagnosis not present

## 2016-11-19 DIAGNOSIS — M79675 Pain in left toe(s): Secondary | ICD-10-CM | POA: Diagnosis not present

## 2016-11-19 DIAGNOSIS — R2689 Other abnormalities of gait and mobility: Secondary | ICD-10-CM | POA: Diagnosis not present

## 2016-11-19 DIAGNOSIS — I482 Chronic atrial fibrillation: Secondary | ICD-10-CM | POA: Diagnosis not present

## 2016-11-19 DIAGNOSIS — K219 Gastro-esophageal reflux disease without esophagitis: Secondary | ICD-10-CM | POA: Diagnosis not present

## 2016-11-19 DIAGNOSIS — I69354 Hemiplegia and hemiparesis following cerebral infarction affecting left non-dominant side: Secondary | ICD-10-CM | POA: Diagnosis not present

## 2016-11-19 DIAGNOSIS — M6281 Muscle weakness (generalized): Secondary | ICD-10-CM | POA: Diagnosis not present

## 2016-11-19 DIAGNOSIS — B351 Tinea unguium: Secondary | ICD-10-CM | POA: Diagnosis not present

## 2016-11-19 DIAGNOSIS — N183 Chronic kidney disease, stage 3 (moderate): Secondary | ICD-10-CM | POA: Diagnosis not present

## 2016-11-19 DIAGNOSIS — F411 Generalized anxiety disorder: Secondary | ICD-10-CM | POA: Diagnosis not present

## 2016-11-19 DIAGNOSIS — R296 Repeated falls: Secondary | ICD-10-CM | POA: Diagnosis not present

## 2016-11-19 DIAGNOSIS — E876 Hypokalemia: Secondary | ICD-10-CM | POA: Diagnosis not present

## 2016-11-19 DIAGNOSIS — E039 Hypothyroidism, unspecified: Secondary | ICD-10-CM | POA: Diagnosis not present

## 2016-11-19 DIAGNOSIS — I70203 Unspecified atherosclerosis of native arteries of extremities, bilateral legs: Secondary | ICD-10-CM | POA: Diagnosis not present

## 2016-11-28 DIAGNOSIS — I482 Chronic atrial fibrillation: Secondary | ICD-10-CM | POA: Diagnosis not present

## 2016-12-11 DIAGNOSIS — B351 Tinea unguium: Secondary | ICD-10-CM | POA: Diagnosis not present

## 2016-12-11 DIAGNOSIS — L603 Nail dystrophy: Secondary | ICD-10-CM | POA: Diagnosis not present

## 2016-12-11 DIAGNOSIS — I70203 Unspecified atherosclerosis of native arteries of extremities, bilateral legs: Secondary | ICD-10-CM | POA: Diagnosis not present

## 2016-12-11 DIAGNOSIS — M79675 Pain in left toe(s): Secondary | ICD-10-CM | POA: Diagnosis not present

## 2016-12-31 DIAGNOSIS — B029 Zoster without complications: Secondary | ICD-10-CM | POA: Diagnosis not present

## 2016-12-31 DIAGNOSIS — I482 Chronic atrial fibrillation: Secondary | ICD-10-CM | POA: Diagnosis not present

## 2017-01-23 DIAGNOSIS — I482 Chronic atrial fibrillation: Secondary | ICD-10-CM | POA: Diagnosis not present

## 2017-01-23 DIAGNOSIS — B029 Zoster without complications: Secondary | ICD-10-CM | POA: Diagnosis not present

## 2017-03-20 DIAGNOSIS — I482 Chronic atrial fibrillation: Secondary | ICD-10-CM | POA: Diagnosis not present

## 2017-03-20 DIAGNOSIS — E119 Type 2 diabetes mellitus without complications: Secondary | ICD-10-CM | POA: Diagnosis not present

## 2017-03-20 DIAGNOSIS — B029 Zoster without complications: Secondary | ICD-10-CM | POA: Diagnosis not present

## 2017-03-27 DIAGNOSIS — E119 Type 2 diabetes mellitus without complications: Secondary | ICD-10-CM | POA: Diagnosis not present

## 2017-04-26 DIAGNOSIS — B351 Tinea unguium: Secondary | ICD-10-CM | POA: Diagnosis not present

## 2017-04-26 DIAGNOSIS — L603 Nail dystrophy: Secondary | ICD-10-CM | POA: Diagnosis not present

## 2017-04-26 DIAGNOSIS — I739 Peripheral vascular disease, unspecified: Secondary | ICD-10-CM | POA: Diagnosis not present

## 2017-05-18 DIAGNOSIS — I482 Chronic atrial fibrillation: Secondary | ICD-10-CM | POA: Diagnosis not present

## 2017-07-17 DIAGNOSIS — I482 Chronic atrial fibrillation: Secondary | ICD-10-CM | POA: Diagnosis not present

## 2017-07-23 DIAGNOSIS — Z5181 Encounter for therapeutic drug level monitoring: Secondary | ICD-10-CM | POA: Diagnosis not present

## 2017-07-23 DIAGNOSIS — Z79899 Other long term (current) drug therapy: Secondary | ICD-10-CM | POA: Diagnosis not present

## 2017-07-23 DIAGNOSIS — F919 Conduct disorder, unspecified: Secondary | ICD-10-CM | POA: Diagnosis not present

## 2017-08-14 DIAGNOSIS — M6281 Muscle weakness (generalized): Secondary | ICD-10-CM | POA: Diagnosis not present

## 2017-08-14 DIAGNOSIS — R2689 Other abnormalities of gait and mobility: Secondary | ICD-10-CM | POA: Diagnosis not present

## 2017-08-14 DIAGNOSIS — R278 Other lack of coordination: Secondary | ICD-10-CM | POA: Diagnosis not present

## 2017-08-15 DIAGNOSIS — R278 Other lack of coordination: Secondary | ICD-10-CM | POA: Diagnosis not present

## 2017-08-15 DIAGNOSIS — R2689 Other abnormalities of gait and mobility: Secondary | ICD-10-CM | POA: Diagnosis not present

## 2017-08-15 DIAGNOSIS — M6281 Muscle weakness (generalized): Secondary | ICD-10-CM | POA: Diagnosis not present

## 2017-08-16 DIAGNOSIS — R278 Other lack of coordination: Secondary | ICD-10-CM | POA: Diagnosis not present

## 2017-08-16 DIAGNOSIS — M6281 Muscle weakness (generalized): Secondary | ICD-10-CM | POA: Diagnosis not present

## 2017-08-16 DIAGNOSIS — R2689 Other abnormalities of gait and mobility: Secondary | ICD-10-CM | POA: Diagnosis not present

## 2017-08-17 DIAGNOSIS — R278 Other lack of coordination: Secondary | ICD-10-CM | POA: Diagnosis not present

## 2017-08-17 DIAGNOSIS — M6281 Muscle weakness (generalized): Secondary | ICD-10-CM | POA: Diagnosis not present

## 2017-08-17 DIAGNOSIS — R2689 Other abnormalities of gait and mobility: Secondary | ICD-10-CM | POA: Diagnosis not present

## 2017-08-20 DIAGNOSIS — M6281 Muscle weakness (generalized): Secondary | ICD-10-CM | POA: Diagnosis not present

## 2017-08-20 DIAGNOSIS — R2689 Other abnormalities of gait and mobility: Secondary | ICD-10-CM | POA: Diagnosis not present

## 2017-08-20 DIAGNOSIS — R278 Other lack of coordination: Secondary | ICD-10-CM | POA: Diagnosis not present

## 2017-08-21 DIAGNOSIS — M6281 Muscle weakness (generalized): Secondary | ICD-10-CM | POA: Diagnosis not present

## 2017-08-21 DIAGNOSIS — R278 Other lack of coordination: Secondary | ICD-10-CM | POA: Diagnosis not present

## 2017-08-21 DIAGNOSIS — R2689 Other abnormalities of gait and mobility: Secondary | ICD-10-CM | POA: Diagnosis not present

## 2017-08-22 DIAGNOSIS — R2689 Other abnormalities of gait and mobility: Secondary | ICD-10-CM | POA: Diagnosis not present

## 2017-08-22 DIAGNOSIS — R278 Other lack of coordination: Secondary | ICD-10-CM | POA: Diagnosis not present

## 2017-08-22 DIAGNOSIS — M6281 Muscle weakness (generalized): Secondary | ICD-10-CM | POA: Diagnosis not present

## 2017-08-23 DIAGNOSIS — R2689 Other abnormalities of gait and mobility: Secondary | ICD-10-CM | POA: Diagnosis not present

## 2017-08-23 DIAGNOSIS — R278 Other lack of coordination: Secondary | ICD-10-CM | POA: Diagnosis not present

## 2017-08-23 DIAGNOSIS — M6281 Muscle weakness (generalized): Secondary | ICD-10-CM | POA: Diagnosis not present

## 2017-08-24 DIAGNOSIS — R278 Other lack of coordination: Secondary | ICD-10-CM | POA: Diagnosis not present

## 2017-08-24 DIAGNOSIS — M6281 Muscle weakness (generalized): Secondary | ICD-10-CM | POA: Diagnosis not present

## 2017-08-24 DIAGNOSIS — R2689 Other abnormalities of gait and mobility: Secondary | ICD-10-CM | POA: Diagnosis not present

## 2017-08-27 DIAGNOSIS — M6281 Muscle weakness (generalized): Secondary | ICD-10-CM | POA: Diagnosis not present

## 2017-08-27 DIAGNOSIS — R278 Other lack of coordination: Secondary | ICD-10-CM | POA: Diagnosis not present

## 2017-08-27 DIAGNOSIS — R2689 Other abnormalities of gait and mobility: Secondary | ICD-10-CM | POA: Diagnosis not present

## 2017-08-28 DIAGNOSIS — M6281 Muscle weakness (generalized): Secondary | ICD-10-CM | POA: Diagnosis not present

## 2017-08-28 DIAGNOSIS — R278 Other lack of coordination: Secondary | ICD-10-CM | POA: Diagnosis not present

## 2017-08-28 DIAGNOSIS — R2689 Other abnormalities of gait and mobility: Secondary | ICD-10-CM | POA: Diagnosis not present

## 2017-08-29 DIAGNOSIS — M6281 Muscle weakness (generalized): Secondary | ICD-10-CM | POA: Diagnosis not present

## 2017-08-29 DIAGNOSIS — R278 Other lack of coordination: Secondary | ICD-10-CM | POA: Diagnosis not present

## 2017-08-29 DIAGNOSIS — R2689 Other abnormalities of gait and mobility: Secondary | ICD-10-CM | POA: Diagnosis not present

## 2017-08-30 DIAGNOSIS — M6281 Muscle weakness (generalized): Secondary | ICD-10-CM | POA: Diagnosis not present

## 2017-08-30 DIAGNOSIS — R278 Other lack of coordination: Secondary | ICD-10-CM | POA: Diagnosis not present

## 2017-08-30 DIAGNOSIS — R2689 Other abnormalities of gait and mobility: Secondary | ICD-10-CM | POA: Diagnosis not present

## 2017-08-31 DIAGNOSIS — R2689 Other abnormalities of gait and mobility: Secondary | ICD-10-CM | POA: Diagnosis not present

## 2017-08-31 DIAGNOSIS — M6281 Muscle weakness (generalized): Secondary | ICD-10-CM | POA: Diagnosis not present

## 2017-08-31 DIAGNOSIS — R278 Other lack of coordination: Secondary | ICD-10-CM | POA: Diagnosis not present

## 2017-09-03 DIAGNOSIS — R278 Other lack of coordination: Secondary | ICD-10-CM | POA: Diagnosis not present

## 2017-09-03 DIAGNOSIS — M6281 Muscle weakness (generalized): Secondary | ICD-10-CM | POA: Diagnosis not present

## 2017-09-03 DIAGNOSIS — R2689 Other abnormalities of gait and mobility: Secondary | ICD-10-CM | POA: Diagnosis not present

## 2017-09-04 DIAGNOSIS — R2689 Other abnormalities of gait and mobility: Secondary | ICD-10-CM | POA: Diagnosis not present

## 2017-09-04 DIAGNOSIS — R278 Other lack of coordination: Secondary | ICD-10-CM | POA: Diagnosis not present

## 2017-09-04 DIAGNOSIS — M6281 Muscle weakness (generalized): Secondary | ICD-10-CM | POA: Diagnosis not present

## 2017-09-04 DIAGNOSIS — I482 Chronic atrial fibrillation: Secondary | ICD-10-CM | POA: Diagnosis not present

## 2017-09-05 DIAGNOSIS — R278 Other lack of coordination: Secondary | ICD-10-CM | POA: Diagnosis not present

## 2017-09-05 DIAGNOSIS — R2689 Other abnormalities of gait and mobility: Secondary | ICD-10-CM | POA: Diagnosis not present

## 2017-09-05 DIAGNOSIS — M6281 Muscle weakness (generalized): Secondary | ICD-10-CM | POA: Diagnosis not present

## 2017-09-10 DIAGNOSIS — R278 Other lack of coordination: Secondary | ICD-10-CM | POA: Diagnosis not present

## 2017-09-10 DIAGNOSIS — R2689 Other abnormalities of gait and mobility: Secondary | ICD-10-CM | POA: Diagnosis not present

## 2017-09-10 DIAGNOSIS — M6281 Muscle weakness (generalized): Secondary | ICD-10-CM | POA: Diagnosis not present

## 2017-09-11 DIAGNOSIS — R278 Other lack of coordination: Secondary | ICD-10-CM | POA: Diagnosis not present

## 2017-09-11 DIAGNOSIS — M6281 Muscle weakness (generalized): Secondary | ICD-10-CM | POA: Diagnosis not present

## 2017-09-11 DIAGNOSIS — R2689 Other abnormalities of gait and mobility: Secondary | ICD-10-CM | POA: Diagnosis not present

## 2017-09-12 DIAGNOSIS — R278 Other lack of coordination: Secondary | ICD-10-CM | POA: Diagnosis not present

## 2017-09-12 DIAGNOSIS — M6281 Muscle weakness (generalized): Secondary | ICD-10-CM | POA: Diagnosis not present

## 2017-09-12 DIAGNOSIS — R2689 Other abnormalities of gait and mobility: Secondary | ICD-10-CM | POA: Diagnosis not present

## 2017-09-13 DIAGNOSIS — M6281 Muscle weakness (generalized): Secondary | ICD-10-CM | POA: Diagnosis not present

## 2017-09-13 DIAGNOSIS — R278 Other lack of coordination: Secondary | ICD-10-CM | POA: Diagnosis not present

## 2017-09-13 DIAGNOSIS — R2689 Other abnormalities of gait and mobility: Secondary | ICD-10-CM | POA: Diagnosis not present

## 2017-09-14 DIAGNOSIS — R2689 Other abnormalities of gait and mobility: Secondary | ICD-10-CM | POA: Diagnosis not present

## 2017-09-14 DIAGNOSIS — R278 Other lack of coordination: Secondary | ICD-10-CM | POA: Diagnosis not present

## 2017-09-14 DIAGNOSIS — M6281 Muscle weakness (generalized): Secondary | ICD-10-CM | POA: Diagnosis not present

## 2017-09-15 DIAGNOSIS — M6281 Muscle weakness (generalized): Secondary | ICD-10-CM | POA: Diagnosis not present

## 2017-09-15 DIAGNOSIS — R278 Other lack of coordination: Secondary | ICD-10-CM | POA: Diagnosis not present

## 2017-09-15 DIAGNOSIS — R2689 Other abnormalities of gait and mobility: Secondary | ICD-10-CM | POA: Diagnosis not present

## 2017-09-17 DIAGNOSIS — R278 Other lack of coordination: Secondary | ICD-10-CM | POA: Diagnosis not present

## 2017-09-17 DIAGNOSIS — M6281 Muscle weakness (generalized): Secondary | ICD-10-CM | POA: Diagnosis not present

## 2017-09-17 DIAGNOSIS — R2689 Other abnormalities of gait and mobility: Secondary | ICD-10-CM | POA: Diagnosis not present

## 2017-09-18 DIAGNOSIS — R278 Other lack of coordination: Secondary | ICD-10-CM | POA: Diagnosis not present

## 2017-09-18 DIAGNOSIS — M6281 Muscle weakness (generalized): Secondary | ICD-10-CM | POA: Diagnosis not present

## 2017-09-18 DIAGNOSIS — R2689 Other abnormalities of gait and mobility: Secondary | ICD-10-CM | POA: Diagnosis not present

## 2017-09-19 DIAGNOSIS — R2689 Other abnormalities of gait and mobility: Secondary | ICD-10-CM | POA: Diagnosis not present

## 2017-09-19 DIAGNOSIS — M6281 Muscle weakness (generalized): Secondary | ICD-10-CM | POA: Diagnosis not present

## 2017-09-19 DIAGNOSIS — R278 Other lack of coordination: Secondary | ICD-10-CM | POA: Diagnosis not present

## 2017-09-20 DIAGNOSIS — M6281 Muscle weakness (generalized): Secondary | ICD-10-CM | POA: Diagnosis not present

## 2017-09-20 DIAGNOSIS — R2689 Other abnormalities of gait and mobility: Secondary | ICD-10-CM | POA: Diagnosis not present

## 2017-09-20 DIAGNOSIS — R278 Other lack of coordination: Secondary | ICD-10-CM | POA: Diagnosis not present

## 2017-09-21 DIAGNOSIS — M6281 Muscle weakness (generalized): Secondary | ICD-10-CM | POA: Diagnosis not present

## 2017-09-21 DIAGNOSIS — R278 Other lack of coordination: Secondary | ICD-10-CM | POA: Diagnosis not present

## 2017-09-21 DIAGNOSIS — R2689 Other abnormalities of gait and mobility: Secondary | ICD-10-CM | POA: Diagnosis not present

## 2017-09-24 DIAGNOSIS — M6281 Muscle weakness (generalized): Secondary | ICD-10-CM | POA: Diagnosis not present

## 2017-09-24 DIAGNOSIS — R278 Other lack of coordination: Secondary | ICD-10-CM | POA: Diagnosis not present

## 2017-09-24 DIAGNOSIS — R2689 Other abnormalities of gait and mobility: Secondary | ICD-10-CM | POA: Diagnosis not present

## 2017-09-25 DIAGNOSIS — M6281 Muscle weakness (generalized): Secondary | ICD-10-CM | POA: Diagnosis not present

## 2017-09-25 DIAGNOSIS — R2689 Other abnormalities of gait and mobility: Secondary | ICD-10-CM | POA: Diagnosis not present

## 2017-09-25 DIAGNOSIS — R278 Other lack of coordination: Secondary | ICD-10-CM | POA: Diagnosis not present

## 2017-09-26 DIAGNOSIS — R278 Other lack of coordination: Secondary | ICD-10-CM | POA: Diagnosis not present

## 2017-09-26 DIAGNOSIS — M6281 Muscle weakness (generalized): Secondary | ICD-10-CM | POA: Diagnosis not present

## 2017-09-26 DIAGNOSIS — R2689 Other abnormalities of gait and mobility: Secondary | ICD-10-CM | POA: Diagnosis not present

## 2017-09-27 DIAGNOSIS — R278 Other lack of coordination: Secondary | ICD-10-CM | POA: Diagnosis not present

## 2017-09-27 DIAGNOSIS — M6281 Muscle weakness (generalized): Secondary | ICD-10-CM | POA: Diagnosis not present

## 2017-09-27 DIAGNOSIS — R2689 Other abnormalities of gait and mobility: Secondary | ICD-10-CM | POA: Diagnosis not present

## 2017-09-28 DIAGNOSIS — M6281 Muscle weakness (generalized): Secondary | ICD-10-CM | POA: Diagnosis not present

## 2017-09-28 DIAGNOSIS — R2689 Other abnormalities of gait and mobility: Secondary | ICD-10-CM | POA: Diagnosis not present

## 2017-09-28 DIAGNOSIS — R278 Other lack of coordination: Secondary | ICD-10-CM | POA: Diagnosis not present

## 2017-10-01 DIAGNOSIS — R278 Other lack of coordination: Secondary | ICD-10-CM | POA: Diagnosis not present

## 2017-10-01 DIAGNOSIS — R2689 Other abnormalities of gait and mobility: Secondary | ICD-10-CM | POA: Diagnosis not present

## 2017-10-01 DIAGNOSIS — M6281 Muscle weakness (generalized): Secondary | ICD-10-CM | POA: Diagnosis not present

## 2017-10-02 DIAGNOSIS — R278 Other lack of coordination: Secondary | ICD-10-CM | POA: Diagnosis not present

## 2017-10-02 DIAGNOSIS — M6281 Muscle weakness (generalized): Secondary | ICD-10-CM | POA: Diagnosis not present

## 2017-10-02 DIAGNOSIS — R2689 Other abnormalities of gait and mobility: Secondary | ICD-10-CM | POA: Diagnosis not present

## 2017-10-03 DIAGNOSIS — R278 Other lack of coordination: Secondary | ICD-10-CM | POA: Diagnosis not present

## 2017-10-03 DIAGNOSIS — M6281 Muscle weakness (generalized): Secondary | ICD-10-CM | POA: Diagnosis not present

## 2017-10-03 DIAGNOSIS — R2689 Other abnormalities of gait and mobility: Secondary | ICD-10-CM | POA: Diagnosis not present

## 2017-10-04 DIAGNOSIS — R2689 Other abnormalities of gait and mobility: Secondary | ICD-10-CM | POA: Diagnosis not present

## 2017-10-04 DIAGNOSIS — R278 Other lack of coordination: Secondary | ICD-10-CM | POA: Diagnosis not present

## 2017-10-04 DIAGNOSIS — M6281 Muscle weakness (generalized): Secondary | ICD-10-CM | POA: Diagnosis not present

## 2017-10-05 DIAGNOSIS — R278 Other lack of coordination: Secondary | ICD-10-CM | POA: Diagnosis not present

## 2017-10-05 DIAGNOSIS — M6281 Muscle weakness (generalized): Secondary | ICD-10-CM | POA: Diagnosis not present

## 2017-10-05 DIAGNOSIS — R2689 Other abnormalities of gait and mobility: Secondary | ICD-10-CM | POA: Diagnosis not present

## 2017-10-08 DIAGNOSIS — R278 Other lack of coordination: Secondary | ICD-10-CM | POA: Diagnosis not present

## 2017-10-08 DIAGNOSIS — M6281 Muscle weakness (generalized): Secondary | ICD-10-CM | POA: Diagnosis not present

## 2017-10-08 DIAGNOSIS — R2689 Other abnormalities of gait and mobility: Secondary | ICD-10-CM | POA: Diagnosis not present

## 2017-10-09 DIAGNOSIS — R278 Other lack of coordination: Secondary | ICD-10-CM | POA: Diagnosis not present

## 2017-10-09 DIAGNOSIS — R2689 Other abnormalities of gait and mobility: Secondary | ICD-10-CM | POA: Diagnosis not present

## 2017-10-09 DIAGNOSIS — M6281 Muscle weakness (generalized): Secondary | ICD-10-CM | POA: Diagnosis not present

## 2017-10-10 DIAGNOSIS — R278 Other lack of coordination: Secondary | ICD-10-CM | POA: Diagnosis not present

## 2017-10-10 DIAGNOSIS — R2689 Other abnormalities of gait and mobility: Secondary | ICD-10-CM | POA: Diagnosis not present

## 2017-10-10 DIAGNOSIS — M6281 Muscle weakness (generalized): Secondary | ICD-10-CM | POA: Diagnosis not present

## 2017-10-11 DIAGNOSIS — R278 Other lack of coordination: Secondary | ICD-10-CM | POA: Diagnosis not present

## 2017-10-11 DIAGNOSIS — E039 Hypothyroidism, unspecified: Secondary | ICD-10-CM | POA: Diagnosis not present

## 2017-10-11 DIAGNOSIS — R2689 Other abnormalities of gait and mobility: Secondary | ICD-10-CM | POA: Diagnosis not present

## 2017-10-11 DIAGNOSIS — M6281 Muscle weakness (generalized): Secondary | ICD-10-CM | POA: Diagnosis not present

## 2017-10-12 DIAGNOSIS — R2689 Other abnormalities of gait and mobility: Secondary | ICD-10-CM | POA: Diagnosis not present

## 2017-10-12 DIAGNOSIS — M6281 Muscle weakness (generalized): Secondary | ICD-10-CM | POA: Diagnosis not present

## 2017-10-12 DIAGNOSIS — R278 Other lack of coordination: Secondary | ICD-10-CM | POA: Diagnosis not present

## 2017-10-15 DIAGNOSIS — R2689 Other abnormalities of gait and mobility: Secondary | ICD-10-CM | POA: Diagnosis not present

## 2017-10-15 DIAGNOSIS — M6281 Muscle weakness (generalized): Secondary | ICD-10-CM | POA: Diagnosis not present

## 2017-10-15 DIAGNOSIS — R278 Other lack of coordination: Secondary | ICD-10-CM | POA: Diagnosis not present

## 2017-10-16 DIAGNOSIS — M6281 Muscle weakness (generalized): Secondary | ICD-10-CM | POA: Diagnosis not present

## 2017-10-16 DIAGNOSIS — R2689 Other abnormalities of gait and mobility: Secondary | ICD-10-CM | POA: Diagnosis not present

## 2017-10-16 DIAGNOSIS — R278 Other lack of coordination: Secondary | ICD-10-CM | POA: Diagnosis not present

## 2017-10-17 DIAGNOSIS — M6281 Muscle weakness (generalized): Secondary | ICD-10-CM | POA: Diagnosis not present

## 2017-10-17 DIAGNOSIS — R278 Other lack of coordination: Secondary | ICD-10-CM | POA: Diagnosis not present

## 2017-10-17 DIAGNOSIS — R2689 Other abnormalities of gait and mobility: Secondary | ICD-10-CM | POA: Diagnosis not present

## 2017-10-18 DIAGNOSIS — R2689 Other abnormalities of gait and mobility: Secondary | ICD-10-CM | POA: Diagnosis not present

## 2017-10-18 DIAGNOSIS — R278 Other lack of coordination: Secondary | ICD-10-CM | POA: Diagnosis not present

## 2017-10-18 DIAGNOSIS — M6281 Muscle weakness (generalized): Secondary | ICD-10-CM | POA: Diagnosis not present

## 2017-10-19 DIAGNOSIS — R278 Other lack of coordination: Secondary | ICD-10-CM | POA: Diagnosis not present

## 2017-10-19 DIAGNOSIS — M6281 Muscle weakness (generalized): Secondary | ICD-10-CM | POA: Diagnosis not present

## 2017-10-19 DIAGNOSIS — R2689 Other abnormalities of gait and mobility: Secondary | ICD-10-CM | POA: Diagnosis not present

## 2017-10-22 DIAGNOSIS — M6281 Muscle weakness (generalized): Secondary | ICD-10-CM | POA: Diagnosis not present

## 2017-10-22 DIAGNOSIS — R2689 Other abnormalities of gait and mobility: Secondary | ICD-10-CM | POA: Diagnosis not present

## 2017-10-22 DIAGNOSIS — R278 Other lack of coordination: Secondary | ICD-10-CM | POA: Diagnosis not present

## 2017-10-23 DIAGNOSIS — M6281 Muscle weakness (generalized): Secondary | ICD-10-CM | POA: Diagnosis not present

## 2017-10-23 DIAGNOSIS — R278 Other lack of coordination: Secondary | ICD-10-CM | POA: Diagnosis not present

## 2017-10-23 DIAGNOSIS — R2689 Other abnormalities of gait and mobility: Secondary | ICD-10-CM | POA: Diagnosis not present

## 2017-10-24 DIAGNOSIS — R2689 Other abnormalities of gait and mobility: Secondary | ICD-10-CM | POA: Diagnosis not present

## 2017-10-24 DIAGNOSIS — M6281 Muscle weakness (generalized): Secondary | ICD-10-CM | POA: Diagnosis not present

## 2017-10-24 DIAGNOSIS — R278 Other lack of coordination: Secondary | ICD-10-CM | POA: Diagnosis not present

## 2017-10-25 DIAGNOSIS — R278 Other lack of coordination: Secondary | ICD-10-CM | POA: Diagnosis not present

## 2017-10-25 DIAGNOSIS — R2689 Other abnormalities of gait and mobility: Secondary | ICD-10-CM | POA: Diagnosis not present

## 2017-10-25 DIAGNOSIS — M6281 Muscle weakness (generalized): Secondary | ICD-10-CM | POA: Diagnosis not present

## 2017-10-26 DIAGNOSIS — M6281 Muscle weakness (generalized): Secondary | ICD-10-CM | POA: Diagnosis not present

## 2017-10-26 DIAGNOSIS — R2689 Other abnormalities of gait and mobility: Secondary | ICD-10-CM | POA: Diagnosis not present

## 2017-10-26 DIAGNOSIS — R278 Other lack of coordination: Secondary | ICD-10-CM | POA: Diagnosis not present

## 2017-10-28 DIAGNOSIS — I482 Chronic atrial fibrillation: Secondary | ICD-10-CM | POA: Diagnosis not present

## 2017-10-29 DIAGNOSIS — R278 Other lack of coordination: Secondary | ICD-10-CM | POA: Diagnosis not present

## 2017-10-29 DIAGNOSIS — M6281 Muscle weakness (generalized): Secondary | ICD-10-CM | POA: Diagnosis not present

## 2017-10-29 DIAGNOSIS — R2689 Other abnormalities of gait and mobility: Secondary | ICD-10-CM | POA: Diagnosis not present

## 2017-10-30 DIAGNOSIS — R2689 Other abnormalities of gait and mobility: Secondary | ICD-10-CM | POA: Diagnosis not present

## 2017-10-30 DIAGNOSIS — R278 Other lack of coordination: Secondary | ICD-10-CM | POA: Diagnosis not present

## 2017-10-30 DIAGNOSIS — I739 Peripheral vascular disease, unspecified: Secondary | ICD-10-CM | POA: Diagnosis not present

## 2017-10-30 DIAGNOSIS — M6281 Muscle weakness (generalized): Secondary | ICD-10-CM | POA: Diagnosis not present

## 2017-10-30 DIAGNOSIS — L603 Nail dystrophy: Secondary | ICD-10-CM | POA: Diagnosis not present

## 2017-10-31 DIAGNOSIS — N6322 Unspecified lump in the left breast, upper inner quadrant: Secondary | ICD-10-CM | POA: Diagnosis not present

## 2017-10-31 DIAGNOSIS — N6324 Unspecified lump in the left breast, lower inner quadrant: Secondary | ICD-10-CM | POA: Diagnosis not present

## 2017-10-31 DIAGNOSIS — M6281 Muscle weakness (generalized): Secondary | ICD-10-CM | POA: Diagnosis not present

## 2017-10-31 DIAGNOSIS — R2689 Other abnormalities of gait and mobility: Secondary | ICD-10-CM | POA: Diagnosis not present

## 2017-10-31 DIAGNOSIS — R278 Other lack of coordination: Secondary | ICD-10-CM | POA: Diagnosis not present

## 2017-10-31 DIAGNOSIS — R928 Other abnormal and inconclusive findings on diagnostic imaging of breast: Secondary | ICD-10-CM | POA: Diagnosis not present

## 2017-11-01 DIAGNOSIS — R2689 Other abnormalities of gait and mobility: Secondary | ICD-10-CM | POA: Diagnosis not present

## 2017-11-01 DIAGNOSIS — M6281 Muscle weakness (generalized): Secondary | ICD-10-CM | POA: Diagnosis not present

## 2017-11-01 DIAGNOSIS — R278 Other lack of coordination: Secondary | ICD-10-CM | POA: Diagnosis not present

## 2017-11-02 DIAGNOSIS — R2689 Other abnormalities of gait and mobility: Secondary | ICD-10-CM | POA: Diagnosis not present

## 2017-11-02 DIAGNOSIS — R278 Other lack of coordination: Secondary | ICD-10-CM | POA: Diagnosis not present

## 2017-11-02 DIAGNOSIS — M6281 Muscle weakness (generalized): Secondary | ICD-10-CM | POA: Diagnosis not present

## 2017-11-05 DIAGNOSIS — M6281 Muscle weakness (generalized): Secondary | ICD-10-CM | POA: Diagnosis not present

## 2017-11-05 DIAGNOSIS — R278 Other lack of coordination: Secondary | ICD-10-CM | POA: Diagnosis not present

## 2017-11-05 DIAGNOSIS — R2689 Other abnormalities of gait and mobility: Secondary | ICD-10-CM | POA: Diagnosis not present

## 2017-11-06 DIAGNOSIS — R2689 Other abnormalities of gait and mobility: Secondary | ICD-10-CM | POA: Diagnosis not present

## 2017-11-06 DIAGNOSIS — R278 Other lack of coordination: Secondary | ICD-10-CM | POA: Diagnosis not present

## 2017-11-06 DIAGNOSIS — M6281 Muscle weakness (generalized): Secondary | ICD-10-CM | POA: Diagnosis not present

## 2017-11-07 DIAGNOSIS — R2689 Other abnormalities of gait and mobility: Secondary | ICD-10-CM | POA: Diagnosis not present

## 2017-11-07 DIAGNOSIS — R278 Other lack of coordination: Secondary | ICD-10-CM | POA: Diagnosis not present

## 2017-11-07 DIAGNOSIS — M6281 Muscle weakness (generalized): Secondary | ICD-10-CM | POA: Diagnosis not present

## 2017-11-08 DIAGNOSIS — M6281 Muscle weakness (generalized): Secondary | ICD-10-CM | POA: Diagnosis not present

## 2017-11-08 DIAGNOSIS — R2689 Other abnormalities of gait and mobility: Secondary | ICD-10-CM | POA: Diagnosis not present

## 2017-11-08 DIAGNOSIS — R278 Other lack of coordination: Secondary | ICD-10-CM | POA: Diagnosis not present

## 2017-11-09 DIAGNOSIS — R2689 Other abnormalities of gait and mobility: Secondary | ICD-10-CM | POA: Diagnosis not present

## 2017-11-09 DIAGNOSIS — M6281 Muscle weakness (generalized): Secondary | ICD-10-CM | POA: Diagnosis not present

## 2017-11-09 DIAGNOSIS — R278 Other lack of coordination: Secondary | ICD-10-CM | POA: Diagnosis not present

## 2017-11-12 DIAGNOSIS — R2689 Other abnormalities of gait and mobility: Secondary | ICD-10-CM | POA: Diagnosis not present

## 2017-11-12 DIAGNOSIS — M6281 Muscle weakness (generalized): Secondary | ICD-10-CM | POA: Diagnosis not present

## 2017-11-12 DIAGNOSIS — R278 Other lack of coordination: Secondary | ICD-10-CM | POA: Diagnosis not present

## 2017-11-13 DIAGNOSIS — M6281 Muscle weakness (generalized): Secondary | ICD-10-CM | POA: Diagnosis not present

## 2017-11-13 DIAGNOSIS — R2689 Other abnormalities of gait and mobility: Secondary | ICD-10-CM | POA: Diagnosis not present

## 2017-11-13 DIAGNOSIS — R278 Other lack of coordination: Secondary | ICD-10-CM | POA: Diagnosis not present

## 2017-11-14 DIAGNOSIS — M6281 Muscle weakness (generalized): Secondary | ICD-10-CM | POA: Diagnosis not present

## 2017-11-14 DIAGNOSIS — R278 Other lack of coordination: Secondary | ICD-10-CM | POA: Diagnosis not present

## 2017-11-14 DIAGNOSIS — R2689 Other abnormalities of gait and mobility: Secondary | ICD-10-CM | POA: Diagnosis not present

## 2017-11-15 DIAGNOSIS — M6281 Muscle weakness (generalized): Secondary | ICD-10-CM | POA: Diagnosis not present

## 2017-11-15 DIAGNOSIS — R2689 Other abnormalities of gait and mobility: Secondary | ICD-10-CM | POA: Diagnosis not present

## 2017-11-15 DIAGNOSIS — R278 Other lack of coordination: Secondary | ICD-10-CM | POA: Diagnosis not present

## 2017-11-16 DIAGNOSIS — R2689 Other abnormalities of gait and mobility: Secondary | ICD-10-CM | POA: Diagnosis not present

## 2017-11-16 DIAGNOSIS — R278 Other lack of coordination: Secondary | ICD-10-CM | POA: Diagnosis not present

## 2017-11-16 DIAGNOSIS — M6281 Muscle weakness (generalized): Secondary | ICD-10-CM | POA: Diagnosis not present

## 2017-11-17 DIAGNOSIS — M6281 Muscle weakness (generalized): Secondary | ICD-10-CM | POA: Diagnosis not present

## 2017-11-17 DIAGNOSIS — R278 Other lack of coordination: Secondary | ICD-10-CM | POA: Diagnosis not present

## 2017-11-17 DIAGNOSIS — R2689 Other abnormalities of gait and mobility: Secondary | ICD-10-CM | POA: Diagnosis not present

## 2017-11-19 DIAGNOSIS — R2689 Other abnormalities of gait and mobility: Secondary | ICD-10-CM | POA: Diagnosis not present

## 2017-11-19 DIAGNOSIS — R278 Other lack of coordination: Secondary | ICD-10-CM | POA: Diagnosis not present

## 2017-11-19 DIAGNOSIS — M6281 Muscle weakness (generalized): Secondary | ICD-10-CM | POA: Diagnosis not present

## 2017-11-20 DIAGNOSIS — R278 Other lack of coordination: Secondary | ICD-10-CM | POA: Diagnosis not present

## 2017-11-20 DIAGNOSIS — R2689 Other abnormalities of gait and mobility: Secondary | ICD-10-CM | POA: Diagnosis not present

## 2017-11-20 DIAGNOSIS — M6281 Muscle weakness (generalized): Secondary | ICD-10-CM | POA: Diagnosis not present

## 2017-11-21 DIAGNOSIS — M6281 Muscle weakness (generalized): Secondary | ICD-10-CM | POA: Diagnosis not present

## 2017-11-21 DIAGNOSIS — R2689 Other abnormalities of gait and mobility: Secondary | ICD-10-CM | POA: Diagnosis not present

## 2017-11-21 DIAGNOSIS — R278 Other lack of coordination: Secondary | ICD-10-CM | POA: Diagnosis not present

## 2017-11-22 DIAGNOSIS — R2689 Other abnormalities of gait and mobility: Secondary | ICD-10-CM | POA: Diagnosis not present

## 2017-11-22 DIAGNOSIS — M6281 Muscle weakness (generalized): Secondary | ICD-10-CM | POA: Diagnosis not present

## 2017-11-22 DIAGNOSIS — R278 Other lack of coordination: Secondary | ICD-10-CM | POA: Diagnosis not present

## 2017-11-23 DIAGNOSIS — R278 Other lack of coordination: Secondary | ICD-10-CM | POA: Diagnosis not present

## 2017-11-23 DIAGNOSIS — R2689 Other abnormalities of gait and mobility: Secondary | ICD-10-CM | POA: Diagnosis not present

## 2017-11-23 DIAGNOSIS — M6281 Muscle weakness (generalized): Secondary | ICD-10-CM | POA: Diagnosis not present

## 2017-11-23 DIAGNOSIS — E039 Hypothyroidism, unspecified: Secondary | ICD-10-CM | POA: Diagnosis not present

## 2017-11-24 DIAGNOSIS — M6281 Muscle weakness (generalized): Secondary | ICD-10-CM | POA: Diagnosis not present

## 2017-11-24 DIAGNOSIS — R2689 Other abnormalities of gait and mobility: Secondary | ICD-10-CM | POA: Diagnosis not present

## 2017-11-24 DIAGNOSIS — R278 Other lack of coordination: Secondary | ICD-10-CM | POA: Diagnosis not present

## 2017-11-26 DIAGNOSIS — M6281 Muscle weakness (generalized): Secondary | ICD-10-CM | POA: Diagnosis not present

## 2017-11-26 DIAGNOSIS — R278 Other lack of coordination: Secondary | ICD-10-CM | POA: Diagnosis not present

## 2017-11-26 DIAGNOSIS — R2689 Other abnormalities of gait and mobility: Secondary | ICD-10-CM | POA: Diagnosis not present

## 2017-11-27 DIAGNOSIS — M6281 Muscle weakness (generalized): Secondary | ICD-10-CM | POA: Diagnosis not present

## 2017-11-27 DIAGNOSIS — R278 Other lack of coordination: Secondary | ICD-10-CM | POA: Diagnosis not present

## 2017-11-27 DIAGNOSIS — R2689 Other abnormalities of gait and mobility: Secondary | ICD-10-CM | POA: Diagnosis not present

## 2017-11-28 DIAGNOSIS — R278 Other lack of coordination: Secondary | ICD-10-CM | POA: Diagnosis not present

## 2017-11-28 DIAGNOSIS — M6281 Muscle weakness (generalized): Secondary | ICD-10-CM | POA: Diagnosis not present

## 2017-11-28 DIAGNOSIS — R2689 Other abnormalities of gait and mobility: Secondary | ICD-10-CM | POA: Diagnosis not present

## 2017-11-29 DIAGNOSIS — R2689 Other abnormalities of gait and mobility: Secondary | ICD-10-CM | POA: Diagnosis not present

## 2017-11-29 DIAGNOSIS — M6281 Muscle weakness (generalized): Secondary | ICD-10-CM | POA: Diagnosis not present

## 2017-11-29 DIAGNOSIS — R278 Other lack of coordination: Secondary | ICD-10-CM | POA: Diagnosis not present

## 2017-11-30 DIAGNOSIS — R2689 Other abnormalities of gait and mobility: Secondary | ICD-10-CM | POA: Diagnosis not present

## 2017-11-30 DIAGNOSIS — M6281 Muscle weakness (generalized): Secondary | ICD-10-CM | POA: Diagnosis not present

## 2017-11-30 DIAGNOSIS — R278 Other lack of coordination: Secondary | ICD-10-CM | POA: Diagnosis not present

## 2017-12-03 DIAGNOSIS — M6281 Muscle weakness (generalized): Secondary | ICD-10-CM | POA: Diagnosis not present

## 2017-12-03 DIAGNOSIS — R278 Other lack of coordination: Secondary | ICD-10-CM | POA: Diagnosis not present

## 2017-12-03 DIAGNOSIS — R2689 Other abnormalities of gait and mobility: Secondary | ICD-10-CM | POA: Diagnosis not present

## 2017-12-04 DIAGNOSIS — M6281 Muscle weakness (generalized): Secondary | ICD-10-CM | POA: Diagnosis not present

## 2017-12-04 DIAGNOSIS — R2689 Other abnormalities of gait and mobility: Secondary | ICD-10-CM | POA: Diagnosis not present

## 2017-12-04 DIAGNOSIS — R278 Other lack of coordination: Secondary | ICD-10-CM | POA: Diagnosis not present

## 2017-12-05 DIAGNOSIS — R278 Other lack of coordination: Secondary | ICD-10-CM | POA: Diagnosis not present

## 2017-12-05 DIAGNOSIS — M6281 Muscle weakness (generalized): Secondary | ICD-10-CM | POA: Diagnosis not present

## 2017-12-05 DIAGNOSIS — R2689 Other abnormalities of gait and mobility: Secondary | ICD-10-CM | POA: Diagnosis not present

## 2017-12-06 DIAGNOSIS — R278 Other lack of coordination: Secondary | ICD-10-CM | POA: Diagnosis not present

## 2017-12-06 DIAGNOSIS — R2689 Other abnormalities of gait and mobility: Secondary | ICD-10-CM | POA: Diagnosis not present

## 2017-12-06 DIAGNOSIS — M6281 Muscle weakness (generalized): Secondary | ICD-10-CM | POA: Diagnosis not present

## 2017-12-07 DIAGNOSIS — R2689 Other abnormalities of gait and mobility: Secondary | ICD-10-CM | POA: Diagnosis not present

## 2017-12-07 DIAGNOSIS — M6281 Muscle weakness (generalized): Secondary | ICD-10-CM | POA: Diagnosis not present

## 2017-12-10 DIAGNOSIS — M6281 Muscle weakness (generalized): Secondary | ICD-10-CM | POA: Diagnosis not present

## 2017-12-10 DIAGNOSIS — R2689 Other abnormalities of gait and mobility: Secondary | ICD-10-CM | POA: Diagnosis not present

## 2017-12-11 DIAGNOSIS — R2689 Other abnormalities of gait and mobility: Secondary | ICD-10-CM | POA: Diagnosis not present

## 2017-12-11 DIAGNOSIS — M6281 Muscle weakness (generalized): Secondary | ICD-10-CM | POA: Diagnosis not present

## 2017-12-12 DIAGNOSIS — R2689 Other abnormalities of gait and mobility: Secondary | ICD-10-CM | POA: Diagnosis not present

## 2017-12-12 DIAGNOSIS — M6281 Muscle weakness (generalized): Secondary | ICD-10-CM | POA: Diagnosis not present

## 2017-12-13 DIAGNOSIS — R2689 Other abnormalities of gait and mobility: Secondary | ICD-10-CM | POA: Diagnosis not present

## 2017-12-13 DIAGNOSIS — M6281 Muscle weakness (generalized): Secondary | ICD-10-CM | POA: Diagnosis not present

## 2017-12-14 DIAGNOSIS — R2689 Other abnormalities of gait and mobility: Secondary | ICD-10-CM | POA: Diagnosis not present

## 2017-12-14 DIAGNOSIS — M6281 Muscle weakness (generalized): Secondary | ICD-10-CM | POA: Diagnosis not present

## 2017-12-15 DIAGNOSIS — I482 Chronic atrial fibrillation: Secondary | ICD-10-CM | POA: Diagnosis not present

## 2017-12-17 DIAGNOSIS — M6281 Muscle weakness (generalized): Secondary | ICD-10-CM | POA: Diagnosis not present

## 2017-12-17 DIAGNOSIS — R2689 Other abnormalities of gait and mobility: Secondary | ICD-10-CM | POA: Diagnosis not present

## 2017-12-18 DIAGNOSIS — R2689 Other abnormalities of gait and mobility: Secondary | ICD-10-CM | POA: Diagnosis not present

## 2017-12-18 DIAGNOSIS — M6281 Muscle weakness (generalized): Secondary | ICD-10-CM | POA: Diagnosis not present

## 2017-12-19 DIAGNOSIS — M6281 Muscle weakness (generalized): Secondary | ICD-10-CM | POA: Diagnosis not present

## 2017-12-19 DIAGNOSIS — R2689 Other abnormalities of gait and mobility: Secondary | ICD-10-CM | POA: Diagnosis not present

## 2017-12-20 DIAGNOSIS — R2689 Other abnormalities of gait and mobility: Secondary | ICD-10-CM | POA: Diagnosis not present

## 2017-12-20 DIAGNOSIS — M6281 Muscle weakness (generalized): Secondary | ICD-10-CM | POA: Diagnosis not present

## 2017-12-21 DIAGNOSIS — M6281 Muscle weakness (generalized): Secondary | ICD-10-CM | POA: Diagnosis not present

## 2017-12-21 DIAGNOSIS — R2689 Other abnormalities of gait and mobility: Secondary | ICD-10-CM | POA: Diagnosis not present

## 2017-12-24 DIAGNOSIS — R2689 Other abnormalities of gait and mobility: Secondary | ICD-10-CM | POA: Diagnosis not present

## 2017-12-24 DIAGNOSIS — M6281 Muscle weakness (generalized): Secondary | ICD-10-CM | POA: Diagnosis not present

## 2017-12-25 DIAGNOSIS — M6281 Muscle weakness (generalized): Secondary | ICD-10-CM | POA: Diagnosis not present

## 2017-12-25 DIAGNOSIS — R2689 Other abnormalities of gait and mobility: Secondary | ICD-10-CM | POA: Diagnosis not present

## 2017-12-26 DIAGNOSIS — R2689 Other abnormalities of gait and mobility: Secondary | ICD-10-CM | POA: Diagnosis not present

## 2017-12-26 DIAGNOSIS — M6281 Muscle weakness (generalized): Secondary | ICD-10-CM | POA: Diagnosis not present

## 2017-12-27 DIAGNOSIS — R2689 Other abnormalities of gait and mobility: Secondary | ICD-10-CM | POA: Diagnosis not present

## 2017-12-27 DIAGNOSIS — M6281 Muscle weakness (generalized): Secondary | ICD-10-CM | POA: Diagnosis not present

## 2017-12-28 DIAGNOSIS — R2689 Other abnormalities of gait and mobility: Secondary | ICD-10-CM | POA: Diagnosis not present

## 2017-12-28 DIAGNOSIS — M6281 Muscle weakness (generalized): Secondary | ICD-10-CM | POA: Diagnosis not present

## 2017-12-31 DIAGNOSIS — M6281 Muscle weakness (generalized): Secondary | ICD-10-CM | POA: Diagnosis not present

## 2017-12-31 DIAGNOSIS — R2689 Other abnormalities of gait and mobility: Secondary | ICD-10-CM | POA: Diagnosis not present

## 2018-01-01 DIAGNOSIS — R2689 Other abnormalities of gait and mobility: Secondary | ICD-10-CM | POA: Diagnosis not present

## 2018-01-01 DIAGNOSIS — M6281 Muscle weakness (generalized): Secondary | ICD-10-CM | POA: Diagnosis not present

## 2018-01-02 DIAGNOSIS — M6281 Muscle weakness (generalized): Secondary | ICD-10-CM | POA: Diagnosis not present

## 2018-01-02 DIAGNOSIS — R2689 Other abnormalities of gait and mobility: Secondary | ICD-10-CM | POA: Diagnosis not present

## 2018-01-03 DIAGNOSIS — R2689 Other abnormalities of gait and mobility: Secondary | ICD-10-CM | POA: Diagnosis not present

## 2018-01-03 DIAGNOSIS — M6281 Muscle weakness (generalized): Secondary | ICD-10-CM | POA: Diagnosis not present

## 2018-01-04 DIAGNOSIS — R2689 Other abnormalities of gait and mobility: Secondary | ICD-10-CM | POA: Diagnosis not present

## 2018-01-04 DIAGNOSIS — M6281 Muscle weakness (generalized): Secondary | ICD-10-CM | POA: Diagnosis not present

## 2018-01-07 DIAGNOSIS — M6281 Muscle weakness (generalized): Secondary | ICD-10-CM | POA: Diagnosis not present

## 2018-01-07 DIAGNOSIS — R2689 Other abnormalities of gait and mobility: Secondary | ICD-10-CM | POA: Diagnosis not present

## 2018-01-08 DIAGNOSIS — M6281 Muscle weakness (generalized): Secondary | ICD-10-CM | POA: Diagnosis not present

## 2018-01-08 DIAGNOSIS — R2689 Other abnormalities of gait and mobility: Secondary | ICD-10-CM | POA: Diagnosis not present

## 2018-01-09 DIAGNOSIS — M6281 Muscle weakness (generalized): Secondary | ICD-10-CM | POA: Diagnosis not present

## 2018-01-09 DIAGNOSIS — R2689 Other abnormalities of gait and mobility: Secondary | ICD-10-CM | POA: Diagnosis not present

## 2018-01-10 DIAGNOSIS — M6281 Muscle weakness (generalized): Secondary | ICD-10-CM | POA: Diagnosis not present

## 2018-01-10 DIAGNOSIS — R2689 Other abnormalities of gait and mobility: Secondary | ICD-10-CM | POA: Diagnosis not present

## 2018-01-11 DIAGNOSIS — M6281 Muscle weakness (generalized): Secondary | ICD-10-CM | POA: Diagnosis not present

## 2018-01-11 DIAGNOSIS — R2689 Other abnormalities of gait and mobility: Secondary | ICD-10-CM | POA: Diagnosis not present

## 2018-01-14 DIAGNOSIS — R2689 Other abnormalities of gait and mobility: Secondary | ICD-10-CM | POA: Diagnosis not present

## 2018-01-14 DIAGNOSIS — M6281 Muscle weakness (generalized): Secondary | ICD-10-CM | POA: Diagnosis not present

## 2018-01-14 DIAGNOSIS — Z79899 Other long term (current) drug therapy: Secondary | ICD-10-CM | POA: Diagnosis not present

## 2018-01-14 DIAGNOSIS — Z5181 Encounter for therapeutic drug level monitoring: Secondary | ICD-10-CM | POA: Diagnosis not present

## 2018-01-15 DIAGNOSIS — M6281 Muscle weakness (generalized): Secondary | ICD-10-CM | POA: Diagnosis not present

## 2018-01-15 DIAGNOSIS — R2689 Other abnormalities of gait and mobility: Secondary | ICD-10-CM | POA: Diagnosis not present

## 2018-01-16 DIAGNOSIS — M6281 Muscle weakness (generalized): Secondary | ICD-10-CM | POA: Diagnosis not present

## 2018-01-16 DIAGNOSIS — R2689 Other abnormalities of gait and mobility: Secondary | ICD-10-CM | POA: Diagnosis not present

## 2018-01-17 DIAGNOSIS — R2689 Other abnormalities of gait and mobility: Secondary | ICD-10-CM | POA: Diagnosis not present

## 2018-01-17 DIAGNOSIS — M6281 Muscle weakness (generalized): Secondary | ICD-10-CM | POA: Diagnosis not present

## 2018-01-18 DIAGNOSIS — M6281 Muscle weakness (generalized): Secondary | ICD-10-CM | POA: Diagnosis not present

## 2018-01-18 DIAGNOSIS — R2689 Other abnormalities of gait and mobility: Secondary | ICD-10-CM | POA: Diagnosis not present

## 2018-01-21 DIAGNOSIS — M6281 Muscle weakness (generalized): Secondary | ICD-10-CM | POA: Diagnosis not present

## 2018-01-21 DIAGNOSIS — R2689 Other abnormalities of gait and mobility: Secondary | ICD-10-CM | POA: Diagnosis not present

## 2018-02-19 DIAGNOSIS — I482 Chronic atrial fibrillation: Secondary | ICD-10-CM | POA: Diagnosis not present

## 2018-02-26 DIAGNOSIS — E039 Hypothyroidism, unspecified: Secondary | ICD-10-CM | POA: Diagnosis not present

## 2018-04-16 DIAGNOSIS — I482 Chronic atrial fibrillation: Secondary | ICD-10-CM | POA: Diagnosis not present

## 2018-05-02 DIAGNOSIS — R05 Cough: Secondary | ICD-10-CM | POA: Diagnosis not present

## 2018-05-05 DIAGNOSIS — J158 Pneumonia due to other specified bacteria: Secondary | ICD-10-CM | POA: Diagnosis not present

## 2018-05-05 DIAGNOSIS — I482 Chronic atrial fibrillation: Secondary | ICD-10-CM | POA: Diagnosis not present

## 2018-05-20 DIAGNOSIS — J189 Pneumonia, unspecified organism: Secondary | ICD-10-CM | POA: Diagnosis not present

## 2018-06-11 DIAGNOSIS — I482 Chronic atrial fibrillation: Secondary | ICD-10-CM | POA: Diagnosis not present

## 2018-06-11 DIAGNOSIS — J158 Pneumonia due to other specified bacteria: Secondary | ICD-10-CM | POA: Diagnosis not present

## 2018-06-13 DIAGNOSIS — L603 Nail dystrophy: Secondary | ICD-10-CM | POA: Diagnosis not present

## 2018-06-13 DIAGNOSIS — I739 Peripheral vascular disease, unspecified: Secondary | ICD-10-CM | POA: Diagnosis not present

## 2018-07-16 DIAGNOSIS — R2689 Other abnormalities of gait and mobility: Secondary | ICD-10-CM | POA: Diagnosis not present

## 2018-07-16 DIAGNOSIS — M6281 Muscle weakness (generalized): Secondary | ICD-10-CM | POA: Diagnosis not present

## 2018-07-17 DIAGNOSIS — M6281 Muscle weakness (generalized): Secondary | ICD-10-CM | POA: Diagnosis not present

## 2018-07-17 DIAGNOSIS — R2689 Other abnormalities of gait and mobility: Secondary | ICD-10-CM | POA: Diagnosis not present

## 2018-07-18 DIAGNOSIS — M6281 Muscle weakness (generalized): Secondary | ICD-10-CM | POA: Diagnosis not present

## 2018-07-18 DIAGNOSIS — R2689 Other abnormalities of gait and mobility: Secondary | ICD-10-CM | POA: Diagnosis not present

## 2018-07-19 DIAGNOSIS — R2689 Other abnormalities of gait and mobility: Secondary | ICD-10-CM | POA: Diagnosis not present

## 2018-07-19 DIAGNOSIS — M6281 Muscle weakness (generalized): Secondary | ICD-10-CM | POA: Diagnosis not present

## 2018-07-20 DIAGNOSIS — A419 Sepsis, unspecified organism: Secondary | ICD-10-CM | POA: Diagnosis not present

## 2018-07-20 DIAGNOSIS — N390 Urinary tract infection, site not specified: Secondary | ICD-10-CM | POA: Diagnosis not present

## 2018-07-20 DIAGNOSIS — I69354 Hemiplegia and hemiparesis following cerebral infarction affecting left non-dominant side: Secondary | ICD-10-CM | POA: Diagnosis not present

## 2018-07-20 DIAGNOSIS — I482 Chronic atrial fibrillation, unspecified: Secondary | ICD-10-CM | POA: Diagnosis not present

## 2018-07-20 DIAGNOSIS — R740 Nonspecific elevation of levels of transaminase and lactic acid dehydrogenase [LDH]: Secondary | ICD-10-CM | POA: Diagnosis not present

## 2018-07-20 DIAGNOSIS — K449 Diaphragmatic hernia without obstruction or gangrene: Secondary | ICD-10-CM | POA: Diagnosis not present

## 2018-07-20 DIAGNOSIS — E872 Acidosis: Secondary | ICD-10-CM | POA: Diagnosis not present

## 2018-07-20 DIAGNOSIS — Z66 Do not resuscitate: Secondary | ICD-10-CM | POA: Diagnosis not present

## 2018-07-20 DIAGNOSIS — I700 Atherosclerosis of aorta: Secondary | ICD-10-CM | POA: Diagnosis not present

## 2018-07-20 DIAGNOSIS — I2510 Atherosclerotic heart disease of native coronary artery without angina pectoris: Secondary | ICD-10-CM | POA: Diagnosis not present

## 2018-07-21 DIAGNOSIS — I482 Chronic atrial fibrillation, unspecified: Secondary | ICD-10-CM | POA: Diagnosis not present

## 2018-07-21 DIAGNOSIS — G43909 Migraine, unspecified, not intractable, without status migrainosus: Secondary | ICD-10-CM | POA: Diagnosis present

## 2018-07-21 DIAGNOSIS — E039 Hypothyroidism, unspecified: Secondary | ICD-10-CM | POA: Diagnosis present

## 2018-07-21 DIAGNOSIS — A419 Sepsis, unspecified organism: Secondary | ICD-10-CM | POA: Diagnosis not present

## 2018-07-21 DIAGNOSIS — I700 Atherosclerosis of aorta: Secondary | ICD-10-CM | POA: Diagnosis present

## 2018-07-21 DIAGNOSIS — K449 Diaphragmatic hernia without obstruction or gangrene: Secondary | ICD-10-CM | POA: Diagnosis not present

## 2018-07-21 DIAGNOSIS — E872 Acidosis: Secondary | ICD-10-CM | POA: Diagnosis not present

## 2018-07-21 DIAGNOSIS — Z66 Do not resuscitate: Secondary | ICD-10-CM | POA: Diagnosis present

## 2018-07-21 DIAGNOSIS — E209 Hypoparathyroidism, unspecified: Secondary | ICD-10-CM | POA: Diagnosis present

## 2018-07-21 DIAGNOSIS — F319 Bipolar disorder, unspecified: Secondary | ICD-10-CM | POA: Diagnosis present

## 2018-07-21 DIAGNOSIS — N390 Urinary tract infection, site not specified: Secondary | ICD-10-CM | POA: Diagnosis present

## 2018-07-21 DIAGNOSIS — Z7901 Long term (current) use of anticoagulants: Secondary | ICD-10-CM | POA: Diagnosis not present

## 2018-07-21 DIAGNOSIS — I2510 Atherosclerotic heart disease of native coronary artery without angina pectoris: Secondary | ICD-10-CM | POA: Diagnosis present

## 2018-07-21 DIAGNOSIS — E876 Hypokalemia: Secondary | ICD-10-CM | POA: Diagnosis present

## 2018-07-21 DIAGNOSIS — Z79899 Other long term (current) drug therapy: Secondary | ICD-10-CM | POA: Diagnosis not present

## 2018-07-21 DIAGNOSIS — I69354 Hemiplegia and hemiparesis following cerebral infarction affecting left non-dominant side: Secondary | ICD-10-CM | POA: Diagnosis not present

## 2018-07-21 DIAGNOSIS — I252 Old myocardial infarction: Secondary | ICD-10-CM | POA: Diagnosis not present

## 2018-07-21 DIAGNOSIS — K219 Gastro-esophageal reflux disease without esophagitis: Secondary | ICD-10-CM | POA: Diagnosis present

## 2018-07-25 DIAGNOSIS — M6281 Muscle weakness (generalized): Secondary | ICD-10-CM | POA: Diagnosis not present

## 2018-07-25 DIAGNOSIS — R2689 Other abnormalities of gait and mobility: Secondary | ICD-10-CM | POA: Diagnosis not present

## 2018-07-25 DIAGNOSIS — I69854 Hemiplegia and hemiparesis following other cerebrovascular disease affecting left non-dominant side: Secondary | ICD-10-CM | POA: Diagnosis not present

## 2018-07-27 DIAGNOSIS — R2689 Other abnormalities of gait and mobility: Secondary | ICD-10-CM | POA: Diagnosis not present

## 2018-07-27 DIAGNOSIS — I69854 Hemiplegia and hemiparesis following other cerebrovascular disease affecting left non-dominant side: Secondary | ICD-10-CM | POA: Diagnosis not present

## 2018-07-27 DIAGNOSIS — M6281 Muscle weakness (generalized): Secondary | ICD-10-CM | POA: Diagnosis not present

## 2018-07-28 DIAGNOSIS — I482 Chronic atrial fibrillation, unspecified: Secondary | ICD-10-CM | POA: Diagnosis not present

## 2018-07-28 DIAGNOSIS — A419 Sepsis, unspecified organism: Secondary | ICD-10-CM | POA: Diagnosis not present

## 2018-07-29 DIAGNOSIS — M6281 Muscle weakness (generalized): Secondary | ICD-10-CM | POA: Diagnosis not present

## 2018-07-29 DIAGNOSIS — I69854 Hemiplegia and hemiparesis following other cerebrovascular disease affecting left non-dominant side: Secondary | ICD-10-CM | POA: Diagnosis not present

## 2018-07-29 DIAGNOSIS — R2689 Other abnormalities of gait and mobility: Secondary | ICD-10-CM | POA: Diagnosis not present

## 2018-07-30 DIAGNOSIS — I69854 Hemiplegia and hemiparesis following other cerebrovascular disease affecting left non-dominant side: Secondary | ICD-10-CM | POA: Diagnosis not present

## 2018-07-30 DIAGNOSIS — E876 Hypokalemia: Secondary | ICD-10-CM | POA: Diagnosis not present

## 2018-07-30 DIAGNOSIS — M6281 Muscle weakness (generalized): Secondary | ICD-10-CM | POA: Diagnosis not present

## 2018-07-30 DIAGNOSIS — R2689 Other abnormalities of gait and mobility: Secondary | ICD-10-CM | POA: Diagnosis not present

## 2018-07-31 DIAGNOSIS — M6281 Muscle weakness (generalized): Secondary | ICD-10-CM | POA: Diagnosis not present

## 2018-07-31 DIAGNOSIS — I69854 Hemiplegia and hemiparesis following other cerebrovascular disease affecting left non-dominant side: Secondary | ICD-10-CM | POA: Diagnosis not present

## 2018-07-31 DIAGNOSIS — R2689 Other abnormalities of gait and mobility: Secondary | ICD-10-CM | POA: Diagnosis not present

## 2018-08-01 DIAGNOSIS — M6281 Muscle weakness (generalized): Secondary | ICD-10-CM | POA: Diagnosis not present

## 2018-08-01 DIAGNOSIS — I69854 Hemiplegia and hemiparesis following other cerebrovascular disease affecting left non-dominant side: Secondary | ICD-10-CM | POA: Diagnosis not present

## 2018-08-01 DIAGNOSIS — R2689 Other abnormalities of gait and mobility: Secondary | ICD-10-CM | POA: Diagnosis not present

## 2018-08-02 DIAGNOSIS — I69854 Hemiplegia and hemiparesis following other cerebrovascular disease affecting left non-dominant side: Secondary | ICD-10-CM | POA: Diagnosis not present

## 2018-08-02 DIAGNOSIS — R2689 Other abnormalities of gait and mobility: Secondary | ICD-10-CM | POA: Diagnosis not present

## 2018-08-02 DIAGNOSIS — M6281 Muscle weakness (generalized): Secondary | ICD-10-CM | POA: Diagnosis not present

## 2018-08-05 DIAGNOSIS — I69854 Hemiplegia and hemiparesis following other cerebrovascular disease affecting left non-dominant side: Secondary | ICD-10-CM | POA: Diagnosis not present

## 2018-08-05 DIAGNOSIS — M6281 Muscle weakness (generalized): Secondary | ICD-10-CM | POA: Diagnosis not present

## 2018-08-05 DIAGNOSIS — R2689 Other abnormalities of gait and mobility: Secondary | ICD-10-CM | POA: Diagnosis not present

## 2018-08-06 DIAGNOSIS — R2689 Other abnormalities of gait and mobility: Secondary | ICD-10-CM | POA: Diagnosis not present

## 2018-08-06 DIAGNOSIS — I69854 Hemiplegia and hemiparesis following other cerebrovascular disease affecting left non-dominant side: Secondary | ICD-10-CM | POA: Diagnosis not present

## 2018-08-06 DIAGNOSIS — M6281 Muscle weakness (generalized): Secondary | ICD-10-CM | POA: Diagnosis not present

## 2018-08-06 DIAGNOSIS — I482 Chronic atrial fibrillation, unspecified: Secondary | ICD-10-CM | POA: Diagnosis not present

## 2018-08-06 DIAGNOSIS — A419 Sepsis, unspecified organism: Secondary | ICD-10-CM | POA: Diagnosis not present

## 2018-08-07 DIAGNOSIS — M6281 Muscle weakness (generalized): Secondary | ICD-10-CM | POA: Diagnosis not present

## 2018-08-07 DIAGNOSIS — R2689 Other abnormalities of gait and mobility: Secondary | ICD-10-CM | POA: Diagnosis not present

## 2018-08-07 DIAGNOSIS — I69854 Hemiplegia and hemiparesis following other cerebrovascular disease affecting left non-dominant side: Secondary | ICD-10-CM | POA: Diagnosis not present

## 2018-08-08 DIAGNOSIS — R2689 Other abnormalities of gait and mobility: Secondary | ICD-10-CM | POA: Diagnosis not present

## 2018-08-08 DIAGNOSIS — I69854 Hemiplegia and hemiparesis following other cerebrovascular disease affecting left non-dominant side: Secondary | ICD-10-CM | POA: Diagnosis not present

## 2018-08-08 DIAGNOSIS — M6281 Muscle weakness (generalized): Secondary | ICD-10-CM | POA: Diagnosis not present

## 2018-08-09 DIAGNOSIS — R2681 Unsteadiness on feet: Secondary | ICD-10-CM | POA: Diagnosis not present

## 2018-08-09 DIAGNOSIS — R2689 Other abnormalities of gait and mobility: Secondary | ICD-10-CM | POA: Diagnosis not present

## 2018-08-09 DIAGNOSIS — M6281 Muscle weakness (generalized): Secondary | ICD-10-CM | POA: Diagnosis not present

## 2018-08-09 DIAGNOSIS — I69854 Hemiplegia and hemiparesis following other cerebrovascular disease affecting left non-dominant side: Secondary | ICD-10-CM | POA: Diagnosis not present

## 2018-08-12 DIAGNOSIS — R2689 Other abnormalities of gait and mobility: Secondary | ICD-10-CM | POA: Diagnosis not present

## 2018-08-12 DIAGNOSIS — I69854 Hemiplegia and hemiparesis following other cerebrovascular disease affecting left non-dominant side: Secondary | ICD-10-CM | POA: Diagnosis not present

## 2018-08-12 DIAGNOSIS — R2681 Unsteadiness on feet: Secondary | ICD-10-CM | POA: Diagnosis not present

## 2018-08-12 DIAGNOSIS — M6281 Muscle weakness (generalized): Secondary | ICD-10-CM | POA: Diagnosis not present

## 2018-08-13 DIAGNOSIS — I69854 Hemiplegia and hemiparesis following other cerebrovascular disease affecting left non-dominant side: Secondary | ICD-10-CM | POA: Diagnosis not present

## 2018-08-13 DIAGNOSIS — M6281 Muscle weakness (generalized): Secondary | ICD-10-CM | POA: Diagnosis not present

## 2018-08-13 DIAGNOSIS — R2689 Other abnormalities of gait and mobility: Secondary | ICD-10-CM | POA: Diagnosis not present

## 2018-08-13 DIAGNOSIS — R2681 Unsteadiness on feet: Secondary | ICD-10-CM | POA: Diagnosis not present

## 2018-08-14 DIAGNOSIS — R2689 Other abnormalities of gait and mobility: Secondary | ICD-10-CM | POA: Diagnosis not present

## 2018-08-14 DIAGNOSIS — R2681 Unsteadiness on feet: Secondary | ICD-10-CM | POA: Diagnosis not present

## 2018-08-14 DIAGNOSIS — I69854 Hemiplegia and hemiparesis following other cerebrovascular disease affecting left non-dominant side: Secondary | ICD-10-CM | POA: Diagnosis not present

## 2018-08-14 DIAGNOSIS — M6281 Muscle weakness (generalized): Secondary | ICD-10-CM | POA: Diagnosis not present

## 2018-08-15 DIAGNOSIS — R2689 Other abnormalities of gait and mobility: Secondary | ICD-10-CM | POA: Diagnosis not present

## 2018-08-15 DIAGNOSIS — M6281 Muscle weakness (generalized): Secondary | ICD-10-CM | POA: Diagnosis not present

## 2018-08-15 DIAGNOSIS — I69854 Hemiplegia and hemiparesis following other cerebrovascular disease affecting left non-dominant side: Secondary | ICD-10-CM | POA: Diagnosis not present

## 2018-08-15 DIAGNOSIS — R2681 Unsteadiness on feet: Secondary | ICD-10-CM | POA: Diagnosis not present

## 2018-08-16 DIAGNOSIS — R2681 Unsteadiness on feet: Secondary | ICD-10-CM | POA: Diagnosis not present

## 2018-08-16 DIAGNOSIS — R2689 Other abnormalities of gait and mobility: Secondary | ICD-10-CM | POA: Diagnosis not present

## 2018-08-16 DIAGNOSIS — I69854 Hemiplegia and hemiparesis following other cerebrovascular disease affecting left non-dominant side: Secondary | ICD-10-CM | POA: Diagnosis not present

## 2018-08-16 DIAGNOSIS — M6281 Muscle weakness (generalized): Secondary | ICD-10-CM | POA: Diagnosis not present

## 2018-08-19 DIAGNOSIS — R2681 Unsteadiness on feet: Secondary | ICD-10-CM | POA: Diagnosis not present

## 2018-08-19 DIAGNOSIS — M6281 Muscle weakness (generalized): Secondary | ICD-10-CM | POA: Diagnosis not present

## 2018-08-19 DIAGNOSIS — I69854 Hemiplegia and hemiparesis following other cerebrovascular disease affecting left non-dominant side: Secondary | ICD-10-CM | POA: Diagnosis not present

## 2018-08-19 DIAGNOSIS — R2689 Other abnormalities of gait and mobility: Secondary | ICD-10-CM | POA: Diagnosis not present

## 2018-08-20 DIAGNOSIS — R2681 Unsteadiness on feet: Secondary | ICD-10-CM | POA: Diagnosis not present

## 2018-08-20 DIAGNOSIS — R2689 Other abnormalities of gait and mobility: Secondary | ICD-10-CM | POA: Diagnosis not present

## 2018-08-20 DIAGNOSIS — I69854 Hemiplegia and hemiparesis following other cerebrovascular disease affecting left non-dominant side: Secondary | ICD-10-CM | POA: Diagnosis not present

## 2018-08-20 DIAGNOSIS — M6281 Muscle weakness (generalized): Secondary | ICD-10-CM | POA: Diagnosis not present

## 2018-08-21 DIAGNOSIS — M6281 Muscle weakness (generalized): Secondary | ICD-10-CM | POA: Diagnosis not present

## 2018-08-21 DIAGNOSIS — I69854 Hemiplegia and hemiparesis following other cerebrovascular disease affecting left non-dominant side: Secondary | ICD-10-CM | POA: Diagnosis not present

## 2018-08-21 DIAGNOSIS — R2681 Unsteadiness on feet: Secondary | ICD-10-CM | POA: Diagnosis not present

## 2018-08-21 DIAGNOSIS — R2689 Other abnormalities of gait and mobility: Secondary | ICD-10-CM | POA: Diagnosis not present

## 2018-08-22 DIAGNOSIS — I69854 Hemiplegia and hemiparesis following other cerebrovascular disease affecting left non-dominant side: Secondary | ICD-10-CM | POA: Diagnosis not present

## 2018-08-22 DIAGNOSIS — R2689 Other abnormalities of gait and mobility: Secondary | ICD-10-CM | POA: Diagnosis not present

## 2018-08-22 DIAGNOSIS — R2681 Unsteadiness on feet: Secondary | ICD-10-CM | POA: Diagnosis not present

## 2018-08-22 DIAGNOSIS — M6281 Muscle weakness (generalized): Secondary | ICD-10-CM | POA: Diagnosis not present

## 2018-08-23 DIAGNOSIS — R2689 Other abnormalities of gait and mobility: Secondary | ICD-10-CM | POA: Diagnosis not present

## 2018-08-23 DIAGNOSIS — R2681 Unsteadiness on feet: Secondary | ICD-10-CM | POA: Diagnosis not present

## 2018-08-23 DIAGNOSIS — M6281 Muscle weakness (generalized): Secondary | ICD-10-CM | POA: Diagnosis not present

## 2018-08-23 DIAGNOSIS — I69854 Hemiplegia and hemiparesis following other cerebrovascular disease affecting left non-dominant side: Secondary | ICD-10-CM | POA: Diagnosis not present

## 2018-08-26 DIAGNOSIS — M6281 Muscle weakness (generalized): Secondary | ICD-10-CM | POA: Diagnosis not present

## 2018-08-26 DIAGNOSIS — I69854 Hemiplegia and hemiparesis following other cerebrovascular disease affecting left non-dominant side: Secondary | ICD-10-CM | POA: Diagnosis not present

## 2018-08-26 DIAGNOSIS — R2689 Other abnormalities of gait and mobility: Secondary | ICD-10-CM | POA: Diagnosis not present

## 2018-08-26 DIAGNOSIS — R2681 Unsteadiness on feet: Secondary | ICD-10-CM | POA: Diagnosis not present

## 2018-08-27 DIAGNOSIS — R2689 Other abnormalities of gait and mobility: Secondary | ICD-10-CM | POA: Diagnosis not present

## 2018-08-27 DIAGNOSIS — R2681 Unsteadiness on feet: Secondary | ICD-10-CM | POA: Diagnosis not present

## 2018-08-27 DIAGNOSIS — Z5181 Encounter for therapeutic drug level monitoring: Secondary | ICD-10-CM | POA: Diagnosis not present

## 2018-08-27 DIAGNOSIS — M6281 Muscle weakness (generalized): Secondary | ICD-10-CM | POA: Diagnosis not present

## 2018-08-27 DIAGNOSIS — I69854 Hemiplegia and hemiparesis following other cerebrovascular disease affecting left non-dominant side: Secondary | ICD-10-CM | POA: Diagnosis not present

## 2018-08-29 DIAGNOSIS — R2681 Unsteadiness on feet: Secondary | ICD-10-CM | POA: Diagnosis not present

## 2018-08-29 DIAGNOSIS — I69854 Hemiplegia and hemiparesis following other cerebrovascular disease affecting left non-dominant side: Secondary | ICD-10-CM | POA: Diagnosis not present

## 2018-08-29 DIAGNOSIS — M6281 Muscle weakness (generalized): Secondary | ICD-10-CM | POA: Diagnosis not present

## 2018-08-29 DIAGNOSIS — R2689 Other abnormalities of gait and mobility: Secondary | ICD-10-CM | POA: Diagnosis not present

## 2018-08-30 DIAGNOSIS — R2689 Other abnormalities of gait and mobility: Secondary | ICD-10-CM | POA: Diagnosis not present

## 2018-08-30 DIAGNOSIS — M6281 Muscle weakness (generalized): Secondary | ICD-10-CM | POA: Diagnosis not present

## 2018-08-30 DIAGNOSIS — R2681 Unsteadiness on feet: Secondary | ICD-10-CM | POA: Diagnosis not present

## 2018-08-30 DIAGNOSIS — I69854 Hemiplegia and hemiparesis following other cerebrovascular disease affecting left non-dominant side: Secondary | ICD-10-CM | POA: Diagnosis not present

## 2018-09-02 DIAGNOSIS — R2689 Other abnormalities of gait and mobility: Secondary | ICD-10-CM | POA: Diagnosis not present

## 2018-09-02 DIAGNOSIS — M6281 Muscle weakness (generalized): Secondary | ICD-10-CM | POA: Diagnosis not present

## 2018-09-02 DIAGNOSIS — I69854 Hemiplegia and hemiparesis following other cerebrovascular disease affecting left non-dominant side: Secondary | ICD-10-CM | POA: Diagnosis not present

## 2018-09-02 DIAGNOSIS — R2681 Unsteadiness on feet: Secondary | ICD-10-CM | POA: Diagnosis not present

## 2018-09-03 DIAGNOSIS — R2681 Unsteadiness on feet: Secondary | ICD-10-CM | POA: Diagnosis not present

## 2018-09-03 DIAGNOSIS — R2689 Other abnormalities of gait and mobility: Secondary | ICD-10-CM | POA: Diagnosis not present

## 2018-09-03 DIAGNOSIS — M6281 Muscle weakness (generalized): Secondary | ICD-10-CM | POA: Diagnosis not present

## 2018-09-03 DIAGNOSIS — I69854 Hemiplegia and hemiparesis following other cerebrovascular disease affecting left non-dominant side: Secondary | ICD-10-CM | POA: Diagnosis not present

## 2018-09-04 DIAGNOSIS — I69854 Hemiplegia and hemiparesis following other cerebrovascular disease affecting left non-dominant side: Secondary | ICD-10-CM | POA: Diagnosis not present

## 2018-09-04 DIAGNOSIS — R2681 Unsteadiness on feet: Secondary | ICD-10-CM | POA: Diagnosis not present

## 2018-09-04 DIAGNOSIS — R2689 Other abnormalities of gait and mobility: Secondary | ICD-10-CM | POA: Diagnosis not present

## 2018-09-04 DIAGNOSIS — M6281 Muscle weakness (generalized): Secondary | ICD-10-CM | POA: Diagnosis not present

## 2018-09-05 DIAGNOSIS — R2689 Other abnormalities of gait and mobility: Secondary | ICD-10-CM | POA: Diagnosis not present

## 2018-09-05 DIAGNOSIS — R2681 Unsteadiness on feet: Secondary | ICD-10-CM | POA: Diagnosis not present

## 2018-09-05 DIAGNOSIS — M6281 Muscle weakness (generalized): Secondary | ICD-10-CM | POA: Diagnosis not present

## 2018-09-05 DIAGNOSIS — I69854 Hemiplegia and hemiparesis following other cerebrovascular disease affecting left non-dominant side: Secondary | ICD-10-CM | POA: Diagnosis not present

## 2018-09-06 DIAGNOSIS — I69854 Hemiplegia and hemiparesis following other cerebrovascular disease affecting left non-dominant side: Secondary | ICD-10-CM | POA: Diagnosis not present

## 2018-09-06 DIAGNOSIS — M6281 Muscle weakness (generalized): Secondary | ICD-10-CM | POA: Diagnosis not present

## 2018-09-06 DIAGNOSIS — R2681 Unsteadiness on feet: Secondary | ICD-10-CM | POA: Diagnosis not present

## 2018-09-06 DIAGNOSIS — R2689 Other abnormalities of gait and mobility: Secondary | ICD-10-CM | POA: Diagnosis not present

## 2018-09-09 DIAGNOSIS — M6281 Muscle weakness (generalized): Secondary | ICD-10-CM | POA: Diagnosis not present

## 2018-09-09 DIAGNOSIS — R2689 Other abnormalities of gait and mobility: Secondary | ICD-10-CM | POA: Diagnosis not present

## 2018-09-09 DIAGNOSIS — R1311 Dysphagia, oral phase: Secondary | ICD-10-CM | POA: Diagnosis not present

## 2018-09-09 DIAGNOSIS — I69854 Hemiplegia and hemiparesis following other cerebrovascular disease affecting left non-dominant side: Secondary | ICD-10-CM | POA: Diagnosis not present

## 2018-09-09 DIAGNOSIS — R2681 Unsteadiness on feet: Secondary | ICD-10-CM | POA: Diagnosis not present

## 2018-09-10 DIAGNOSIS — M6281 Muscle weakness (generalized): Secondary | ICD-10-CM | POA: Diagnosis not present

## 2018-09-10 DIAGNOSIS — I69854 Hemiplegia and hemiparesis following other cerebrovascular disease affecting left non-dominant side: Secondary | ICD-10-CM | POA: Diagnosis not present

## 2018-09-10 DIAGNOSIS — R1311 Dysphagia, oral phase: Secondary | ICD-10-CM | POA: Diagnosis not present

## 2018-09-10 DIAGNOSIS — R2681 Unsteadiness on feet: Secondary | ICD-10-CM | POA: Diagnosis not present

## 2018-09-10 DIAGNOSIS — R2689 Other abnormalities of gait and mobility: Secondary | ICD-10-CM | POA: Diagnosis not present

## 2018-09-11 DIAGNOSIS — R2681 Unsteadiness on feet: Secondary | ICD-10-CM | POA: Diagnosis not present

## 2018-09-11 DIAGNOSIS — I69854 Hemiplegia and hemiparesis following other cerebrovascular disease affecting left non-dominant side: Secondary | ICD-10-CM | POA: Diagnosis not present

## 2018-09-11 DIAGNOSIS — R1311 Dysphagia, oral phase: Secondary | ICD-10-CM | POA: Diagnosis not present

## 2018-09-11 DIAGNOSIS — M6281 Muscle weakness (generalized): Secondary | ICD-10-CM | POA: Diagnosis not present

## 2018-09-11 DIAGNOSIS — R2689 Other abnormalities of gait and mobility: Secondary | ICD-10-CM | POA: Diagnosis not present

## 2018-09-12 DIAGNOSIS — R2681 Unsteadiness on feet: Secondary | ICD-10-CM | POA: Diagnosis not present

## 2018-09-12 DIAGNOSIS — M6281 Muscle weakness (generalized): Secondary | ICD-10-CM | POA: Diagnosis not present

## 2018-09-12 DIAGNOSIS — R2689 Other abnormalities of gait and mobility: Secondary | ICD-10-CM | POA: Diagnosis not present

## 2018-09-12 DIAGNOSIS — R1311 Dysphagia, oral phase: Secondary | ICD-10-CM | POA: Diagnosis not present

## 2018-09-12 DIAGNOSIS — I69854 Hemiplegia and hemiparesis following other cerebrovascular disease affecting left non-dominant side: Secondary | ICD-10-CM | POA: Diagnosis not present

## 2018-09-13 DIAGNOSIS — R2681 Unsteadiness on feet: Secondary | ICD-10-CM | POA: Diagnosis not present

## 2018-09-13 DIAGNOSIS — M6281 Muscle weakness (generalized): Secondary | ICD-10-CM | POA: Diagnosis not present

## 2018-09-13 DIAGNOSIS — I69854 Hemiplegia and hemiparesis following other cerebrovascular disease affecting left non-dominant side: Secondary | ICD-10-CM | POA: Diagnosis not present

## 2018-09-13 DIAGNOSIS — R2689 Other abnormalities of gait and mobility: Secondary | ICD-10-CM | POA: Diagnosis not present

## 2018-09-13 DIAGNOSIS — R1311 Dysphagia, oral phase: Secondary | ICD-10-CM | POA: Diagnosis not present

## 2018-09-16 DIAGNOSIS — I69854 Hemiplegia and hemiparesis following other cerebrovascular disease affecting left non-dominant side: Secondary | ICD-10-CM | POA: Diagnosis not present

## 2018-09-16 DIAGNOSIS — M6281 Muscle weakness (generalized): Secondary | ICD-10-CM | POA: Diagnosis not present

## 2018-09-16 DIAGNOSIS — R1311 Dysphagia, oral phase: Secondary | ICD-10-CM | POA: Diagnosis not present

## 2018-09-16 DIAGNOSIS — R2689 Other abnormalities of gait and mobility: Secondary | ICD-10-CM | POA: Diagnosis not present

## 2018-09-16 DIAGNOSIS — R2681 Unsteadiness on feet: Secondary | ICD-10-CM | POA: Diagnosis not present

## 2018-09-17 DIAGNOSIS — I69854 Hemiplegia and hemiparesis following other cerebrovascular disease affecting left non-dominant side: Secondary | ICD-10-CM | POA: Diagnosis not present

## 2018-09-17 DIAGNOSIS — R1311 Dysphagia, oral phase: Secondary | ICD-10-CM | POA: Diagnosis not present

## 2018-09-17 DIAGNOSIS — M6281 Muscle weakness (generalized): Secondary | ICD-10-CM | POA: Diagnosis not present

## 2018-09-17 DIAGNOSIS — R2681 Unsteadiness on feet: Secondary | ICD-10-CM | POA: Diagnosis not present

## 2018-09-17 DIAGNOSIS — R2689 Other abnormalities of gait and mobility: Secondary | ICD-10-CM | POA: Diagnosis not present

## 2018-09-18 DIAGNOSIS — R2689 Other abnormalities of gait and mobility: Secondary | ICD-10-CM | POA: Diagnosis not present

## 2018-09-18 DIAGNOSIS — M6281 Muscle weakness (generalized): Secondary | ICD-10-CM | POA: Diagnosis not present

## 2018-09-18 DIAGNOSIS — R1311 Dysphagia, oral phase: Secondary | ICD-10-CM | POA: Diagnosis not present

## 2018-09-18 DIAGNOSIS — R2681 Unsteadiness on feet: Secondary | ICD-10-CM | POA: Diagnosis not present

## 2018-09-18 DIAGNOSIS — I69854 Hemiplegia and hemiparesis following other cerebrovascular disease affecting left non-dominant side: Secondary | ICD-10-CM | POA: Diagnosis not present

## 2018-09-19 DIAGNOSIS — R2689 Other abnormalities of gait and mobility: Secondary | ICD-10-CM | POA: Diagnosis not present

## 2018-09-19 DIAGNOSIS — M6281 Muscle weakness (generalized): Secondary | ICD-10-CM | POA: Diagnosis not present

## 2018-09-19 DIAGNOSIS — R1311 Dysphagia, oral phase: Secondary | ICD-10-CM | POA: Diagnosis not present

## 2018-09-19 DIAGNOSIS — I69854 Hemiplegia and hemiparesis following other cerebrovascular disease affecting left non-dominant side: Secondary | ICD-10-CM | POA: Diagnosis not present

## 2018-09-19 DIAGNOSIS — R2681 Unsteadiness on feet: Secondary | ICD-10-CM | POA: Diagnosis not present

## 2018-09-20 DIAGNOSIS — R2689 Other abnormalities of gait and mobility: Secondary | ICD-10-CM | POA: Diagnosis not present

## 2018-09-20 DIAGNOSIS — R1311 Dysphagia, oral phase: Secondary | ICD-10-CM | POA: Diagnosis not present

## 2018-09-20 DIAGNOSIS — M6281 Muscle weakness (generalized): Secondary | ICD-10-CM | POA: Diagnosis not present

## 2018-09-20 DIAGNOSIS — R2681 Unsteadiness on feet: Secondary | ICD-10-CM | POA: Diagnosis not present

## 2018-09-20 DIAGNOSIS — I69854 Hemiplegia and hemiparesis following other cerebrovascular disease affecting left non-dominant side: Secondary | ICD-10-CM | POA: Diagnosis not present

## 2018-09-23 DIAGNOSIS — R2689 Other abnormalities of gait and mobility: Secondary | ICD-10-CM | POA: Diagnosis not present

## 2018-09-23 DIAGNOSIS — M6281 Muscle weakness (generalized): Secondary | ICD-10-CM | POA: Diagnosis not present

## 2018-09-23 DIAGNOSIS — I69854 Hemiplegia and hemiparesis following other cerebrovascular disease affecting left non-dominant side: Secondary | ICD-10-CM | POA: Diagnosis not present

## 2018-09-23 DIAGNOSIS — R1311 Dysphagia, oral phase: Secondary | ICD-10-CM | POA: Diagnosis not present

## 2018-09-23 DIAGNOSIS — R2681 Unsteadiness on feet: Secondary | ICD-10-CM | POA: Diagnosis not present

## 2018-09-24 DIAGNOSIS — R2689 Other abnormalities of gait and mobility: Secondary | ICD-10-CM | POA: Diagnosis not present

## 2018-09-24 DIAGNOSIS — M6281 Muscle weakness (generalized): Secondary | ICD-10-CM | POA: Diagnosis not present

## 2018-09-24 DIAGNOSIS — R2681 Unsteadiness on feet: Secondary | ICD-10-CM | POA: Diagnosis not present

## 2018-09-24 DIAGNOSIS — R1311 Dysphagia, oral phase: Secondary | ICD-10-CM | POA: Diagnosis not present

## 2018-09-24 DIAGNOSIS — I69854 Hemiplegia and hemiparesis following other cerebrovascular disease affecting left non-dominant side: Secondary | ICD-10-CM | POA: Diagnosis not present

## 2018-09-25 DIAGNOSIS — R2681 Unsteadiness on feet: Secondary | ICD-10-CM | POA: Diagnosis not present

## 2018-09-25 DIAGNOSIS — R2689 Other abnormalities of gait and mobility: Secondary | ICD-10-CM | POA: Diagnosis not present

## 2018-09-25 DIAGNOSIS — R1311 Dysphagia, oral phase: Secondary | ICD-10-CM | POA: Diagnosis not present

## 2018-09-25 DIAGNOSIS — M6281 Muscle weakness (generalized): Secondary | ICD-10-CM | POA: Diagnosis not present

## 2018-09-25 DIAGNOSIS — I69854 Hemiplegia and hemiparesis following other cerebrovascular disease affecting left non-dominant side: Secondary | ICD-10-CM | POA: Diagnosis not present

## 2018-09-26 DIAGNOSIS — R2689 Other abnormalities of gait and mobility: Secondary | ICD-10-CM | POA: Diagnosis not present

## 2018-09-26 DIAGNOSIS — R2681 Unsteadiness on feet: Secondary | ICD-10-CM | POA: Diagnosis not present

## 2018-09-26 DIAGNOSIS — R1311 Dysphagia, oral phase: Secondary | ICD-10-CM | POA: Diagnosis not present

## 2018-09-26 DIAGNOSIS — I69854 Hemiplegia and hemiparesis following other cerebrovascular disease affecting left non-dominant side: Secondary | ICD-10-CM | POA: Diagnosis not present

## 2018-09-26 DIAGNOSIS — M6281 Muscle weakness (generalized): Secondary | ICD-10-CM | POA: Diagnosis not present

## 2018-09-27 DIAGNOSIS — I69854 Hemiplegia and hemiparesis following other cerebrovascular disease affecting left non-dominant side: Secondary | ICD-10-CM | POA: Diagnosis not present

## 2018-09-27 DIAGNOSIS — R2681 Unsteadiness on feet: Secondary | ICD-10-CM | POA: Diagnosis not present

## 2018-09-27 DIAGNOSIS — R1311 Dysphagia, oral phase: Secondary | ICD-10-CM | POA: Diagnosis not present

## 2018-09-27 DIAGNOSIS — R2689 Other abnormalities of gait and mobility: Secondary | ICD-10-CM | POA: Diagnosis not present

## 2018-09-27 DIAGNOSIS — M6281 Muscle weakness (generalized): Secondary | ICD-10-CM | POA: Diagnosis not present

## 2018-09-30 DIAGNOSIS — I69854 Hemiplegia and hemiparesis following other cerebrovascular disease affecting left non-dominant side: Secondary | ICD-10-CM | POA: Diagnosis not present

## 2018-09-30 DIAGNOSIS — R2689 Other abnormalities of gait and mobility: Secondary | ICD-10-CM | POA: Diagnosis not present

## 2018-09-30 DIAGNOSIS — R1311 Dysphagia, oral phase: Secondary | ICD-10-CM | POA: Diagnosis not present

## 2018-09-30 DIAGNOSIS — R2681 Unsteadiness on feet: Secondary | ICD-10-CM | POA: Diagnosis not present

## 2018-09-30 DIAGNOSIS — M6281 Muscle weakness (generalized): Secondary | ICD-10-CM | POA: Diagnosis not present

## 2018-10-01 DIAGNOSIS — R2681 Unsteadiness on feet: Secondary | ICD-10-CM | POA: Diagnosis not present

## 2018-10-01 DIAGNOSIS — R2689 Other abnormalities of gait and mobility: Secondary | ICD-10-CM | POA: Diagnosis not present

## 2018-10-01 DIAGNOSIS — I69854 Hemiplegia and hemiparesis following other cerebrovascular disease affecting left non-dominant side: Secondary | ICD-10-CM | POA: Diagnosis not present

## 2018-10-01 DIAGNOSIS — R1311 Dysphagia, oral phase: Secondary | ICD-10-CM | POA: Diagnosis not present

## 2018-10-01 DIAGNOSIS — M6281 Muscle weakness (generalized): Secondary | ICD-10-CM | POA: Diagnosis not present

## 2018-10-02 DIAGNOSIS — M6281 Muscle weakness (generalized): Secondary | ICD-10-CM | POA: Diagnosis not present

## 2018-10-02 DIAGNOSIS — R2689 Other abnormalities of gait and mobility: Secondary | ICD-10-CM | POA: Diagnosis not present

## 2018-10-02 DIAGNOSIS — R2681 Unsteadiness on feet: Secondary | ICD-10-CM | POA: Diagnosis not present

## 2018-10-02 DIAGNOSIS — I69854 Hemiplegia and hemiparesis following other cerebrovascular disease affecting left non-dominant side: Secondary | ICD-10-CM | POA: Diagnosis not present

## 2018-10-02 DIAGNOSIS — R1311 Dysphagia, oral phase: Secondary | ICD-10-CM | POA: Diagnosis not present

## 2018-10-03 DIAGNOSIS — R1311 Dysphagia, oral phase: Secondary | ICD-10-CM | POA: Diagnosis not present

## 2018-10-03 DIAGNOSIS — I69854 Hemiplegia and hemiparesis following other cerebrovascular disease affecting left non-dominant side: Secondary | ICD-10-CM | POA: Diagnosis not present

## 2018-10-03 DIAGNOSIS — M6281 Muscle weakness (generalized): Secondary | ICD-10-CM | POA: Diagnosis not present

## 2018-10-03 DIAGNOSIS — R2681 Unsteadiness on feet: Secondary | ICD-10-CM | POA: Diagnosis not present

## 2018-10-03 DIAGNOSIS — R2689 Other abnormalities of gait and mobility: Secondary | ICD-10-CM | POA: Diagnosis not present

## 2018-10-04 DIAGNOSIS — R2689 Other abnormalities of gait and mobility: Secondary | ICD-10-CM | POA: Diagnosis not present

## 2018-10-04 DIAGNOSIS — I482 Chronic atrial fibrillation, unspecified: Secondary | ICD-10-CM | POA: Diagnosis not present

## 2018-10-04 DIAGNOSIS — R1311 Dysphagia, oral phase: Secondary | ICD-10-CM | POA: Diagnosis not present

## 2018-10-04 DIAGNOSIS — R2681 Unsteadiness on feet: Secondary | ICD-10-CM | POA: Diagnosis not present

## 2018-10-04 DIAGNOSIS — I69854 Hemiplegia and hemiparesis following other cerebrovascular disease affecting left non-dominant side: Secondary | ICD-10-CM | POA: Diagnosis not present

## 2018-10-04 DIAGNOSIS — M6281 Muscle weakness (generalized): Secondary | ICD-10-CM | POA: Diagnosis not present

## 2018-10-04 DIAGNOSIS — A419 Sepsis, unspecified organism: Secondary | ICD-10-CM | POA: Diagnosis not present

## 2018-10-06 DIAGNOSIS — M6281 Muscle weakness (generalized): Secondary | ICD-10-CM | POA: Diagnosis not present

## 2018-10-06 DIAGNOSIS — R2681 Unsteadiness on feet: Secondary | ICD-10-CM | POA: Diagnosis not present

## 2018-10-06 DIAGNOSIS — R1311 Dysphagia, oral phase: Secondary | ICD-10-CM | POA: Diagnosis not present

## 2018-10-06 DIAGNOSIS — R2689 Other abnormalities of gait and mobility: Secondary | ICD-10-CM | POA: Diagnosis not present

## 2018-10-06 DIAGNOSIS — I69854 Hemiplegia and hemiparesis following other cerebrovascular disease affecting left non-dominant side: Secondary | ICD-10-CM | POA: Diagnosis not present

## 2018-10-07 DIAGNOSIS — M6281 Muscle weakness (generalized): Secondary | ICD-10-CM | POA: Diagnosis not present

## 2018-10-07 DIAGNOSIS — R2681 Unsteadiness on feet: Secondary | ICD-10-CM | POA: Diagnosis not present

## 2018-10-07 DIAGNOSIS — I69854 Hemiplegia and hemiparesis following other cerebrovascular disease affecting left non-dominant side: Secondary | ICD-10-CM | POA: Diagnosis not present

## 2018-10-07 DIAGNOSIS — R1311 Dysphagia, oral phase: Secondary | ICD-10-CM | POA: Diagnosis not present

## 2018-10-07 DIAGNOSIS — R2689 Other abnormalities of gait and mobility: Secondary | ICD-10-CM | POA: Diagnosis not present

## 2018-10-08 DIAGNOSIS — R2689 Other abnormalities of gait and mobility: Secondary | ICD-10-CM | POA: Diagnosis not present

## 2018-10-08 DIAGNOSIS — M6281 Muscle weakness (generalized): Secondary | ICD-10-CM | POA: Diagnosis not present

## 2018-10-08 DIAGNOSIS — I69854 Hemiplegia and hemiparesis following other cerebrovascular disease affecting left non-dominant side: Secondary | ICD-10-CM | POA: Diagnosis not present

## 2018-10-08 DIAGNOSIS — R1311 Dysphagia, oral phase: Secondary | ICD-10-CM | POA: Diagnosis not present

## 2018-10-08 DIAGNOSIS — R2681 Unsteadiness on feet: Secondary | ICD-10-CM | POA: Diagnosis not present

## 2020-09-23 DIAGNOSIS — F329 Major depressive disorder, single episode, unspecified: Secondary | ICD-10-CM | POA: Diagnosis not present

## 2020-09-30 DIAGNOSIS — F329 Major depressive disorder, single episode, unspecified: Secondary | ICD-10-CM | POA: Diagnosis not present

## 2020-10-14 DIAGNOSIS — F329 Major depressive disorder, single episode, unspecified: Secondary | ICD-10-CM | POA: Diagnosis not present

## 2020-10-21 DIAGNOSIS — F329 Major depressive disorder, single episode, unspecified: Secondary | ICD-10-CM | POA: Diagnosis not present

## 2020-10-28 DIAGNOSIS — G4700 Insomnia, unspecified: Secondary | ICD-10-CM | POA: Diagnosis not present

## 2020-10-28 DIAGNOSIS — F329 Major depressive disorder, single episode, unspecified: Secondary | ICD-10-CM | POA: Diagnosis not present

## 2020-10-28 DIAGNOSIS — F319 Bipolar disorder, unspecified: Secondary | ICD-10-CM | POA: Diagnosis not present

## 2020-11-04 DIAGNOSIS — F329 Major depressive disorder, single episode, unspecified: Secondary | ICD-10-CM | POA: Diagnosis not present

## 2020-11-11 DIAGNOSIS — F329 Major depressive disorder, single episode, unspecified: Secondary | ICD-10-CM | POA: Diagnosis not present

## 2020-11-17 DIAGNOSIS — F329 Major depressive disorder, single episode, unspecified: Secondary | ICD-10-CM | POA: Diagnosis not present

## 2020-11-17 DIAGNOSIS — G4700 Insomnia, unspecified: Secondary | ICD-10-CM | POA: Diagnosis not present

## 2020-11-25 DIAGNOSIS — G4700 Insomnia, unspecified: Secondary | ICD-10-CM | POA: Diagnosis not present

## 2020-11-25 DIAGNOSIS — F329 Major depressive disorder, single episode, unspecified: Secondary | ICD-10-CM | POA: Diagnosis not present

## 2020-11-25 DIAGNOSIS — F319 Bipolar disorder, unspecified: Secondary | ICD-10-CM | POA: Diagnosis not present

## 2020-12-02 DIAGNOSIS — F329 Major depressive disorder, single episode, unspecified: Secondary | ICD-10-CM | POA: Diagnosis not present

## 2020-12-04 DIAGNOSIS — I482 Chronic atrial fibrillation, unspecified: Secondary | ICD-10-CM | POA: Diagnosis not present

## 2020-12-04 DIAGNOSIS — G8114 Spastic hemiplegia affecting left nondominant side: Secondary | ICD-10-CM | POA: Diagnosis not present

## 2020-12-09 DIAGNOSIS — F329 Major depressive disorder, single episode, unspecified: Secondary | ICD-10-CM | POA: Diagnosis not present

## 2020-12-16 DIAGNOSIS — F329 Major depressive disorder, single episode, unspecified: Secondary | ICD-10-CM | POA: Diagnosis not present

## 2020-12-16 DIAGNOSIS — G4700 Insomnia, unspecified: Secondary | ICD-10-CM | POA: Diagnosis not present

## 2020-12-23 DIAGNOSIS — F319 Bipolar disorder, unspecified: Secondary | ICD-10-CM | POA: Diagnosis not present

## 2020-12-23 DIAGNOSIS — F329 Major depressive disorder, single episode, unspecified: Secondary | ICD-10-CM | POA: Diagnosis not present

## 2020-12-23 DIAGNOSIS — G4700 Insomnia, unspecified: Secondary | ICD-10-CM | POA: Diagnosis not present

## 2020-12-30 DIAGNOSIS — F329 Major depressive disorder, single episode, unspecified: Secondary | ICD-10-CM | POA: Diagnosis not present

## 2021-01-06 DIAGNOSIS — F329 Major depressive disorder, single episode, unspecified: Secondary | ICD-10-CM | POA: Diagnosis not present

## 2021-01-11 DIAGNOSIS — I5032 Chronic diastolic (congestive) heart failure: Secondary | ICD-10-CM | POA: Diagnosis not present

## 2021-01-11 DIAGNOSIS — E612 Magnesium deficiency: Secondary | ICD-10-CM | POA: Diagnosis not present

## 2021-01-17 DIAGNOSIS — F329 Major depressive disorder, single episode, unspecified: Secondary | ICD-10-CM | POA: Diagnosis not present

## 2021-01-19 DIAGNOSIS — I5032 Chronic diastolic (congestive) heart failure: Secondary | ICD-10-CM | POA: Diagnosis not present

## 2021-01-31 DIAGNOSIS — F329 Major depressive disorder, single episode, unspecified: Secondary | ICD-10-CM | POA: Diagnosis not present

## 2021-02-14 DIAGNOSIS — F329 Major depressive disorder, single episode, unspecified: Secondary | ICD-10-CM | POA: Diagnosis not present

## 2021-02-17 DIAGNOSIS — F319 Bipolar disorder, unspecified: Secondary | ICD-10-CM | POA: Diagnosis not present

## 2021-02-17 DIAGNOSIS — F329 Major depressive disorder, single episode, unspecified: Secondary | ICD-10-CM | POA: Diagnosis not present

## 2021-02-17 DIAGNOSIS — F5101 Primary insomnia: Secondary | ICD-10-CM | POA: Diagnosis not present

## 2021-02-19 DIAGNOSIS — G8114 Spastic hemiplegia affecting left nondominant side: Secondary | ICD-10-CM | POA: Diagnosis not present

## 2021-02-19 DIAGNOSIS — I482 Chronic atrial fibrillation, unspecified: Secondary | ICD-10-CM | POA: Diagnosis not present

## 2021-02-28 DIAGNOSIS — F319 Bipolar disorder, unspecified: Secondary | ICD-10-CM | POA: Diagnosis not present

## 2021-02-28 DIAGNOSIS — F418 Other specified anxiety disorders: Secondary | ICD-10-CM | POA: Diagnosis not present

## 2021-03-18 DIAGNOSIS — F319 Bipolar disorder, unspecified: Secondary | ICD-10-CM | POA: Diagnosis not present

## 2021-03-24 DIAGNOSIS — F338 Other recurrent depressive disorders: Secondary | ICD-10-CM | POA: Diagnosis not present

## 2021-03-24 DIAGNOSIS — F413 Other mixed anxiety disorders: Secondary | ICD-10-CM | POA: Diagnosis not present

## 2021-03-24 DIAGNOSIS — F3189 Other bipolar disorder: Secondary | ICD-10-CM | POA: Diagnosis not present

## 2021-03-24 DIAGNOSIS — F5101 Primary insomnia: Secondary | ICD-10-CM | POA: Diagnosis not present

## 2021-04-11 DIAGNOSIS — I482 Chronic atrial fibrillation, unspecified: Secondary | ICD-10-CM | POA: Diagnosis not present

## 2021-04-11 DIAGNOSIS — G8114 Spastic hemiplegia affecting left nondominant side: Secondary | ICD-10-CM | POA: Diagnosis not present

## 2021-04-19 DIAGNOSIS — R2689 Other abnormalities of gait and mobility: Secondary | ICD-10-CM | POA: Diagnosis not present

## 2021-04-19 DIAGNOSIS — M6281 Muscle weakness (generalized): Secondary | ICD-10-CM | POA: Diagnosis not present

## 2021-04-20 DIAGNOSIS — R2689 Other abnormalities of gait and mobility: Secondary | ICD-10-CM | POA: Diagnosis not present

## 2021-04-20 DIAGNOSIS — M6281 Muscle weakness (generalized): Secondary | ICD-10-CM | POA: Diagnosis not present

## 2021-04-21 DIAGNOSIS — R2689 Other abnormalities of gait and mobility: Secondary | ICD-10-CM | POA: Diagnosis not present

## 2021-04-21 DIAGNOSIS — M6281 Muscle weakness (generalized): Secondary | ICD-10-CM | POA: Diagnosis not present

## 2021-04-22 DIAGNOSIS — F338 Other recurrent depressive disorders: Secondary | ICD-10-CM | POA: Diagnosis not present

## 2021-04-25 DIAGNOSIS — R2689 Other abnormalities of gait and mobility: Secondary | ICD-10-CM | POA: Diagnosis not present

## 2021-04-25 DIAGNOSIS — M6281 Muscle weakness (generalized): Secondary | ICD-10-CM | POA: Diagnosis not present

## 2021-04-26 DIAGNOSIS — M6281 Muscle weakness (generalized): Secondary | ICD-10-CM | POA: Diagnosis not present

## 2021-04-26 DIAGNOSIS — R2689 Other abnormalities of gait and mobility: Secondary | ICD-10-CM | POA: Diagnosis not present

## 2021-04-27 DIAGNOSIS — Z23 Encounter for immunization: Secondary | ICD-10-CM | POA: Diagnosis not present

## 2021-04-30 DIAGNOSIS — M6281 Muscle weakness (generalized): Secondary | ICD-10-CM | POA: Diagnosis not present

## 2021-04-30 DIAGNOSIS — R2689 Other abnormalities of gait and mobility: Secondary | ICD-10-CM | POA: Diagnosis not present

## 2021-05-03 DIAGNOSIS — M6281 Muscle weakness (generalized): Secondary | ICD-10-CM | POA: Diagnosis not present

## 2021-05-03 DIAGNOSIS — R2689 Other abnormalities of gait and mobility: Secondary | ICD-10-CM | POA: Diagnosis not present

## 2021-05-04 DIAGNOSIS — F5101 Primary insomnia: Secondary | ICD-10-CM | POA: Diagnosis not present

## 2021-05-04 DIAGNOSIS — M6281 Muscle weakness (generalized): Secondary | ICD-10-CM | POA: Diagnosis not present

## 2021-05-04 DIAGNOSIS — F413 Other mixed anxiety disorders: Secondary | ICD-10-CM | POA: Diagnosis not present

## 2021-05-04 DIAGNOSIS — R2689 Other abnormalities of gait and mobility: Secondary | ICD-10-CM | POA: Diagnosis not present

## 2021-05-05 DIAGNOSIS — R2689 Other abnormalities of gait and mobility: Secondary | ICD-10-CM | POA: Diagnosis not present

## 2021-05-05 DIAGNOSIS — M6281 Muscle weakness (generalized): Secondary | ICD-10-CM | POA: Diagnosis not present

## 2021-05-06 DIAGNOSIS — M6281 Muscle weakness (generalized): Secondary | ICD-10-CM | POA: Diagnosis not present

## 2021-05-06 DIAGNOSIS — R2689 Other abnormalities of gait and mobility: Secondary | ICD-10-CM | POA: Diagnosis not present

## 2021-05-11 DIAGNOSIS — H04223 Epiphora due to insufficient drainage, bilateral lacrimal glands: Secondary | ICD-10-CM | POA: Diagnosis not present

## 2021-05-11 DIAGNOSIS — H524 Presbyopia: Secondary | ICD-10-CM | POA: Diagnosis not present

## 2021-05-11 DIAGNOSIS — H25813 Combined forms of age-related cataract, bilateral: Secondary | ICD-10-CM | POA: Diagnosis not present

## 2021-05-13 DIAGNOSIS — R2689 Other abnormalities of gait and mobility: Secondary | ICD-10-CM | POA: Diagnosis not present

## 2021-05-13 DIAGNOSIS — M6281 Muscle weakness (generalized): Secondary | ICD-10-CM | POA: Diagnosis not present

## 2021-05-18 DIAGNOSIS — R2689 Other abnormalities of gait and mobility: Secondary | ICD-10-CM | POA: Diagnosis not present

## 2021-05-18 DIAGNOSIS — M6281 Muscle weakness (generalized): Secondary | ICD-10-CM | POA: Diagnosis not present

## 2021-05-20 DIAGNOSIS — F418 Other specified anxiety disorders: Secondary | ICD-10-CM | POA: Diagnosis not present

## 2021-05-23 DIAGNOSIS — R2689 Other abnormalities of gait and mobility: Secondary | ICD-10-CM | POA: Diagnosis not present

## 2021-05-23 DIAGNOSIS — M6281 Muscle weakness (generalized): Secondary | ICD-10-CM | POA: Diagnosis not present

## 2021-05-26 DIAGNOSIS — R2689 Other abnormalities of gait and mobility: Secondary | ICD-10-CM | POA: Diagnosis not present

## 2021-05-26 DIAGNOSIS — M6281 Muscle weakness (generalized): Secondary | ICD-10-CM | POA: Diagnosis not present

## 2021-05-31 DIAGNOSIS — R2689 Other abnormalities of gait and mobility: Secondary | ICD-10-CM | POA: Diagnosis not present

## 2021-05-31 DIAGNOSIS — M6281 Muscle weakness (generalized): Secondary | ICD-10-CM | POA: Diagnosis not present

## 2021-06-01 DIAGNOSIS — M6281 Muscle weakness (generalized): Secondary | ICD-10-CM | POA: Diagnosis not present

## 2021-06-01 DIAGNOSIS — R2689 Other abnormalities of gait and mobility: Secondary | ICD-10-CM | POA: Diagnosis not present

## 2021-06-02 DIAGNOSIS — F413 Other mixed anxiety disorders: Secondary | ICD-10-CM | POA: Diagnosis not present

## 2021-06-02 DIAGNOSIS — F3189 Other bipolar disorder: Secondary | ICD-10-CM | POA: Diagnosis not present

## 2021-06-02 DIAGNOSIS — F338 Other recurrent depressive disorders: Secondary | ICD-10-CM | POA: Diagnosis not present

## 2021-06-02 DIAGNOSIS — M6281 Muscle weakness (generalized): Secondary | ICD-10-CM | POA: Diagnosis not present

## 2021-06-02 DIAGNOSIS — R2689 Other abnormalities of gait and mobility: Secondary | ICD-10-CM | POA: Diagnosis not present

## 2021-06-02 DIAGNOSIS — F5101 Primary insomnia: Secondary | ICD-10-CM | POA: Diagnosis not present

## 2021-06-03 DIAGNOSIS — R2689 Other abnormalities of gait and mobility: Secondary | ICD-10-CM | POA: Diagnosis not present

## 2021-06-03 DIAGNOSIS — M6281 Muscle weakness (generalized): Secondary | ICD-10-CM | POA: Diagnosis not present

## 2021-06-04 DIAGNOSIS — R2689 Other abnormalities of gait and mobility: Secondary | ICD-10-CM | POA: Diagnosis not present

## 2021-06-04 DIAGNOSIS — M6281 Muscle weakness (generalized): Secondary | ICD-10-CM | POA: Diagnosis not present

## 2021-06-06 DIAGNOSIS — F338 Other recurrent depressive disorders: Secondary | ICD-10-CM | POA: Diagnosis not present

## 2021-06-06 DIAGNOSIS — R2689 Other abnormalities of gait and mobility: Secondary | ICD-10-CM | POA: Diagnosis not present

## 2021-06-06 DIAGNOSIS — F418 Other specified anxiety disorders: Secondary | ICD-10-CM | POA: Diagnosis not present

## 2021-06-06 DIAGNOSIS — M6281 Muscle weakness (generalized): Secondary | ICD-10-CM | POA: Diagnosis not present

## 2021-06-07 DIAGNOSIS — L602 Onychogryphosis: Secondary | ICD-10-CM | POA: Diagnosis not present

## 2021-06-07 DIAGNOSIS — R2689 Other abnormalities of gait and mobility: Secondary | ICD-10-CM | POA: Diagnosis not present

## 2021-06-07 DIAGNOSIS — M6281 Muscle weakness (generalized): Secondary | ICD-10-CM | POA: Diagnosis not present

## 2021-06-07 DIAGNOSIS — I739 Peripheral vascular disease, unspecified: Secondary | ICD-10-CM | POA: Diagnosis not present

## 2021-06-08 DIAGNOSIS — R2689 Other abnormalities of gait and mobility: Secondary | ICD-10-CM | POA: Diagnosis not present

## 2021-06-08 DIAGNOSIS — M6281 Muscle weakness (generalized): Secondary | ICD-10-CM | POA: Diagnosis not present

## 2021-06-09 DIAGNOSIS — R2689 Other abnormalities of gait and mobility: Secondary | ICD-10-CM | POA: Diagnosis not present

## 2021-06-09 DIAGNOSIS — M6281 Muscle weakness (generalized): Secondary | ICD-10-CM | POA: Diagnosis not present

## 2021-06-10 DIAGNOSIS — I482 Chronic atrial fibrillation, unspecified: Secondary | ICD-10-CM | POA: Diagnosis not present

## 2021-06-10 DIAGNOSIS — G8114 Spastic hemiplegia affecting left nondominant side: Secondary | ICD-10-CM | POA: Diagnosis not present

## 2021-06-14 DIAGNOSIS — M6281 Muscle weakness (generalized): Secondary | ICD-10-CM | POA: Diagnosis not present

## 2021-06-14 DIAGNOSIS — R2689 Other abnormalities of gait and mobility: Secondary | ICD-10-CM | POA: Diagnosis not present

## 2021-06-15 DIAGNOSIS — M6281 Muscle weakness (generalized): Secondary | ICD-10-CM | POA: Diagnosis not present

## 2021-06-15 DIAGNOSIS — R2689 Other abnormalities of gait and mobility: Secondary | ICD-10-CM | POA: Diagnosis not present

## 2021-06-16 DIAGNOSIS — R2689 Other abnormalities of gait and mobility: Secondary | ICD-10-CM | POA: Diagnosis not present

## 2021-06-16 DIAGNOSIS — M6281 Muscle weakness (generalized): Secondary | ICD-10-CM | POA: Diagnosis not present

## 2021-06-17 DIAGNOSIS — R2689 Other abnormalities of gait and mobility: Secondary | ICD-10-CM | POA: Diagnosis not present

## 2021-06-17 DIAGNOSIS — M6281 Muscle weakness (generalized): Secondary | ICD-10-CM | POA: Diagnosis not present

## 2021-06-18 DIAGNOSIS — M6281 Muscle weakness (generalized): Secondary | ICD-10-CM | POA: Diagnosis not present

## 2021-06-18 DIAGNOSIS — R2689 Other abnormalities of gait and mobility: Secondary | ICD-10-CM | POA: Diagnosis not present

## 2021-06-23 DIAGNOSIS — M6281 Muscle weakness (generalized): Secondary | ICD-10-CM | POA: Diagnosis not present

## 2021-06-23 DIAGNOSIS — R2689 Other abnormalities of gait and mobility: Secondary | ICD-10-CM | POA: Diagnosis not present

## 2021-06-24 DIAGNOSIS — M6281 Muscle weakness (generalized): Secondary | ICD-10-CM | POA: Diagnosis not present

## 2021-06-24 DIAGNOSIS — R2689 Other abnormalities of gait and mobility: Secondary | ICD-10-CM | POA: Diagnosis not present

## 2021-06-25 DIAGNOSIS — R2689 Other abnormalities of gait and mobility: Secondary | ICD-10-CM | POA: Diagnosis not present

## 2021-06-25 DIAGNOSIS — M6281 Muscle weakness (generalized): Secondary | ICD-10-CM | POA: Diagnosis not present

## 2021-06-28 DIAGNOSIS — R2689 Other abnormalities of gait and mobility: Secondary | ICD-10-CM | POA: Diagnosis not present

## 2021-06-28 DIAGNOSIS — M6281 Muscle weakness (generalized): Secondary | ICD-10-CM | POA: Diagnosis not present

## 2021-06-29 DIAGNOSIS — F413 Other mixed anxiety disorders: Secondary | ICD-10-CM | POA: Diagnosis not present

## 2021-06-29 DIAGNOSIS — F338 Other recurrent depressive disorders: Secondary | ICD-10-CM | POA: Diagnosis not present

## 2021-07-04 DIAGNOSIS — M6281 Muscle weakness (generalized): Secondary | ICD-10-CM | POA: Diagnosis not present

## 2021-07-04 DIAGNOSIS — R2689 Other abnormalities of gait and mobility: Secondary | ICD-10-CM | POA: Diagnosis not present

## 2021-07-07 DIAGNOSIS — Z23 Encounter for immunization: Secondary | ICD-10-CM | POA: Diagnosis not present

## 2021-07-28 DIAGNOSIS — F3189 Other bipolar disorder: Secondary | ICD-10-CM | POA: Diagnosis not present

## 2021-07-28 DIAGNOSIS — F338 Other recurrent depressive disorders: Secondary | ICD-10-CM | POA: Diagnosis not present

## 2021-07-28 DIAGNOSIS — F5101 Primary insomnia: Secondary | ICD-10-CM | POA: Diagnosis not present

## 2021-07-28 DIAGNOSIS — F413 Other mixed anxiety disorders: Secondary | ICD-10-CM | POA: Diagnosis not present

## 2021-07-31 DIAGNOSIS — F5101 Primary insomnia: Secondary | ICD-10-CM | POA: Diagnosis not present

## 2021-08-17 DIAGNOSIS — M6281 Muscle weakness (generalized): Secondary | ICD-10-CM | POA: Diagnosis not present

## 2021-08-23 DIAGNOSIS — Z23 Encounter for immunization: Secondary | ICD-10-CM | POA: Diagnosis not present

## 2021-08-26 DIAGNOSIS — G8114 Spastic hemiplegia affecting left nondominant side: Secondary | ICD-10-CM | POA: Diagnosis not present

## 2021-08-26 DIAGNOSIS — I482 Chronic atrial fibrillation, unspecified: Secondary | ICD-10-CM | POA: Diagnosis not present

## 2021-08-31 DIAGNOSIS — F5101 Primary insomnia: Secondary | ICD-10-CM | POA: Diagnosis not present

## 2021-08-31 DIAGNOSIS — F413 Other mixed anxiety disorders: Secondary | ICD-10-CM | POA: Diagnosis not present

## 2021-08-31 DIAGNOSIS — F338 Other recurrent depressive disorders: Secondary | ICD-10-CM | POA: Diagnosis not present

## 2021-08-31 DIAGNOSIS — F3189 Other bipolar disorder: Secondary | ICD-10-CM | POA: Diagnosis not present

## 2021-09-03 DIAGNOSIS — F5101 Primary insomnia: Secondary | ICD-10-CM | POA: Diagnosis not present

## 2021-09-03 DIAGNOSIS — F3189 Other bipolar disorder: Secondary | ICD-10-CM | POA: Diagnosis not present

## 2021-09-08 DIAGNOSIS — F338 Other recurrent depressive disorders: Secondary | ICD-10-CM | POA: Diagnosis not present

## 2021-09-08 DIAGNOSIS — F418 Other specified anxiety disorders: Secondary | ICD-10-CM | POA: Diagnosis not present

## 2021-09-26 DIAGNOSIS — F5101 Primary insomnia: Secondary | ICD-10-CM | POA: Diagnosis not present

## 2021-09-26 DIAGNOSIS — F338 Other recurrent depressive disorders: Secondary | ICD-10-CM | POA: Diagnosis not present

## 2021-09-26 DIAGNOSIS — F413 Other mixed anxiety disorders: Secondary | ICD-10-CM | POA: Diagnosis not present
